# Patient Record
Sex: Female | Born: 1985
Health system: Southern US, Community
[De-identification: ages and names within clinical notes are randomized; demographics above are authoritative.]

## PROBLEM LIST (undated history)

## (undated) DIAGNOSIS — G8929 Other chronic pain: Secondary | ICD-10-CM

## (undated) DIAGNOSIS — M797 Fibromyalgia: Secondary | ICD-10-CM

## (undated) DIAGNOSIS — B999 Unspecified infectious disease: Secondary | ICD-10-CM

## (undated) DIAGNOSIS — I10 Essential (primary) hypertension: Secondary | ICD-10-CM

## (undated) DIAGNOSIS — F319 Bipolar disorder, unspecified: Secondary | ICD-10-CM

## (undated) DIAGNOSIS — F32A Depression, unspecified: Secondary | ICD-10-CM

## (undated) DIAGNOSIS — IMO0002 Reserved for concepts with insufficient information to code with codable children: Secondary | ICD-10-CM

## (undated) DIAGNOSIS — M329 Systemic lupus erythematosus, unspecified: Secondary | ICD-10-CM

## (undated) DIAGNOSIS — M1990 Unspecified osteoarthritis, unspecified site: Secondary | ICD-10-CM

## (undated) DIAGNOSIS — D709 Neutropenia, unspecified: Secondary | ICD-10-CM

## (undated) DIAGNOSIS — F419 Anxiety disorder, unspecified: Secondary | ICD-10-CM

## (undated) DIAGNOSIS — F1120 Opioid dependence, uncomplicated: Secondary | ICD-10-CM

## (undated) HISTORY — DX: Opioid dependence, uncomplicated: F11.20

## (undated) HISTORY — DX: Bipolar disorder, unspecified: F31.9

## (undated) HISTORY — PX: WISDOM TOOTH EXTRACTION: SHX21

---

## 2012-09-15 ENCOUNTER — Emergency Department (HOSPITAL_COMMUNITY): Payer: Self-pay

## 2012-09-15 ENCOUNTER — Emergency Department (HOSPITAL_COMMUNITY)
Admission: EM | Admit: 2012-09-15 | Discharge: 2012-09-15 | Disposition: A | Discharge status: Home / Self Care | Payer: Self-pay | Attending: Emergency Medicine | Admitting: Emergency Medicine

## 2012-09-15 ENCOUNTER — Encounter (HOSPITAL_COMMUNITY): Payer: Self-pay | Admitting: *Deleted

## 2012-09-15 DIAGNOSIS — Z8739 Personal history of other diseases of the musculoskeletal system and connective tissue: Secondary | ICD-10-CM | POA: Insufficient documentation

## 2012-09-15 DIAGNOSIS — R21 Rash and other nonspecific skin eruption: Secondary | ICD-10-CM | POA: Insufficient documentation

## 2012-09-15 DIAGNOSIS — Z87891 Personal history of nicotine dependence: Secondary | ICD-10-CM | POA: Insufficient documentation

## 2012-09-15 DIAGNOSIS — M329 Systemic lupus erythematosus, unspecified: Secondary | ICD-10-CM | POA: Insufficient documentation

## 2012-09-15 HISTORY — DX: Reserved for concepts with insufficient information to code with codable children: IMO0002

## 2012-09-15 HISTORY — DX: Systemic lupus erythematosus, unspecified: M32.9

## 2012-09-15 HISTORY — DX: Unspecified osteoarthritis, unspecified site: M19.90

## 2012-09-15 LAB — URINALYSIS, ROUTINE W REFLEX MICROSCOPIC
Bilirubin Urine: NEGATIVE
Glucose, UA: NEGATIVE mg/dL
Hgb urine dipstick: NEGATIVE
Ketones, ur: NEGATIVE mg/dL
Leukocytes, UA: NEGATIVE
Nitrite: NEGATIVE
Protein, ur: NEGATIVE mg/dL
Specific Gravity, Urine: >1.03 — ABNORMAL HIGH (ref 1.005–1.030)
Urobilinogen, UA: 0.2 mg/dL (ref 0.0–1.0)
pH: 7 (ref 5.0–8.0)

## 2012-09-15 LAB — BASIC METABOLIC PANEL
BUN: 13 mg/dL (ref 6–23)
CO2: 27 mEq/L (ref 19–32)
Calcium: 10.1 mg/dL (ref 8.4–10.5)
Chloride: 100 mEq/L (ref 96–112)
Creatinine, Ser: 0.7 mg/dL (ref 0.50–1.10)
GFR calc Af Amer: >90 mL/min (ref >90)
GFR calc non Af Amer: >90 mL/min (ref >90)
Glucose, Bld: 113 mg/dL — ABNORMAL HIGH (ref 70–99)
Potassium: 3.8 mEq/L (ref 3.5–5.1)
Sodium: 138 mEq/L (ref 135–145)

## 2012-09-15 LAB — LIPASE, BLOOD: Lipase: 21 U/L (ref 11–59)

## 2012-09-15 LAB — CBC WITH DIFFERENTIAL/PLATELET
Basophils Absolute: 0 10*3/uL (ref 0.0–0.1)
Basophils Relative: 0 % (ref 0–1)
Eosinophils Absolute: 0 10*3/uL (ref 0.0–0.7)
Eosinophils Relative: 0 % (ref 0–5)
HCT: 41.9 % (ref 36.0–46.0)
Hemoglobin: 14.2 g/dL (ref 12.0–15.0)
Lymphocytes Relative: 32 % (ref 12–46)
Lymphs Abs: 0.6 10*3/uL — ABNORMAL LOW (ref 0.7–4.0)
MCH: 29 pg (ref 26.0–34.0)
MCHC: 33.9 g/dL (ref 30.0–36.0)
MCV: 85.7 fL (ref 78.0–100.0)
Monocytes Absolute: 0.3 10*3/uL (ref 0.1–1.0)
Monocytes Relative: 14 % — ABNORMAL HIGH (ref 3–12)
Neutro Abs: 1 10*3/uL — ABNORMAL LOW (ref 1.7–7.7)
Neutrophils Relative %: 54 % (ref 43–77)
Platelets: 232 10*3/uL (ref 150–400)
RBC: 4.89 MIL/uL (ref 3.87–5.11)
RDW: 12.2 % (ref 11.5–15.5)
WBC: 1.9 10*3/uL — ABNORMAL LOW (ref 4.0–10.5)

## 2012-09-15 LAB — PREGNANCY, URINE: Preg Test, Ur: NEGATIVE

## 2012-09-15 MED ORDER — PANTOPRAZOLE SODIUM 40 MG PO TBEC
40.0000 mg | DELAYED_RELEASE_TABLET | Freq: Once | ORAL | Status: AC
  Administered 2012-09-15: 40 mg via ORAL
  Filled 2012-09-15: qty 1

## 2012-09-15 MED ORDER — METHYLPREDNISOLONE SODIUM SUCC 125 MG IJ SOLR
125.0000 mg | Freq: Once | INTRAMUSCULAR | Status: AC
  Administered 2012-09-15: 125 mg via INTRAVENOUS
  Filled 2012-09-15: qty 2

## 2012-09-15 MED ORDER — SODIUM CHLORIDE 0.9 % IV BOLUS (SEPSIS)
1000.0000 mL | Freq: Once | INTRAVENOUS | Status: AC
  Administered 2012-09-15: 1000 mL via INTRAVENOUS

## 2012-09-15 MED ORDER — OXYCODONE-ACETAMINOPHEN 5-325 MG PO TABS
2.0000 | ORAL_TABLET | Freq: Once | ORAL | Status: AC
  Administered 2012-09-15: 2 via ORAL
  Filled 2012-09-15: qty 2

## 2012-09-15 MED ORDER — PREDNISONE 10 MG PO TABS: 20.0000 mg | ORAL_TABLET | Freq: Every day | ORAL | Status: DC

## 2012-09-15 NOTE — ED Notes (Signed)
Pt c/o flare up of lupus. Pt states she has been out of her meds for a while and this has come on quickly. Pts face swollen with red areas to face. Pt c/o severe pain all over.

## 2012-09-15 NOTE — ED Provider Notes (Signed)
History     CSN: [id number]  Arrival date & time 09/15/12  4098   First MD Initiated Contact with Patient 09/15/12 1929      Chief Complaint  Patient presents with  . Lupus    (Consider location/radiation/quality/duration/timing/severity/associated sxs/prior treatment) HPI Patient with rash to face, hands, knuckles swollen, alopecia at baseline when not taking meds.  Patient states she has not been taking prednisone and plaquenil at full dose for a week and now out of meds for two days.  Patient from out of town and has been living in [place].  She denies fever, cough, abdominal pain, nausea vomiting or diarrhea.  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  Normal menses with last normal October.  Denies birth control or sexual activity since unknown.  Patient states she has some dyspnea, joints all swollen, and chills and facial rash and hand rash worse.  Patient diagnosed with lupus in 2006.   Past Medical History  Diagnosis Date  . Lupus   . Arthritis     Past Surgical History  Procedure Date  . Cesarean section     History reviewed. No pertinent family history.  History  Substance Use Topics  . Smoking status: Former Games developer  . Smokeless tobacco: Not on file  . Alcohol Use: No    OB History    Grav Para Term Preterm Abortions TAB SAB Ect Mult Living                  Review of Systems  Constitutional: Positive for chills. Negative for fever, activity change, appetite change and unexpected weight change.  HENT: Negative for sore throat, rhinorrhea, neck pain, neck stiffness and sinus pressure.   Eyes: Negative for visual disturbance.  Respiratory: Positive for shortness of breath. Negative for cough.   Cardiovascular: Negative for chest pain and leg swelling.  Gastrointestinal: Negative for vomiting, abdominal pain, diarrhea and blood in stool.  Genitourinary: Negative for dysuria, urgency, frequency, vaginal discharge and difficulty urinating.  Musculoskeletal:  Positive for joint swelling. Negative for myalgias, arthralgias and gait problem.  Skin: Positive for rash. Negative for color change.  Neurological: Negative for weakness, light-headedness and headaches.  Hematological: Does not bruise/bleed easily.  Psychiatric/Behavioral: Negative for dysphoric mood.    Allergies  Sulfa antibiotics  Home Medications  No current outpatient prescriptions on file.  BP 167/81  Pulse 111  Temp 98.7 F (37.1 C) (Oral)  Resp 16  Ht 5\' 3"  (1.6 m)  Wt 130 lb (58.968 kg)  BMI 23.03 kg/m2  SpO2 100%  LMP 07/30/2012  Physical Exam  Nursing note and vitals reviewed. Constitutional: She appears well-developed and well-nourished.  HENT:  Head: Normocephalic and atraumatic.  Eyes: Conjunctivae normal and EOM are normal. Pupils are equal, round, and reactive to light.  Neck: Normal range of motion. Neck supple.  Cardiovascular: Normal rate, regular rhythm, normal heart sounds and intact distal pulses.   Pulmonary/Chest: Effort normal and breath sounds normal.  Abdominal: Soft. Bowel sounds are normal.  Musculoskeletal: Normal range of motion.  Neurological: She is alert.  Skin: Skin is warm and dry.       Erythematous facial rash across nose bilateral cheeks with some central clearing on the plaque the left cheek.  Psychiatric: She has a normal mood and affect. Thought content normal.    ED Course  Procedures (including critical care time)  Labs Reviewed - No data to display No results found.   No diagnosis found.  Results for orders placed during  the hospital encounter of 09/15/12  CBC WITH DIFFERENTIAL      Component Value Range   WBC 1.9 (*) 4.0 - 10.5 K/uL   RBC 4.89  3.87 - 5.11 MIL/uL   Hemoglobin 14.2  12.0 - 15.0 g/dL   HCT 16.1  09.6 - 04.5 %   MCV 85.7  78.0 - 100.0 fL   MCH 29.0  26.0 - 34.0 pg   MCHC 33.9  30.0 - 36.0 g/dL   RDW 40.9  81.1 - 91.4 %   Platelets 232  150 - 400 K/uL   Neutrophils Relative 54  43 - 77 %    Neutro Abs 1.0 (*) 1.7 - 7.7 K/uL   Lymphocytes Relative 32  12 - 46 %   Lymphs Abs 0.6 (*) 0.7 - 4.0 K/uL   Monocytes Relative 14 (*) 3 - 12 %   Monocytes Absolute 0.3  0.1 - 1.0 K/uL   Eosinophils Relative 0  0 - 5 %   Eosinophils Absolute 0.0  0.0 - 0.7 K/uL   Basophils Relative 0  0 - 1 %   Basophils Absolute 0.0  0.0 - 0.1 K/uL  BASIC METABOLIC PANEL      Component Value Range   Sodium 138  135 - 145 mEq/L   Potassium 3.8  3.5 - 5.1 mEq/L   Chloride 100  96 - 112 mEq/L   CO2 27  19 - 32 mEq/L   Glucose, Bld 113 (*) 70 - 99 mg/dL   BUN 13  6 - 23 mg/dL   Creatinine, Ser 7.82  0.50 - 1.10 mg/dL   Calcium 95.6  8.4 - 21.3 mg/dL   GFR calc non Af Amer >90  >90 mL/min   GFR calc Af Amer >90  >90 mL/min  URINALYSIS, ROUTINE W REFLEX MICROSCOPIC      Component Value Range   Color, Urine YELLOW  YELLOW   APPearance CLEAR  CLEAR   Specific Gravity, Urine >1.030 (*) 1.005 - 1.030   pH 7.0  5.0 - 8.0   Glucose, UA NEGATIVE  NEGATIVE mg/dL   Hgb urine dipstick NEGATIVE  NEGATIVE   Bilirubin Urine NEGATIVE  NEGATIVE   Ketones, ur NEGATIVE  NEGATIVE mg/dL   Protein, ur NEGATIVE  NEGATIVE mg/dL   Urobilinogen, UA 0.2  0.0 - 1.0 mg/dL   Nitrite NEGATIVE  NEGATIVE   Leukocytes, UA NEGATIVE  NEGATIVE  PREGNANCY, URINE      Component Value Range   Preg Test, Ur NEGATIVE  NEGATIVE  LIPASE, BLOOD      Component Value Range   Lipase 21  11 - 59 U/L     MDM  Lupus with no medication for several days and partial therapy for weeks prior.  She has decreased wbc which I suspect is secondary to plaquenil ,but adequate neutrophil count.  She is restarted on prednisone but plaquenil not restarted until assured pmd will be able to monitor cbc.  Patient referred to Dr. Lodema Hong, on call for medicine.          Hilario Quarry, MD 09/15/12 2135

## 2012-11-28 ENCOUNTER — Other Ambulatory Visit (HOSPITAL_COMMUNITY): Payer: Self-pay | Admitting: Nurse Practitioner

## 2012-11-28 DIAGNOSIS — N6489 Other specified disorders of breast: Secondary | ICD-10-CM

## 2012-11-28 DIAGNOSIS — N9489 Other specified conditions associated with female genital organs and menstrual cycle: Secondary | ICD-10-CM

## 2012-11-28 DIAGNOSIS — R590 Localized enlarged lymph nodes: Secondary | ICD-10-CM

## 2012-12-05 ENCOUNTER — Ambulatory Visit (HOSPITAL_COMMUNITY)
Admission: RE | Admit: 2012-12-05 | Discharge: 2012-12-05 | Disposition: A | Discharge status: Home / Self Care | Payer: Self-pay | Source: Ambulatory Visit | Attending: Family Medicine | Admitting: Family Medicine

## 2012-12-05 ENCOUNTER — Ambulatory Visit (HOSPITAL_COMMUNITY): Payer: Self-pay

## 2012-12-05 DIAGNOSIS — N926 Irregular menstruation, unspecified: Secondary | ICD-10-CM | POA: Insufficient documentation

## 2012-12-05 DIAGNOSIS — N949 Unspecified condition associated with female genital organs and menstrual cycle: Secondary | ICD-10-CM | POA: Insufficient documentation

## 2012-12-05 DIAGNOSIS — N9489 Other specified conditions associated with female genital organs and menstrual cycle: Secondary | ICD-10-CM

## 2012-12-05 DIAGNOSIS — R590 Localized enlarged lymph nodes: Secondary | ICD-10-CM

## 2012-12-05 DIAGNOSIS — R599 Enlarged lymph nodes, unspecified: Secondary | ICD-10-CM | POA: Insufficient documentation

## 2012-12-05 DIAGNOSIS — N6489 Other specified disorders of breast: Secondary | ICD-10-CM | POA: Insufficient documentation

## 2013-04-04 ENCOUNTER — Encounter (HOSPITAL_COMMUNITY): Payer: Self-pay | Admitting: Emergency Medicine

## 2013-04-04 ENCOUNTER — Emergency Department (HOSPITAL_COMMUNITY)
Admission: EM | Admit: 2013-04-04 | Discharge: 2013-04-05 | Disposition: A | Discharge status: Home / Self Care | Payer: Managed Care, Other (non HMO) | Attending: Emergency Medicine | Admitting: Emergency Medicine

## 2013-04-04 DIAGNOSIS — D709 Neutropenia, unspecified: Secondary | ICD-10-CM | POA: Insufficient documentation

## 2013-04-04 DIAGNOSIS — R21 Rash and other nonspecific skin eruption: Secondary | ICD-10-CM | POA: Insufficient documentation

## 2013-04-04 DIAGNOSIS — M2550 Pain in unspecified joint: Secondary | ICD-10-CM | POA: Insufficient documentation

## 2013-04-04 DIAGNOSIS — M542 Cervicalgia: Secondary | ICD-10-CM | POA: Insufficient documentation

## 2013-04-04 DIAGNOSIS — G8929 Other chronic pain: Secondary | ICD-10-CM | POA: Insufficient documentation

## 2013-04-04 DIAGNOSIS — Z3202 Encounter for pregnancy test, result negative: Secondary | ICD-10-CM | POA: Insufficient documentation

## 2013-04-04 DIAGNOSIS — Z8739 Personal history of other diseases of the musculoskeletal system and connective tissue: Secondary | ICD-10-CM | POA: Insufficient documentation

## 2013-04-04 DIAGNOSIS — Z79899 Other long term (current) drug therapy: Secondary | ICD-10-CM | POA: Insufficient documentation

## 2013-04-04 DIAGNOSIS — Z87891 Personal history of nicotine dependence: Secondary | ICD-10-CM | POA: Insufficient documentation

## 2013-04-04 DIAGNOSIS — M329 Systemic lupus erythematosus, unspecified: Secondary | ICD-10-CM | POA: Insufficient documentation

## 2013-04-04 LAB — COMPREHENSIVE METABOLIC PANEL
ALT: 20 U/L (ref 0–35)
AST: 25 U/L (ref 0–37)
Albumin: 3.4 g/dL — ABNORMAL LOW (ref 3.5–5.2)
Alkaline Phosphatase: 40 U/L (ref 39–117)
BUN: 17 mg/dL (ref 6–23)
CO2: 26 mEq/L (ref 19–32)
Calcium: 8.4 mg/dL (ref 8.4–10.5)
Chloride: 109 mEq/L (ref 96–112)
Creatinine, Ser: 0.5 mg/dL (ref 0.50–1.10)
GFR calc Af Amer: >90 mL/min (ref >90)
GFR calc non Af Amer: >90 mL/min (ref >90)
Glucose, Bld: 87 mg/dL (ref 70–99)
Potassium: 3.2 mEq/L — ABNORMAL LOW (ref 3.5–5.1)
Sodium: 141 mEq/L (ref 135–145)
Total Bilirubin: 0.2 mg/dL — ABNORMAL LOW (ref 0.3–1.2)
Total Protein: 6.1 g/dL (ref 6.0–8.3)

## 2013-04-04 LAB — CBC WITH DIFFERENTIAL/PLATELET
Basophils Absolute: 0 10*3/uL (ref 0.0–0.1)
Basophils Relative: 1 % (ref 0–1)
Eosinophils Absolute: 0 10*3/uL (ref 0.0–0.7)
Eosinophils Relative: 1 % (ref 0–5)
HCT: 34.6 % — ABNORMAL LOW (ref 36.0–46.0)
Hemoglobin: 11.4 g/dL — ABNORMAL LOW (ref 12.0–15.0)
Lymphocytes Relative: 52 % — ABNORMAL HIGH (ref 12–46)
Lymphs Abs: 0.9 10*3/uL (ref 0.7–4.0)
MCH: 28.4 pg (ref 26.0–34.0)
MCHC: 32.9 g/dL (ref 30.0–36.0)
MCV: 86.3 fL (ref 78.0–100.0)
Monocytes Absolute: 0.3 10*3/uL (ref 0.1–1.0)
Monocytes Relative: 19 % — ABNORMAL HIGH (ref 3–12)
Neutro Abs: 0.5 10*3/uL — ABNORMAL LOW (ref 1.7–7.7)
Neutrophils Relative %: 27 % — ABNORMAL LOW (ref 43–77)
Platelets: 155 10*3/uL (ref 150–400)
RBC: 4.01 MIL/uL (ref 3.87–5.11)
RDW: 12.8 % (ref 11.5–15.5)
WBC: 1.7 10*3/uL — ABNORMAL LOW (ref 4.0–10.5)

## 2013-04-04 LAB — URINALYSIS, ROUTINE W REFLEX MICROSCOPIC
Glucose, UA: NEGATIVE mg/dL
Hgb urine dipstick: NEGATIVE
Leukocytes, UA: NEGATIVE
Nitrite: NEGATIVE
Protein, ur: NEGATIVE mg/dL
Specific Gravity, Urine: 1.02 (ref 1.005–1.030)
Urobilinogen, UA: 0.2 mg/dL (ref 0.0–1.0)
pH: 7 (ref 5.0–8.0)

## 2013-04-04 LAB — LACTIC ACID, PLASMA: Lactic Acid, Venous: 0.5 mmol/L (ref 0.5–2.2)

## 2013-04-04 LAB — PREGNANCY, URINE: Preg Test, Ur: NEGATIVE

## 2013-04-04 MED ORDER — PREDNISONE 10 MG PO TABS
60.0000 mg | ORAL_TABLET | Freq: Once | ORAL | Status: AC
  Administered 2013-04-04: 60 mg via ORAL
  Filled 2013-04-04 (×2): qty 1

## 2013-04-04 MED ORDER — HYDROXYCHLOROQUINE SULFATE 200 MG PO TABS: 400.0000 mg | ORAL_TABLET | Freq: Every day | ORAL | Status: DC

## 2013-04-04 MED ORDER — SODIUM CHLORIDE 0.9 % IV SOLN
INTRAVENOUS | Status: DC
  Administered 2013-04-04: 23:00:00 via INTRAVENOUS

## 2013-04-04 MED ORDER — OXYCODONE-ACETAMINOPHEN 5-325 MG PO TABS: 1.0000 | ORAL_TABLET | Freq: Four times a day (QID) | ORAL | Status: DC | PRN

## 2013-04-04 MED ORDER — OXYCODONE-ACETAMINOPHEN 5-325 MG PO TABS
2.0000 | ORAL_TABLET | Freq: Once | ORAL | Status: AC
  Administered 2013-04-05: 2 via ORAL
  Filled 2013-04-04: qty 2

## 2013-04-04 MED ORDER — PREDNISONE 20 MG PO TABS: ORAL_TABLET | ORAL | Status: DC

## 2013-04-04 MED ORDER — SODIUM CHLORIDE 0.9 % IV BOLUS (SEPSIS)
2000.0000 mL | Freq: Once | INTRAVENOUS | Status: AC
  Administered 2013-04-04: 2000 mL via INTRAVENOUS

## 2013-04-04 MED ORDER — HYDROMORPHONE HCL PF 1 MG/ML IJ SOLN
1.0000 mg | Freq: Once | INTRAMUSCULAR | Status: AC
  Administered 2013-04-04: 1 mg via INTRAVENOUS
  Filled 2013-04-04 (×2): qty 1

## 2013-04-04 MED ORDER — FENTANYL CITRATE 0.05 MG/ML IJ SOLN
INTRAMUSCULAR | Status: AC
  Administered 2013-04-04: 50 ug
  Filled 2013-04-04: qty 2

## 2013-04-04 NOTE — ED Notes (Signed)
Patient crying and reporting severe pain in triage.

## 2013-04-04 NOTE — ED Notes (Signed)
Patient reports has history of lupus. States hasn't taken plaquenil in over a week. Complaining of generalized pain all over.

## 2013-04-04 NOTE — ED Provider Notes (Signed)
History  This chart was scribed for Hurman Horn, MD by Manuela Schwartz, ED scribe. This patient was seen in room [facility] and the patient's care was started at 2021.  CSN: [id number] Arrival date & time 04/04/13  2021  None    Chief Complaint  Patient presents with  . Joint Pain  . Lupus   The history is provided by the patient. No language interpreter was used.   HPI Comments: Wendy Herman is a 27 y.o. female who presents to the Emergency Department w/hx of lupus complaining of out of her normal daily plaquenil, steroid, and pain medicine and now with severe, diffuse joint pain and back/neck pain which is worse than baseline. She states this episode of her lupus flare up is similar to previous episodes w/diffuse joint and extremity pain. She normally takes plaquenil, prednisone, and pain medicine daily for her lupus. She currenlty has no pain management doctor. She states normally takes Vicodin or percocet to control her pain. She states an associated red rash over her face which is better to previous rashes. She states associated confusion and some difficulty with short term working memory but she is oriented. She denies associated fever, abdominal pain, CP, SOB, cough, bloody stools.    Past Medical History  Diagnosis Date  . Lupus   . Arthritis    Past Surgical History  Procedure Laterality Date  . Cesarean section     History reviewed. No pertinent family history. History  Substance Use Topics  . Smoking status: Former Games developer  . Smokeless tobacco: Not on file  . Alcohol Use: No   OB History   Grav Para Term Preterm Abortions TAB SAB Ect Mult Living                 Review of Systems  Constitutional: Negative for fever and chills.  HENT: Positive for neck pain. Negative for congestion, sore throat and rhinorrhea.   Respiratory: Negative for cough and shortness of breath.   Cardiovascular: Negative for chest pain.  Gastrointestinal: Negative for nausea, vomiting,  abdominal pain and diarrhea.  Musculoskeletal: Positive for back pain and joint swelling.  Skin: Positive for rash (Diffuse red rash over her face). Negative for color change.  Neurological: Negative for weakness.  All other systems reviewed and are negative.  A complete 10 system review of systems was obtained and all systems are negative except as noted in the HPI and PMH.    Allergies  Sulfa antibiotics  Home Medications   Current Outpatient Rx  Name  Route  Sig  Dispense  Refill  . alum & mag hydroxide-simeth (MAALOX/MYLANTA) 200-200-20 MG/5ML suspension   Oral   Take 15 mLs by mouth daily.         . predniSONE (DELTASONE) 10 MG tablet   Oral   Take 10-15 mg by mouth at bedtime.         . hydroxychloroquine (PLAQUENIL) 200 MG tablet   Oral   Take 400 mg by mouth daily.         . hydroxychloroquine (PLAQUENIL) 200 MG tablet   Oral   Take 2 tablets (400 mg total) by mouth daily.   30 tablet   0   . omeprazole (PRILOSEC) 20 MG capsule   Oral   Take 20 mg by mouth daily. Acid Reflux         . oxyCODONE-acetaminophen (PERCOCET) 5-325 MG per tablet   Oral   Take 1 tablet by mouth every 6 (six) hours  as needed for pain.   30 tablet   0   . predniSONE (DELTASONE) 20 MG tablet      2 tabs po daily x 6 days, then 1.5 tabs x 3 days, then 1 tab x 3 days, then 0.5 tabs daily   30 tablet   0    Triage Vitals: BP 121/101  Pulse 78  Temp(Src) 98.5 F (36.9 C) (Oral)  Resp 20  Ht 5\' 3"  (1.6 m)  Wt 120 lb (54.432 kg)  BMI 21.26 kg/m2  SpO2 98%  LMP 11/15/2012 Physical Exam  Nursing note and vitals reviewed. Constitutional: She is oriented to person, place, and time. She appears well-developed and well-nourished. No distress.  Awake, alert, nontoxic appearance.  HENT:  Head: Normocephalic and atraumatic.  Eyes: EOM are normal. Right eye exhibits no discharge. Left eye exhibits no discharge.  Neck: Neck supple. No tracheal deviation present.  Diffuse  cervical spine tenderness  Cardiovascular: Normal rate, regular rhythm and normal heart sounds.   No murmur heard. Pulmonary/Chest: Effort normal and breath sounds normal. No respiratory distress. She has no wheezes. She has no rales. She exhibits no tenderness.  Abdominal: Soft. Bowel sounds are normal. She exhibits no distension and no mass. There is no tenderness. There is no rebound and no guarding.  Musculoskeletal: Normal range of motion. She exhibits no tenderness.  Baseline ROM, no obvious new focal weakness. Diffuse back tenderness and diffuse tenderness to all extremities and joints.   Neurological: She is alert and oriented to person, place, and time.  Mental status and motor strength appears baseline for patient and situation.  Skin: Skin is warm and dry. No rash noted.  Psychiatric: She has a normal mood and affect. Her behavior is normal.    ED Course  Procedures (including critical care time) DIAGNOSTIC STUDIES: Oxygen Saturation is 98% on room air, normal by my interpretation.    COORDINATION OF CARE: At 1051 Discussed treatment plan with patient which includes IV fluids, pain medicine, blood work. Patient agrees.   D/w [facility] Rheum recs re-start Plaquenil and start taper prednisone at 40mg  taper to 10mg  daily by 2 weeks, re-start chronic narcotics, f/u [facility] Rheum within 2 weeks. Pt feels improved after observation and/or treatment in ED.Patient / Family / Caregiver informed of clinical course, understand medical decision-making process, and agree with plan.[id number]  Labs Reviewed  CBC WITH DIFFERENTIAL - Abnormal; Notable for the following:    WBC 1.7 (*)    Hemoglobin 11.4 (*)    HCT 34.6 (*)    Neutrophils Relative % 27 (*)    Lymphocytes Relative 52 (*)    Monocytes Relative 19 (*)    Neutro Abs 0.5 (*)    All other components within normal limits  COMPREHENSIVE METABOLIC PANEL - Abnormal; Notable for the following:    Potassium 3.2 (*)    Albumin 3.4 (*)    Total  Bilirubin 0.2 (*)    All other components within normal limits  URINALYSIS, ROUTINE W REFLEX MICROSCOPIC - Abnormal; Notable for the following:    Color, Urine BROWN (*)    Bilirubin Urine SMALL (*)    Ketones, ur TRACE (*)    All other components within normal limits  PREGNANCY, URINE  LACTIC ACID, PLASMA   No results found. 1. SLE exacerbation   2. Chronic pain   3. Neutropenia     MDM  I personally performed the services described in this documentation, which was scribed in my presence. The recorded information  has been reviewed and is accurate. I doubt any other EMC precluding discharge at this time including, but not necessarily limited to the following:SBI.   Hurman Horn, MD 04/05/13 [phone]

## 2013-04-04 NOTE — ED Notes (Signed)
Patient is very tearful and stating that she is in a lot of pain at this time.

## 2014-07-27 ENCOUNTER — Emergency Department (HOSPITAL_COMMUNITY)
Admission: EM | Admit: 2014-07-27 | Discharge: 2014-07-27 | Disposition: A | Discharge status: Home / Self Care | Payer: Medicaid Other | Attending: Emergency Medicine | Admitting: Emergency Medicine

## 2014-07-27 ENCOUNTER — Encounter (HOSPITAL_COMMUNITY): Payer: Self-pay | Admitting: Emergency Medicine

## 2014-07-27 DIAGNOSIS — F42 Obsessive-compulsive disorder: Secondary | ICD-10-CM | POA: Diagnosis not present

## 2014-07-27 DIAGNOSIS — D72819 Decreased white blood cell count, unspecified: Secondary | ICD-10-CM | POA: Insufficient documentation

## 2014-07-27 DIAGNOSIS — Z3202 Encounter for pregnancy test, result negative: Secondary | ICD-10-CM | POA: Diagnosis not present

## 2014-07-27 DIAGNOSIS — G8929 Other chronic pain: Secondary | ICD-10-CM | POA: Insufficient documentation

## 2014-07-27 DIAGNOSIS — Z7952 Long term (current) use of systemic steroids: Secondary | ICD-10-CM | POA: Insufficient documentation

## 2014-07-27 DIAGNOSIS — Z79899 Other long term (current) drug therapy: Secondary | ICD-10-CM | POA: Diagnosis not present

## 2014-07-27 DIAGNOSIS — Z87891 Personal history of nicotine dependence: Secondary | ICD-10-CM | POA: Insufficient documentation

## 2014-07-27 DIAGNOSIS — M1990 Unspecified osteoarthritis, unspecified site: Secondary | ICD-10-CM | POA: Insufficient documentation

## 2014-07-27 DIAGNOSIS — M329 Systemic lupus erythematosus, unspecified: Secondary | ICD-10-CM

## 2014-07-27 DIAGNOSIS — M799 Soft tissue disorder, unspecified: Secondary | ICD-10-CM | POA: Insufficient documentation

## 2014-07-27 DIAGNOSIS — M3210 Systemic lupus erythematosus, organ or system involvement unspecified: Secondary | ICD-10-CM | POA: Insufficient documentation

## 2014-07-27 DIAGNOSIS — M791 Myalgia: Secondary | ICD-10-CM | POA: Diagnosis not present

## 2014-07-27 DIAGNOSIS — R748 Abnormal levels of other serum enzymes: Secondary | ICD-10-CM

## 2014-07-27 HISTORY — DX: Fibromyalgia: M79.7

## 2014-07-27 HISTORY — DX: Neutropenia, unspecified: D70.9

## 2014-07-27 HISTORY — DX: Other chronic pain: G89.29

## 2014-07-27 LAB — COMPREHENSIVE METABOLIC PANEL
ALT: 45 U/L — ABNORMAL HIGH (ref 0–35)
AST: 38 U/L — ABNORMAL HIGH (ref 0–37)
Albumin: 3.8 g/dL (ref 3.5–5.2)
Alkaline Phosphatase: 60 U/L (ref 39–117)
Anion gap: 9 (ref 5–15)
BUN: 16 mg/dL (ref 6–23)
CO2: 31 mEq/L (ref 19–32)
Calcium: 9.3 mg/dL (ref 8.4–10.5)
Chloride: 101 mEq/L (ref 96–112)
Creatinine, Ser: 0.57 mg/dL (ref 0.50–1.10)
GFR calc Af Amer: >90 mL/min (ref >90)
GFR calc non Af Amer: >90 mL/min (ref >90)
Glucose, Bld: 92 mg/dL (ref 70–99)
Potassium: 4.3 mEq/L (ref 3.7–5.3)
Sodium: 141 mEq/L (ref 137–147)
Total Bilirubin: 0.3 mg/dL (ref 0.3–1.2)
Total Protein: 7.5 g/dL (ref 6.0–8.3)

## 2014-07-27 LAB — CBC WITH DIFFERENTIAL/PLATELET
Basophils Absolute: 0 10*3/uL (ref 0.0–0.1)
Basophils Relative: 0 % (ref 0–1)
Eosinophils Absolute: 0.1 10*3/uL (ref 0.0–0.7)
Eosinophils Relative: 4 % (ref 0–5)
HCT: 37.7 % (ref 36.0–46.0)
Hemoglobin: 12.4 g/dL (ref 12.0–15.0)
Lymphocytes Relative: 38 % (ref 12–46)
Lymphs Abs: 0.8 10*3/uL (ref 0.7–4.0)
MCH: 29.5 pg (ref 26.0–34.0)
MCHC: 32.9 g/dL (ref 30.0–36.0)
MCV: 89.5 fL (ref 78.0–100.0)
Monocytes Absolute: 0.3 10*3/uL (ref 0.1–1.0)
Monocytes Relative: 15 % — ABNORMAL HIGH (ref 3–12)
Neutro Abs: 0.9 10*3/uL — ABNORMAL LOW (ref 1.7–7.7)
Neutrophils Relative %: 43 % (ref 43–77)
Platelets: 211 10*3/uL (ref 150–400)
RBC: 4.21 MIL/uL (ref 3.87–5.11)
RDW: 14.7 % (ref 11.5–15.5)
WBC: 2.1 10*3/uL — ABNORMAL LOW (ref 4.0–10.5)

## 2014-07-27 LAB — URINALYSIS, ROUTINE W REFLEX MICROSCOPIC
Bilirubin Urine: NEGATIVE
Glucose, UA: NEGATIVE mg/dL
Hgb urine dipstick: NEGATIVE
Ketones, ur: NEGATIVE mg/dL
Leukocytes, UA: NEGATIVE
Nitrite: NEGATIVE
Protein, ur: NEGATIVE mg/dL
Specific Gravity, Urine: 1.02 (ref 1.005–1.030)
Urobilinogen, UA: 1 mg/dL (ref 0.0–1.0)
pH: 7 (ref 5.0–8.0)

## 2014-07-27 LAB — PREGNANCY, URINE: Preg Test, Ur: NEGATIVE

## 2014-07-27 MED ORDER — HYDROXYCHLOROQUINE SULFATE 200 MG PO TABS
400.0000 mg | ORAL_TABLET | Freq: Every day | ORAL | Status: DC
  Administered 2014-07-27: 400 mg via ORAL
  Filled 2014-07-27 (×3): qty 2

## 2014-07-27 MED ORDER — PREDNISONE 20 MG PO TABS: 40.0000 mg | ORAL_TABLET | Freq: Every day | ORAL | Status: DC

## 2014-07-27 MED ORDER — ALPRAZOLAM 0.5 MG PO TABS
1.0000 mg | ORAL_TABLET | Freq: Once | ORAL | Status: AC
  Administered 2014-07-27: 1 mg via ORAL
  Filled 2014-07-27: qty 2

## 2014-07-27 MED ORDER — OXYCODONE-ACETAMINOPHEN 5-325 MG PO TABS
2.0000 | ORAL_TABLET | Freq: Once | ORAL | Status: AC
  Administered 2014-07-27: 2 via ORAL
  Filled 2014-07-27: qty 2

## 2014-07-27 MED ORDER — HYDROXYCHLOROQUINE SULFATE 200 MG PO TABS
ORAL_TABLET | ORAL | Status: AC
  Filled 2014-07-27: qty 2

## 2014-07-27 MED ORDER — PREDNISONE 50 MG PO TABS
60.0000 mg | ORAL_TABLET | Freq: Once | ORAL | Status: AC
  Administered 2014-07-27: 60 mg via ORAL
  Filled 2014-07-27 (×2): qty 1

## 2014-07-27 MED ORDER — ALPRAZOLAM 1 MG PO TABS: 1.0000 mg | ORAL_TABLET | Freq: Every evening | ORAL | Status: DC | PRN

## 2014-07-27 MED ORDER — HYDROXYCHLOROQUINE SULFATE 200 MG PO TABS: 400.0000 mg | ORAL_TABLET | Freq: Every day | ORAL | Status: DC
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
## 2014-07-27 NOTE — Discharge Instructions (Signed)
Refills for prednisone and Plaquenil for one month.   White blood cell count was low [2.1], but this is not a new finding. Mild elevation of liver functions as follows: AST 38 and ALT 45.   Must get a primary care doctor or rheumatologist in the area. Information guide given.    Emergency Department Resource Guide 1) Find a Doctor and Pay Out of Pocket Although you won't have to find out who is covered by your insurance plan, it is a good idea to ask around and get recommendations. You will then need to call the office and see if the doctor you have chosen will accept you as a new patient and what types of options they offer for patients who are self-pay. Some doctors offer discounts or will set up payment plans for their patients who do not have insurance, but you will need to ask so you aren't surprised when you get to your appointment.  2) Contact Your Local Health Department Not all health departments have doctors that can see patients for sick visits, but many do, so it is worth a call to see if yours does. If you don't know where your local health department is, you can check in your phone book. The CDC also has a tool to help you locate your state's health department, and many state websites also have listings of all of their local health departments.  3) Find a Walk-in Clinic If your illness is not likely to be very severe or complicated, you may want to try a walk in clinic. These are popping up all over the country in pharmacies, drugstores, and shopping centers. They're usually staffed by nurse practitioners or physician assistants that have been trained to treat common illnesses and complaints. They're usually fairly quick and inexpensive. However, if you have serious medical issues or chronic medical problems, these are probably not your best option.  No Primary Care Doctor: - Call Health Connect at  [phone] - they can help you locate a primary care doctor that  accepts your insurance,  provides certain services, etc. - Physician Referral Service- [phone]  Chronic Pain Problems: Organization         Address  Phone   Notes  [facility]  [phone] Patients need to be referred by their primary care doctor.   Medication Assistance: Organization         Address  Phone   Notes  [facility] Medication [facility] [address] [place], [place] [address] [phone] --Must be a resident of [facility] -- Must have NO insurance coverage whatsoever (no Medicaid/ Medicare, etc.) -- The pt. MUST have a primary care doctor that directs their care regularly and follows them in the community   MedAssist  [phone]   [employer]  [phone]    Agencies that provide inexpensive medical care: Organization         Address  Phone   Notes  [facility]  [phone]   [facility]    [phone]   [facility] [address] [place], [place] [address] [phone]   [facility] [address] [place], [place] [phone]   Planned Parenthood    [phone]   [facility]    [phone]   [facility]  [address], [place] Phone:  [phone], Fax:  [phone] Hours of  Operation:  9 am - 6 pm, M-F.  Also accepts Medicaid/Medicare and self-pay.  [facility]  [address], [place] Phone: [phone], Fax: [phone]. Hours of Operation:  8:30 am - 5:30 pm, M-F.  Also accepts Medicaid and self-pay.  [facility] [place] [address], [place] Phone: [phone]) [phone]   [facility] [address], [place], [place] [phone], Ext. [phone] Mondays & Thursdays: 7-9 AM.  First 15 patients are seen on a first come, first serve basis.    Medicaid-accepting [facility] Providers:  Organization         Address  Phone    Notes  [facility] [address], [place] [phone] Also accepts self-pay patients.  [facility] [address], [place]  [phone](336) [phone]   [facility] [address], [place] [phone]) [phone]   [facility] [address] [place] [address], [place] [phone]) [phone]   Renaye RakersVeita Bland [address], [place]   [phone]) [phone] Only accepts [place] Access [place] patients after they have their name applied to their card.   Self-Pay (no insurance) in [place]:  Organization         Address  Phone   Notes  Sickle Cell Patients, [facility] [address], [place] [phone]   [facility] [address], [place] [phone]   [facility] [place]  [address], [place] [phone]   [facility]/Dr. Osei-Bonsu  [address] [place] [address], [place] or [phone], [place] [phone] Phone number for both [place] and [place] locations is the same.  [facility] [address], [place] [phone](336) [phone]   [facility]  [address] [place] [address], [place] or [address] [phone]) [phone]   [facility] [address], [place] [phone]) [phone], phone; [phone], fax Sees patients 1st and 3rd Saturday of every month.  Must not qualify for public or private insurance (i.e. Medicaid, Medicare, [place] Health Choice, Veterans' Benefits)  Household income should be no more than 200% of the poverty level The clinic cannot treat you if you are pregnant or think you are pregnant  Sexually transmitted diseases are not treated at the clinic.    Dental Care: Organization         Address  Phone  Notes  [facility] Department of [facility] [address], [place] [phone]  [phone] Accepts children up to age 27 who are enrolled in [place] or [place] Health Choice; pregnant women with a Medicaid card; and children who have applied for Medicaid or [place] Health Choice, but were declined, whose parents can pay a reduced fee at time of service.  [facility] of [facility] [place]  [address], [place] [phone]) [phone] Accepts children up to age 721 who are enrolled in [place] or [place] Health Choice; pregnant women with a Medicaid card; and children who have applied for Medicaid or [place] Health Choice, but were declined, whose parents can pay a reduced fee at time of service.  [facility]  [address], [place] [phone] Patients are seen by appointment only. Walk-ins are not accepted. [facility] will see patients  27 years of age and older. Monday - Tuesday (8am-5pm) Most Wednesdays (8:30-5pm) $30 per visit, cash only  [facility]  [address], [facility] [phone] Patients are seen by appointment only. Walk-ins are not accepted. [facility] will see patients 27 years of age and older. One Wednesday Evening (Monthly: Volunteer Based).  $30 per visit, cash only  [employer] [facility]  [phone] for adults; Children under age 694, call Graduate Pediatric Dentistry at [phone]. Children aged 554-14, please call [phone] to request a pediatric application.  Dental services are provided in all areas of dental care including fillings, crowns and bridges, complete and partial dentures, implants, gum treatment, root canals, and extractions. Preventive care is also provided. Treatment is provided to both adults and children. Patients are selected via a lottery and there is often a waiting list.   [facility] [address], [place]  [phone] [email or web address]   [facility] [address], [place], [place] [phone], Ext.  123 Second and Fourth Thursday of each month, opens at 6:30 AM; Clinic ends at 9 AM.  Patients are seen on a first-come first-served basis, and a limited number are seen during each clinic.   [facility]  [address], [place], [place] [phone]) [phone]   Eligibility Requirements You must have lived in [place], [place], or [place] counties for at least the last three months.   You cannot be eligible for state or federal sponsored National Cityhealthcare insurance, including CIGNAVeterans Administration, [place]icaid, or [employer]Medicare.   You generally cannot be eligible for healthcare insurance through your employer.    How to apply: Eligibility screenings are held every Tuesday and Wednesday afternoon from 1:00 pm until 4:00 pm. You do not need an appointment for the interview!  [facility] [address], [place], [place] [phone]   [facility]  [phone]   [facility]  [phone]   [facility]  [phone]    Behavioral Health Resources in the Community: Intensive Outpatient Programs Organization         Address  Phone  Notes  [facility] [address], [place], [place] [phone]   [facility] [address], [place], [place] [phone]   [facility] [address], [place], [place]  [phone]   [facility] [address],  [place], [place] [phone] or [phone]   Substance Abuse Resources Organization         Address  Phone  Notes  [facility]  [phone]   [facility]  [phone]   [facility]  [phone]   [facility]  [phone]   Residential & Outpatient Substance Abuse Program  [phone]   Psychological Services Organization         Address  Phone  Notes  [facility]  [phone]   [facility]  [phone]- [phone]   [facility]  Mental Health [address], [place] [phone] or [phone]    Mobile Crisis Teams Organization         Address  Phone  Notes  [facility], Mobile Crisis Care Unit  [phone]   [facility]  [address] [place], [place] [phone]   [facility] [address] [place] [phone]    Self-Help/Support Groups Organization  Address  Phone             Notes  Mental Health Assoc. of  - variety of support groups  [phone]- I7437963 Call for more information  Narcotics Anonymous (NA), [facility] [address], [employer] [place]  2 meetings at this location   Statistician         Address  Phone  Notes  [facility] [address],    [place]  [phone]   [facility]  [address] [place] [address], [place], [place] [phone]   [facility] [address] [place], [place] [phone] Admissions: 8am-3pm M-F  [facility] [address]    [place], [place] [phone]   [facility] [address] [place], [place], [place] [phone]   [facility] [address]  [place], [place] [phone]   [facility] - Intensive Outpatient [address], [place], [place] [phone]   [facility] ([facility] [address]  [place], [place] [phone] or [phone]   [facility] (RTS) [address], [place], [place] [phone] Accepts Medicaid  [facility] [place] [address],  [place] [phone] Substance Abuse/Addiction Treatment   [facility] Organization         Address  Phone  Notes  [facility]  [phone]   Angie Fava, PhD [address] [place], [place]   [phone] or [phone]   [facility] Behavioral   [address] [place], [place] [phone]   [facility] [address] [place], [place] [phone] Insurance/Medicaid/sponsorship through [facility] and Families [address]                                    [place], [place] [phone] Therapy/tele-psych/case  [facility] [address], [place] [phone]    Dr. Lolly Mustache  [phone]   [facility]  [employer] [facility] Dept. 1) [address] [place] 2) [address] [place] 3)  [address], [place] [phone]  [phone]   [facility] Child Abuse Hotline [phone] or [phone] (After Hours)

## 2014-07-27 NOTE — ED Provider Notes (Signed)
CSN: [id number]     Arrival date & time 07/27/14  1425 History  This chart was scribed for Donnetta HutchingBrian Lular Letson, MD by Richarda Overlieichard Holland, ED Scribe. This patient was seen in room APA14/APA14 and the patient's care was started 4:28 PM.    Chief Complaint  Patient presents with  . Lupus   HPI HPI Comments: Wendy Herman is a 28 y.o. female with a history of chronic lupus and fibromyalgia who presents to the Emergency Department complaining of a lupus flare up in the past couple months. She reports associated constant, gradually worsening generalized body pain and states her skin hurts. Pt reports associated, worsening mental changes that she describes as OCD. Pt came to visit her grandparents in [place] due to death in the family. Dr. Clelia CroftShaw in [place] has been reflilling her medications here but she says she has not had any of her medications this week because she needed bloodwork done before she could get her medications refilled. She reports she has not had her medications in 5 days which has caused her symptoms to worsens. Pt states she takes 40mg  Prednisone/day, 400mg  Plaquenil/day, and takes Lyrica and Imuran daily. Pt reports she has not had menses in over a year.     Past Medical History  Diagnosis Date  . Lupus   . Arthritis   . Fibromyalgia   . Chronic pain   . Neutropenia    Past Surgical History  Procedure Laterality Date  . Cesarean section     History reviewed. No pertinent family history. History  Substance Use Topics  . Smoking status: Former Games developermoker  . Smokeless tobacco: Not on file  . Alcohol Use: No   OB History   Grav Para Term Preterm Abortions TAB SAB Ect Mult Living                 Review of Systems  Musculoskeletal: Positive for myalgias.  Skin: Positive for rash.  All other systems reviewed and are negative.   Allergies  Sulfa antibiotics  Home Medications   Prior to Admission medications   Medication Sig Start Date End Date Taking? Authorizing Provider  ALPRAZolam  Prudy Feeler(XANAX) 1 MG tablet Take 0.5-1 mg by mouth daily as needed for anxiety.   Yes Historical Provider, MD  azaTHIOprine (IMURAN) 50 MG tablet Take 50-100 mg by mouth 2 (two) times daily. Takes 100 mg in the morning and 50 mg in the evening.   Yes Historical Provider, MD  Calcium Carb-Cholecalciferol (CALCIUM 600 + D PO) Take 1 tablet by mouth daily.   Yes Historical Provider, MD  hydroxychloroquine (PLAQUENIL) 200 MG tablet Take 400 mg by mouth daily. 04/04/13  Yes Hurman HornJohn M Bednar, MD  Multiple Vitamin (MULTIVITAMIN WITH MINERALS) TABS tablet Take 1 tablet by mouth daily.   Yes Historical Provider, MD  Omega-3 Fatty Acids (FISH OIL) 1000 MG CAPS Take 1,000 mg by mouth daily.   Yes Historical Provider, MD  omeprazole (PRILOSEC) 20 MG capsule Take 40 mg by mouth daily. Acid Reflux   Yes Historical Provider, MD  predniSONE (DELTASONE) 20 MG tablet Take 40 mg by mouth daily with breakfast.   Yes Historical Provider, MD  pregabalin (LYRICA) 150 MG capsule Take 150 mg by mouth 2 (two) times daily.   Yes Historical Provider, MD  tetrahydrozoline 0.05 % ophthalmic solution Place 1 drop into both eyes daily.   Yes Historical Provider, MD  hydroxychloroquine (PLAQUENIL) 200 MG tablet Take 2 tablets (400 mg total) by mouth daily. 07/27/14  Donnetta HutchingBrian Austina Constantin, MD  predniSONE (DELTASONE) 20 MG tablet Take 2 tablets (40 mg total) by mouth daily with breakfast. 07/27/14   Donnetta HutchingBrian Khamani Fairley, MD   BP 131/102  Pulse 92  Temp(Src) 99.8 F (37.7 C) (Oral)  Resp 16  Ht 5\' 4"  (1.626 m)  Wt 111 lb 5.3 oz (50.5 kg)  BMI 19.10 kg/m2  SpO2 100% Physical Exam  Nursing note and vitals reviewed. Constitutional: She is oriented to person, place, and time. She appears well-developed and well-nourished.  HENT:  Head: Normocephalic and atraumatic.  Eyes: Conjunctivae and EOM are normal. Pupils are equal, round, and reactive to light.  Neck: Normal range of motion. Neck supple.  Cardiovascular: Normal rate, regular rhythm and normal heart  sounds.   Pulmonary/Chest: Effort normal and breath sounds normal.  Abdominal: Soft. Bowel sounds are normal.  Musculoskeletal: Normal range of motion.  Neurological: She is alert and oriented to person, place, and time.  Skin: Skin is warm and dry.  Diffuse scaling excoriations, ulcerations. Severe scalp alopecia   Psychiatric: She has a normal mood and affect. Her behavior is normal.    ED Course  Procedures  DIAGNOSTIC STUDIES: Oxygen Saturation is 100% on RA, normal by my interpretation.    COORDINATION OF CARE: 4:40 PM Discussed treatment plan with pt at bedside and pt agreed to plan.   Labs Review Labs Reviewed  CBC WITH DIFFERENTIAL - Abnormal; Notable for the following:    WBC 2.1 (*)    Neutro Abs 0.9 (*)    Monocytes Relative 15 (*)    All other components within normal limits  COMPREHENSIVE METABOLIC PANEL - Abnormal; Notable for the following:    AST 38 (*)    ALT 45 (*)    All other components within normal limits  URINALYSIS, ROUTINE W REFLEX MICROSCOPIC  PREGNANCY, URINE    Imaging Review No results found.   EKG Interpretation None      MDM   Final diagnoses:  SLE (systemic lupus erythematosus)  Leukopenia  Elevated liver enzymes   Discussed findings of tests with patient and her mother. Behavioral health consultation obtained. Prescriptions for prednisone 40 mg daily and Plaquenil 400 mg daily. No neurological deficits.   I personally performed the services described in this documentation, which was scribed in my presence. The recorded information has been reviewed and is accurate.     Donnetta HutchingBrian Corwyn Vora, MD 07/27/14 2033

## 2014-07-27 NOTE — ED Notes (Signed)
Pt with lupus flare up for months and has not had prednisone in several days, [place] had a doctor refilling her meds for months but refused since pt needed lab work done

## 2014-07-27 NOTE — BH Assessment (Signed)
[facility] Assessment Progress Note  Called [facility] and spoke with Dr. Adriana [place], took history of pt, made appt for teleassessment for 5:45 PM.

## 2018-03-28 ENCOUNTER — Emergency Department (HOSPITAL_COMMUNITY)
Admission: EM | Admit: 2018-03-28 | Discharge: 2018-03-28 | Discharge status: Absent without leave | Payer: Managed Care, Other (non HMO)

## 2018-08-26 ENCOUNTER — Emergency Department (HOSPITAL_COMMUNITY): Payer: Managed Care, Other (non HMO)

## 2018-08-26 ENCOUNTER — Encounter (HOSPITAL_COMMUNITY): Payer: Self-pay | Admitting: Emergency Medicine

## 2018-08-26 ENCOUNTER — Observation Stay (HOSPITAL_COMMUNITY)
Admission: EM | Admit: 2018-08-26 | Discharge: 2018-08-27 | Disposition: A | Discharge status: Home / Self Care | Payer: Managed Care, Other (non HMO) | Attending: Internal Medicine | Admitting: Internal Medicine

## 2018-08-26 ENCOUNTER — Other Ambulatory Visit: Payer: Self-pay

## 2018-08-26 DIAGNOSIS — IMO0002 Reserved for concepts with insufficient information to code with codable children: Secondary | ICD-10-CM

## 2018-08-26 DIAGNOSIS — D649 Anemia, unspecified: Secondary | ICD-10-CM | POA: Diagnosis present

## 2018-08-26 DIAGNOSIS — M3210 Systemic lupus erythematosus, organ or system involvement unspecified: Secondary | ICD-10-CM | POA: Insufficient documentation

## 2018-08-26 DIAGNOSIS — Z79899 Other long term (current) drug therapy: Secondary | ICD-10-CM | POA: Insufficient documentation

## 2018-08-26 DIAGNOSIS — M329 Systemic lupus erythematosus, unspecified: Secondary | ICD-10-CM | POA: Diagnosis present

## 2018-08-26 DIAGNOSIS — E860 Dehydration: Secondary | ICD-10-CM

## 2018-08-26 DIAGNOSIS — R0789 Other chest pain: Principal | ICD-10-CM | POA: Insufficient documentation

## 2018-08-26 DIAGNOSIS — D709 Neutropenia, unspecified: Secondary | ICD-10-CM | POA: Diagnosis present

## 2018-08-26 DIAGNOSIS — R079 Chest pain, unspecified: Secondary | ICD-10-CM | POA: Diagnosis present

## 2018-08-26 LAB — CBC WITH DIFFERENTIAL/PLATELET
Abs Immature Granulocytes: 0.01 10*3/uL (ref 0.00–0.07)
Basophils Absolute: 0 10*3/uL (ref 0.0–0.1)
Basophils Relative: 0 %
Eosinophils Absolute: 0 10*3/uL (ref 0.0–0.5)
Eosinophils Relative: 1 %
HCT: 36.8 % (ref 36.0–46.0)
Hemoglobin: 11.5 g/dL — ABNORMAL LOW (ref 12.0–15.0)
Immature Granulocytes: 0 %
Lymphocytes Relative: 36 %
Lymphs Abs: 1.3 10*3/uL (ref 0.7–4.0)
MCH: 28 pg (ref 26.0–34.0)
MCHC: 31.3 g/dL (ref 30.0–36.0)
MCV: 89.8 fL (ref 80.0–100.0)
Monocytes Absolute: 0.3 10*3/uL (ref 0.1–1.0)
Monocytes Relative: 8 %
Neutro Abs: 2 10*3/uL (ref 1.7–7.7)
Neutrophils Relative %: 55 %
Platelets: 263 10*3/uL (ref 150–400)
RBC: 4.1 MIL/uL (ref 3.87–5.11)
RDW: 12.5 % (ref 11.5–15.5)
WBC: 3.6 10*3/uL — ABNORMAL LOW (ref 4.0–10.5)
nRBC: 0 % (ref 0.0–0.2)

## 2018-08-26 LAB — COMPREHENSIVE METABOLIC PANEL
ALT: 21 U/L (ref 0–44)
AST: 15 U/L (ref 15–41)
Albumin: 4.6 g/dL (ref 3.5–5.0)
Alkaline Phosphatase: 55 U/L (ref 38–126)
Anion gap: 6 (ref 5–15)
BUN: 21 mg/dL — ABNORMAL HIGH (ref 6–20)
CO2: 25 mmol/L (ref 22–32)
Calcium: 9.2 mg/dL (ref 8.9–10.3)
Chloride: 105 mmol/L (ref 98–111)
Creatinine, Ser: 0.68 mg/dL (ref 0.44–1.00)
GFR calc Af Amer: >60 mL/min (ref >60)
GFR calc non Af Amer: >60 mL/min (ref >60)
Glucose, Bld: 95 mg/dL (ref 70–99)
Potassium: 3.8 mmol/L (ref 3.5–5.1)
Sodium: 136 mmol/L (ref 135–145)
Total Bilirubin: 0.5 mg/dL (ref 0.3–1.2)
Total Protein: 8 g/dL (ref 6.5–8.1)

## 2018-08-26 LAB — TROPONIN I: Troponin I: <0.03 ng/mL (ref <0.03)

## 2018-08-26 LAB — BRAIN NATRIURETIC PEPTIDE: B Natriuretic Peptide: 24 pg/mL (ref 0.0–100.0)

## 2018-08-26 MED ORDER — ONDANSETRON HCL 4 MG/2ML IJ SOLN
4.0000 mg | Freq: Once | INTRAMUSCULAR | Status: AC
  Administered 2018-08-27: 4 mg via INTRAVENOUS
  Filled 2018-08-26: qty 2

## 2018-08-26 MED ORDER — DEXAMETHASONE SODIUM PHOSPHATE 10 MG/ML IJ SOLN
10.0000 mg | Freq: Once | INTRAMUSCULAR | Status: AC
  Administered 2018-08-27: 10 mg via INTRAVENOUS
  Filled 2018-08-26: qty 1

## 2018-08-26 MED ORDER — SODIUM CHLORIDE 0.9 % IV BOLUS
1000.0000 mL | Freq: Once | INTRAVENOUS | Status: AC
  Administered 2018-08-27: 1000 mL via INTRAVENOUS

## 2018-08-26 MED ORDER — MORPHINE SULFATE (PF) 4 MG/ML IV SOLN
4.0000 mg | Freq: Once | INTRAVENOUS | Status: AC
  Administered 2018-08-27: 4 mg via INTRAVENOUS
  Filled 2018-08-26: qty 1

## 2018-08-26 MED ORDER — LIDOCAINE VISCOUS HCL 2 % MT SOLN
15.0000 mL | Freq: Once | OROMUCOSAL | Status: AC
  Administered 2018-08-27: 15 mL via OROMUCOSAL
  Filled 2018-08-26: qty 15

## 2018-08-26 NOTE — ED Provider Notes (Signed)
[facility] EMERGENCY DEPARTMENT Provider Note   CSN: [id number] Arrival date & time: 08/26/18  1937     History   Chief Complaint Chief Complaint  Patient presents with  . Chest Pain    Joint pain     HPI Wendy Herman is a 32 y.o. female with a longstanding history of lupus, fibromyalgia and chronic pain associated with her illness presenting with acute exacerbation of her lupus symptoms.  She has returned to this area from her home in [place] this past month due to family concerns and has decided she needs to stay here for at least 4 months to assist her grandparents.  In the interim, she is out of all of her lupus medicines, although she has not taken her methotrexate in over 3 months now.  She reports severe pain in her bilateral legs, especially her bilateral knees which are swollen.  She also endorses bilateral foot pain, generalized body pain and chest pain with intermittent episodes of shortness of breath.  She has been febrile the past several days, endorsing fever to 100.1.  She denies abdominal pain, vomiting or diarrhea, but has had nausea.  She reports insomnia due to pain, stating she has not slept in the past 4 days. She also reports poor PO intake.  The history is provided by the patient.  Chest Pain   Associated symptoms include a fever, headaches, nausea, palpitations and shortness of breath. Pertinent negatives include no abdominal pain, no dizziness, no numbness, no vomiting and no weakness.    Past Medical History:  Diagnosis Date  . Arthritis   . Chronic pain   . Fibromyalgia   . Lupus (HCC)   . Neutropenia (HCC)     There are no active problems to display for this patient.   Past Surgical History:  Procedure Laterality Date  . CESAREAN SECTION       OB History   None      Home Medications    Prior to Admission medications   Medication Sig Start Date End Date Taking? Authorizing Provider  Bacillus Coagulans-Inulin (PROBIOTIC FORMULA PO) Take  1-2 capsules by mouth daily.   Yes [provider]  buPROPion (WELLBUTRIN) 100 MG tablet Take 200 mg by mouth daily.   Yes [provider]  Calcium Carb-Cholecalciferol (CALCIUM 600 + D PO) Take 1 tablet by mouth daily.   Yes [provider]  cloNIDine (CATAPRES) 0.1 MG tablet Take 0.1-0.2 mg by mouth daily as needed (for multiple symptoms).   Yes [provider]  dapsone 100 MG tablet Take 100 mg by mouth daily.   Yes [provider]  diazepam (VALIUM) 10 MG tablet Take 10 mg by mouth every morning.   Yes [provider]  diphenhydrAMINE (BENADRYL) 25 mg capsule Take 200 mg by mouth at bedtime as needed for sleep.   Yes [provider]  folic acid (FOLVITE) 800 MCG tablet Take 800 mcg by mouth daily.   Yes [provider]  hydroxychloroquine (PLAQUENIL) 200 MG tablet Take 400 mg by mouth daily. 04/04/13  Yes Wayland Salinas, MD  leucovorin (WELLCOVORIN) 5 MG tablet Take 10 mg by mouth See admin instructions. To take on the same day of each week on the day after taking Methotrexate   Yes [provider]  methotrexate (RHEUMATREX) 2.5 MG tablet Take 25 mg by mouth once a week. Caution:Chemotherapy. Protect from light.   Yes [provider]  OLANZapine (ZYPREXA) 20 MG tablet Take 40 mg by  mouth at bedtime.   Yes [provider]  omeprazole (PRILOSEC) 20 MG capsule Take 40 mg by mouth 2 (two) times daily as needed (for acid reflux). Acid Reflux   Yes [provider]  oxyCODONE-acetaminophen (PERCOCET) 10-325 MG tablet Take 1 tablet by mouth 3 (three) times daily.   Yes [provider]  predniSONE (DELTASONE) 5 MG tablet Take 5-20 mg by mouth daily as needed (titration course as needed for flares).   Yes [provider]  Quinacrine HCl POWD Take 1 capsule by mouth daily.   Yes [provider]  zolpidem (AMBIEN) 5 MG tablet Take 5 mg by mouth at bedtime.   Yes [provider]    Family History No family history on file.  Social History Social History   Tobacco Use  . Smoking status: Former Games developer  . Smokeless tobacco: Never Used  Substance Use Topics  . Alcohol use: No  . Drug use: No     Allergies   Codeine; Nsaids; and Sulfa antibiotics   Review of Systems Review of Systems  Constitutional: Positive for fatigue and fever.  HENT: Negative.   Eyes: Negative.   Respiratory: Positive for shortness of breath. Negative for chest tightness.   Cardiovascular: Positive for chest pain and palpitations.  Gastrointestinal: Positive for nausea. Negative for abdominal pain, diarrhea and vomiting.  Genitourinary: Negative.   Musculoskeletal: Positive for arthralgias and joint swelling. Negative for neck pain.  Skin: Negative.  Negative for rash and wound.  Neurological: Positive for headaches. Negative for dizziness, weakness, light-headedness and numbness.  Psychiatric/Behavioral: Negative.      Physical Exam Updated Vital Signs BP (!) 135/101 (BP Location: Left Arm)   Pulse 96   Temp 98.6 F (37 C) (Axillary)   Resp (!) 22   Ht 5\' 4"  (1.626 m)   Wt 68 kg   LMP 08/13/2018   SpO2 100%   BMI 25.75 kg/m   Physical Exam  Constitutional: She appears well-developed and well-nourished.  Non-toxic appearance. She appears ill.  HENT:  Head: Atraumatic.  Mouth/Throat: Oral lesions present.  Small tongue and buccal mucosal lesions.  Eyes: Conjunctivae are normal.  Neck: Normal range of motion.  Cardiovascular: Regular rhythm, normal heart sounds and intact distal pulses. Tachycardia present.  Pulses:      Dorsalis pedis pulses are 2+ on the right side, and 2+ on the left side.  Pulmonary/Chest: Effort normal and breath sounds normal. No accessory muscle usage or stridor. No tachypnea. She has no decreased breath sounds. She has no wheezes. She has no rhonchi. She has no rales.  Abdominal: Soft. Bowel sounds are normal. There is no  tenderness.  Musculoskeletal: Normal range of motion.       Right lower leg: She exhibits tenderness.       Left lower leg: She exhibits tenderness.  ttp bilateral feet and knees.  Pulses intact.  No edema. Calves symmetric and nontender.   Neurological: She is alert.  Skin: Skin is warm and dry.  Facial and scalp scarring, hair loss present.  Psychiatric: She has a normal mood and affect.  Nursing note and vitals reviewed.    ED Treatments / Results  Labs (all labs ordered are listed, but only abnormal results are displayed) Labs Reviewed  CBC WITH DIFFERENTIAL/PLATELET - Abnormal; Notable for the following components:      Result Value   WBC 3.6 (*)    Hemoglobin 11.5 (*)    All other components within normal limits  COMPREHENSIVE METABOLIC PANEL - Abnormal; Notable for the following components:   BUN 21 (*)    All other components within normal limits  BRAIN NATRIURETIC PEPTIDE  TROPONIN I    EKG EKG Interpretation  Date/Time:  Sunday August 26 2018 19:51:00 EST Ventricular Rate:  117 PR Interval:  138 QRS Duration: 66 QT Interval:  336 QTC Calculation: 468 R Axis:   57 Text Interpretation:  Sinus tachycardia Possible Left atrial enlargement Borderline ECG Confirmed by Bethann BerkshireZammit, Joseph [phone]([id number]) on 08/26/2018 10:58:17 PM   Radiology Dg Chest 2 View  Result Date: 08/26/2018 CLINICAL DATA:  Chest pain and body aches.  Fever.  Lupus. EXAM: CHEST - 2 VIEW COMPARISON:  09/15/2012 FINDINGS: The heart size and mediastinal contours are within normal limits. Both lungs are clear. The visualized skeletal structures are unremarkable. IMPRESSION: Negative.  No active cardiopulmonary disease. Electronically Signed   By: Myles RosenthalJohn  Stahl M.D.   On: 08/26/2018 22:33    Procedures Procedures (including critical care time)  Medications Ordered in ED Medications  sodium chloride 0.9 % bolus 1,000 mL (1,000 mLs Intravenous New Bag/Given 08/27/18 0003)  dexamethasone (DECADRON)  injection 10 mg (10 mg Intravenous Given 08/27/18 0005)  morphine 4 MG/ML injection 4 mg (4 mg Intravenous Given 08/27/18 0007)  ondansetron (ZOFRAN) injection 4 mg (4 mg Intravenous Given 08/27/18 0002)  lidocaine (XYLOCAINE) 2 % viscous mouth solution 15 mL (15 mLs Mouth/Throat Given 08/27/18 0002)     Initial Impression / Assessment and Plan / ED Course  I have reviewed the triage vital signs and the nursing notes.  Pertinent labs & imaging results that were available during my care of the patient were reviewed by me and considered in my medical decision making (see chart for details).     Pt with acute flare of her lupus including severe arthralgias, low grade fever and chest pain. Clinically she is dehydrated.  No current access to [place] lupus provider [place] in [place].  Pt will benefit from rehydration and to rule out acute chest pain, medication assistance and pain control.  Final Clinical Impressions(s) / ED Diagnoses   Final diagnoses:  Lupus (HCC)  Chest pain, unspecified type  Dehydration    ED Discharge Orders    None       Victoriano Laindol, Ashlynne Shetterly, PA-C 08/27/18 1332    Bethann BerkshireZammit, Joseph, MD 08/29/18 [phone]

## 2018-08-26 NOTE — ED Triage Notes (Addendum)
Pt states she has Lupus and has not been on her meds for 3 months. Pt here for "flare-up" of symptoms. C/o generalized body aches and chest pain. Pt very sensitive to touch from her Lupus. Pt is from out of town [facility]([place]) and is not planning to go back. States she has had a low grade fever the last couple of days as well. Pt describes having stabbing pain in both feet and painful joints.

## 2018-08-26 NOTE — ED Notes (Signed)
When asked pt how she has been getting medications such as Plaquenil- PT told this nurse that she has been ordering medications, other than narcotics online.

## 2018-08-27 ENCOUNTER — Encounter (HOSPITAL_COMMUNITY): Payer: Self-pay | Admitting: Internal Medicine

## 2018-08-27 ENCOUNTER — Other Ambulatory Visit: Payer: Self-pay

## 2018-08-27 DIAGNOSIS — D649 Anemia, unspecified: Secondary | ICD-10-CM | POA: Diagnosis present

## 2018-08-27 DIAGNOSIS — M329 Systemic lupus erythematosus, unspecified: Secondary | ICD-10-CM | POA: Diagnosis present

## 2018-08-27 DIAGNOSIS — R079 Chest pain, unspecified: Secondary | ICD-10-CM | POA: Diagnosis present

## 2018-08-27 DIAGNOSIS — D709 Neutropenia, unspecified: Secondary | ICD-10-CM | POA: Diagnosis present

## 2018-08-27 LAB — URINALYSIS, ROUTINE W REFLEX MICROSCOPIC
Bilirubin Urine: NEGATIVE
Glucose, UA: NEGATIVE mg/dL
Hgb urine dipstick: NEGATIVE
Ketones, ur: NEGATIVE mg/dL
Leukocytes, UA: NEGATIVE
Nitrite: NEGATIVE
Protein, ur: NEGATIVE mg/dL
Specific Gravity, Urine: 1.026 (ref 1.005–1.030)
pH: 5 (ref 5.0–8.0)

## 2018-08-27 LAB — TROPONIN I
Troponin I: <0.03 ng/mL (ref <0.03)
Troponin I: <0.03 ng/mL (ref <0.03)

## 2018-08-27 LAB — SEDIMENTATION RATE: Sed Rate: 15 mm/hr (ref 0–22)

## 2018-08-27 MED ORDER — HEPARIN SODIUM (PORCINE) 5000 UNIT/ML IJ SOLN: 5000.0000 [IU] | Freq: Three times a day (TID) | INTRAMUSCULAR | Status: DC

## 2018-08-27 MED ORDER — DAPSONE 100 MG PO TABS: 100.0000 mg | ORAL_TABLET | Freq: Every day | ORAL | 0 refills | Status: AC

## 2018-08-27 MED ORDER — HYDROXYCHLOROQUINE SULFATE 200 MG PO TABS: 400.0000 mg | ORAL_TABLET | Freq: Every day | ORAL | 0 refills | Status: AC

## 2018-08-27 MED ORDER — ENSURE ENLIVE PO LIQD
237.0000 mL | Freq: Two times a day (BID) | ORAL | Status: DC
  Administered 2018-08-27: 237 mL via ORAL

## 2018-08-27 MED ORDER — OXYCODONE-ACETAMINOPHEN 10-325 MG PO TABS: 1.0000 | ORAL_TABLET | Freq: Three times a day (TID) | ORAL | 0 refills | Status: AC

## 2018-08-27 MED ORDER — ONDANSETRON HCL 4 MG/2ML IJ SOLN: 4.0000 mg | Freq: Four times a day (QID) | INTRAMUSCULAR | Status: DC | PRN

## 2018-08-27 MED ORDER — HEPARIN SODIUM (PORCINE) 5000 UNIT/ML IJ SOLN
5000.0000 [IU] | Freq: Three times a day (TID) | INTRAMUSCULAR | Status: DC
  Administered 2018-08-27: 5000 [IU] via SUBCUTANEOUS
  Filled 2018-08-27 (×2): qty 1

## 2018-08-27 MED ORDER — KETOROLAC TROMETHAMINE 30 MG/ML IJ SOLN
30.0000 mg | Freq: Once | INTRAMUSCULAR | Status: AC
  Administered 2018-08-27: 30 mg via INTRAVENOUS
  Filled 2018-08-27: qty 1

## 2018-08-27 MED ORDER — BUPROPION HCL 100 MG PO TABS: 200.0000 mg | ORAL_TABLET | Freq: Every day | ORAL | 0 refills | Status: DC

## 2018-08-27 MED ORDER — ACETAMINOPHEN 325 MG PO TABS: 650.0000 mg | ORAL_TABLET | ORAL | Status: DC | PRN

## 2018-08-27 MED ORDER — ONDANSETRON HCL 4 MG PO TABS: 4.0000 mg | ORAL_TABLET | Freq: Four times a day (QID) | ORAL | Status: DC | PRN

## 2018-08-27 MED ORDER — OLANZAPINE 20 MG PO TABS: 40.0000 mg | ORAL_TABLET | Freq: Every day | ORAL | 0 refills | Status: DC

## 2018-08-27 MED ORDER — METHOTREXATE 2.5 MG PO TABS: 25.0000 mg | ORAL_TABLET | ORAL | 0 refills | Status: DC

## 2018-08-27 MED ORDER — ACETAMINOPHEN 650 MG RE SUPP: 650.0000 mg | Freq: Four times a day (QID) | RECTAL | Status: DC | PRN

## 2018-08-27 MED ORDER — FOLIC ACID 800 MCG PO TABS: 800.0000 ug | ORAL_TABLET | Freq: Every day | ORAL | 0 refills | Status: AC

## 2018-08-27 MED ORDER — ACETAMINOPHEN 325 MG PO TABS: 650.0000 mg | ORAL_TABLET | Freq: Four times a day (QID) | ORAL | Status: DC | PRN

## 2018-08-27 MED ORDER — PANTOPRAZOLE SODIUM 40 MG PO TBEC
40.0000 mg | DELAYED_RELEASE_TABLET | Freq: Every day | ORAL | Status: DC
  Administered 2018-08-27: 40 mg via ORAL
  Filled 2018-08-27: qty 1

## 2018-08-27 MED ORDER — LEUCOVORIN CALCIUM 5 MG PO TABS: 10.0000 mg | ORAL_TABLET | ORAL | 0 refills | Status: DC

## 2018-08-27 NOTE — Progress Notes (Signed)
Wendy SwazilandJordan RN and I went into review discharge instructions with patient. Patient stated that she needed more methotrexate as the Dr. Only gave her 4 and she needed 10 for her correct dose. Requested that we get a prescription for 10 methotrexate instead of 4.  IV dc'd from right AC.

## 2018-08-27 NOTE — ACP (Advance Care Planning) (Signed)
Patient was sleeping. I left the Advance Directives information with contact information in her room. Will follow up tomorrow.

## 2018-08-27 NOTE — Care Management (Signed)
CM consulted for medications needs. Patient has insurance. Has been provided list of providers for new PCP. Given prescriptions and advised to follow up with rheumatologist.

## 2018-08-27 NOTE — ED Notes (Signed)
ED TO INPATIENT HANDOFF REPORT  Name/Age/Gender Wendy Herman 32 y.o. female  Code Status    Code Status Orders  (From admission, onward)         Start     Ordered   08/27/18 0038  Full code  Continuous     08/27/18 0038        Code Status History    Date Active Date Inactive Code Status Order ID Comments User Context   08/27/2018 0038 08/27/2018 0038 Full Code [id number]  Reubin Milan, MD ED      Home/SNF/Other Home  Chief Complaint sle severe chest pain   Level of Care/Admitting Diagnosis ED Disposition    ED Disposition Condition [place]: [facility] [[id number]]  Level of Care: Telemetry [5]  Diagnosis: Chest pain [016553]  Admitting Physician: Reubin Milan [[id number]]  Attending Physician: Reubin Milan [[id number]]  PT Class (Do Not Modify): Observation [104]  PT Acc Code (Do Not Modify): Observation [10022]       Medical History Past Medical History:  Diagnosis Date  . Arthritis   . Chronic pain   . Fibromyalgia   . Lupus ([place])   . Neutropenia (HCC)     Allergies Allergies  Allergen Reactions  . Codeine Swelling  . Nsaids   . Sulfa Antibiotics Swelling    IV Location/Drains/Wounds Patient Lines/Drains/Airways Status   Active Line/Drains/Airways    Name:   Placement date:   Placement time:   Site:   Days:   Peripheral IV 08/27/18 Right Antecubital   08/27/18    0001    Antecubital   less than 1          Labs/Imaging Results for orders placed or performed during the hospital encounter of 08/26/18 (from the past 48 hour(s))  CBC with Differential     Status: Abnormal   Collection Time: 08/26/18  9:51 PM  Result Value Ref Range   WBC 3.6 (L) 4.0 - 10.5 K/uL   RBC 4.10 3.87 - 5.11 MIL/uL   Hemoglobin 11.5 (L) 12.0 - 15.0 g/dL   HCT 36.8 36.0 - 46.0 %   MCV 89.8 80.0 - 100.0 fL   MCH 28.0 26.0 - 34.0 pg   MCHC 31.3 30.0 - 36.0 g/dL   RDW 12.5 11.5 - 15.5 %   Platelets 263 150 - 400 K/uL    nRBC 0.0 0.0 - 0.2 %   Neutrophils Relative % 55 %   Neutro Abs 2.0 1.7 - 7.7 K/uL   Lymphocytes Relative 36 %   Lymphs Abs 1.3 0.7 - 4.0 K/uL   Monocytes Relative 8 %   Monocytes Absolute 0.3 0.1 - 1.0 K/uL   Eosinophils Relative 1 %   Eosinophils Absolute 0.0 0.0 - 0.5 K/uL   Basophils Relative 0 %   Basophils Absolute 0.0 0.0 - 0.1 K/uL   Immature Granulocytes 0 %   Abs Immature Granulocytes 0.01 0.00 - 0.07 K/uL    Comment: Performed at [facility], [address] [place], [place] [address]  Comprehensive metabolic panel     Status: Abnormal   Collection Time: 08/26/18  9:51 PM  Result Value Ref Range   Sodium 136 135 - 145 mmol/L   Potassium 3.8 3.5 - 5.1 mmol/L   Chloride 105 98 - 111 mmol/L   CO2 25 22 - 32 mmol/L   Glucose, Bld 95 70 - 99 mg/dL   BUN 21 (H) 6 - 20 mg/dL  Creatinine, Ser 0.68 0.44 - 1.00 mg/dL   Calcium 9.2 8.9 - 10.3 mg/dL   Total Protein 8.0 6.5 - 8.1 g/dL   Albumin 4.6 3.5 - 5.0 g/dL   AST 15 15 - 41 U/L   ALT 21 0 - 44 U/L   Alkaline Phosphatase 55 38 - 126 U/L   Total Bilirubin 0.5 0.3 - 1.2 mg/dL   GFR calc non Af Amer >60 >60 mL/min   GFR calc Af Amer >60 >60 mL/min    Comment: (NOTE) The eGFR has been calculated using the CKD EPI equation. This calculation has not been validated in all clinical situations. eGFR's persistently <60 mL/min signify possible Chronic Kidney Disease.    Anion gap 6 5 - 15    Comment: Performed at [facility], [address] [place], [place] [address]  Troponin I - Once     Status: None   Collection Time: 08/26/18  9:51 PM  Result Value Ref Range   Troponin I <0.03 <0.03 ng/mL    Comment: Performed at [facility], [address] [place], [place] [address]  Brain natriuretic peptide     Status: None   Collection Time: 08/26/18 10:04 PM  Result Value Ref Range   B Natriuretic Peptide 24.0 0.0 - 100.0 pg/mL    Comment: Performed at [facility], [address] [place], [place] [address]   Dg Chest 2  View  Result Date: 08/26/2018 CLINICAL DATA:  Chest pain and body aches.  Fever.  Lupus. EXAM: CHEST - 2 VIEW COMPARISON:  09/15/2012 FINDINGS: The heart size and mediastinal contours are within normal limits. Both lungs are clear. The visualized skeletal structures are unremarkable. IMPRESSION: Negative.  No active cardiopulmonary disease. Electronically Signed   By: Earle Gell M.D.   On: 08/26/2018 22:33    Pending Labs Unresulted Labs (From admission, onward)    Start     Ordered   08/27/18 0400  Troponin I - Now Then Q6H  Now then every 6 hours,   R     08/27/18 0030   08/27/18 0400  ANA  Once,   R     08/27/18 0032   08/27/18 0400  HIV antibody (Routine Testing)  Once,   R     08/27/18 0038   08/27/18 0030  Sedimentation rate  Add-on,   R     08/27/18 0030   08/27/18 0029  Urinalysis, Routine w reflex microscopic  Once,   R     08/27/18 0028   08/27/18 0018  Urinalysis, Routine w reflex microscopic  ONCE - STAT,   R     08/27/18 0018          Vitals/Pain Today's Vitals   08/27/18 0000 08/27/18 0010 08/27/18 0054 08/27/18 0130  BP:  (!) 143/99  133/89  Pulse:  92  100  Resp:  18  16  Temp:      TempSrc:      SpO2:  100%  100%  Weight:      Height:      PainSc: 10-Worst pain ever  (S) 6      Isolation Precautions No active isolations  Medications Medications  ondansetron (ZOFRAN) injection 4 mg (has no administration in time range)  heparin injection 5,000 Units (has no administration in time range)  acetaminophen (TYLENOL) tablet 650 mg (has no administration in time range)    Or  acetaminophen (TYLENOL) suppository 650 mg (has no administration in time range)  sodium chloride 0.9 %  bolus 1,000 mL (0 mLs Intravenous Stopped 08/27/18 0104)  dexamethasone (DECADRON) injection 10 mg (10 mg Intravenous Given 08/27/18 0005)  morphine 4 MG/ML injection 4 mg (4 mg Intravenous Given 08/27/18 0007)  ondansetron (ZOFRAN) injection 4 mg (4 mg Intravenous Given 08/27/18  0002)  lidocaine (XYLOCAINE) 2 % viscous mouth solution 15 mL (15 mLs Mouth/Throat Given 08/27/18 0002)    Mobility walks

## 2018-08-27 NOTE — Discharge Summary (Signed)
Physician Discharge Summary  Wendy Herman LTJ:[id number] DOB: 10-Feb-1986 DOA: 08/26/2018  PCP: Glenda Chroman, MD  Admit date: 08/26/2018  Discharge date: 08/27/2018  Admitted From:Home  Disposition:  Home  Recommendations for Outpatient Follow-up:  1. Follow up with new PCP in 1-2 weeks; list provided on DC 2. Medication refills provided as patient has been off SLE medications for the last 2 to 3 months.  This has been given as a printout to last up to 1 month and will need follow-up by rheumatology  Home Health: None  Equipment/Devices: None  Discharge Condition: Stable  CODE STATUS: Full  Diet recommendation: Heart Healthy  Brief/Interim Summary: Per HPI from Dr. Olevia Bowens: Wendy Herman is a 32 y.o. female with medical history significant of arthritis, chronic pain, fibromyalgia, lupus, neutropenia who is coming to the emergency department with complaints of chest pain for the past 2 to 3 days associated with multiple joints with arthralgias multiple sides of myalgias.  The chest pain is described as constant and dull.  Although sometimes he can get momentarily worse.  Associated symptoms are dyspnea and palpitations, but denying dizziness, diaphoresis, PND, orthopnea or recent pitting edema of the lower extremities.  She has not taken her anti-lupus medications in several months.  She complains of a low-grade temperature, but denies chills or diaphoresis.  However, she says she feels tired.  Denies rhinorrhea, sore throat, productive cough, abdominal pain, diarrhea, constipation, melena or hematochezia.  No dysuria, frequency or hematuria.  No heat or cold intolerance.  No polyuria, polydipsia, polyphagia or blurred vision.  Patient was admitted for atypical chest pain with negative troponin levels or EKG findings.  No sign of ACS noted.  She was noted to have a mild SLE flare and received some dexamethasone as well as Toradol in the ED.  ESR and ANA levels are within normal limits.   She will require her home medications to include methotrexate, Plaquenil, and leucovorin which have been provided on discharge as per now prescriptions.  She is recently moved from [place] and will need a new primary care provider and rheumatologist to follow-up for her medical needs.  A list of doctors has been provided to help with follow-up.  No further acute events noted during this brief admission.  Discharge Diagnoses:  Principal Problem:   Chest pain Active Problems:   Neutropenia (HCC)   Anemia   SLE (systemic lupus erythematosus) (HCC)  Principal discharge diagnosis: Atypical chest pain related to SLE flare.  Discharge Instructions  Discharge Instructions    Diet - low sodium heart healthy   Complete by:  As directed    Increase activity slowly   Complete by:  As directed      Allergies as of 08/27/2018      Reactions   Codeine Swelling   Nsaids    Sulfa Antibiotics Swelling      Medication List    STOP taking these medications   buPROPion 100 MG tablet Commonly known as:  Wendy Herman   cloNIDine 0.1 MG tablet Commonly known as:  CATAPRES   diazepam 10 MG tablet Commonly known as:  VALIUM   OLANZapine 20 MG tablet Commonly known as:  ZYPREXA   predniSONE 5 MG tablet Commonly known as:  DELTASONE   Quinacrine HCl Powd     TAKE these medications   CALCIUM 600 + D PO Take 1 tablet by mouth daily.   dapsone 100 MG tablet Take 1 tablet (100 mg total) by mouth daily.  diphenhydrAMINE 25 mg capsule Commonly known as:  BENADRYL Take 200 mg by mouth at bedtime as needed for sleep.   folic acid 675 MCG tablet Commonly known as:  FOLVITE Take 1 tablet (800 mcg total) by mouth daily.   hydroxychloroquine 200 MG tablet Commonly known as:  PLAQUENIL Take 2 tablets (400 mg total) by mouth daily.   leucovorin 5 MG tablet Commonly known as:  WELLCOVORIN Take 2 tablets (10 mg total) by mouth See admin instructions. To take on the same day of each week  on the day after taking Methotrexate   methotrexate 2.5 MG tablet Commonly known as:  RHEUMATREX Take 10 tablets (25 mg total) by mouth once a week. Caution:Chemotherapy. Protect from light.   omeprazole 20 MG capsule Commonly known as:  PRILOSEC Take 40 mg by mouth 2 (two) times daily as needed (for acid reflux). Acid Reflux   oxyCODONE-acetaminophen 10-325 MG tablet Commonly known as:  PERCOCET Take 1 tablet by mouth 3 (three) times daily for 5 days.   PROBIOTIC FORMULA PO Take 1-2 capsules by mouth daily.      Follow-up Information    Vyas, Dhruv B, MD Follow up in 1 week(s).   Specialty:  Internal Medicine Contact information: [place] [address] [phone]          Allergies  Allergen Reactions  . Codeine Swelling  . Nsaids   . Sulfa Antibiotics Swelling    Consultations:  None   Procedures/Studies: Dg Chest 2 View  Result Date: 08/26/2018 CLINICAL DATA:  Chest pain and body aches.  Fever.  Lupus. EXAM: CHEST - 2 VIEW COMPARISON:  09/15/2012 FINDINGS: The heart size and mediastinal contours are within normal limits. Both lungs are clear. The visualized skeletal structures are unremarkable. IMPRESSION: Negative.  No active cardiopulmonary disease. Electronically Signed   By: Earle Gell M.D.   On: 08/26/2018 22:33     Discharge Exam: Vitals:   08/27/18 0225 08/27/18 0643  BP: (!) 161/128 (!) 145/95  Pulse: (!) 101 96  Resp:  18  Temp: 98.6 F (37 C) 98.4 F (36.9 C)  SpO2: 100% 100%   Vitals:   08/27/18 0037 08/27/18 0130 08/27/18 0225 08/27/18 0643  BP:  133/89 (!) 161/128 (!) 145/95  Pulse:  100 (!) 101 96  Resp:  16  18  Temp:   98.6 F (37 C) 98.4 F (36.9 C)  TempSrc:   Oral Oral  SpO2: 99% 100% 100% 100%  Weight:   68 kg   Height:   _0  (1.626 m)     General: Pt is alert, awake, not in acute distress Cardiovascular: RRR, S1/S2 +, no rubs, no gallops Respiratory: CTA bilaterally, no wheezing, no rhonchi Abdominal:  Soft, NT, ND, bowel sounds + Extremities: no edema, no cyanosis Mild chest pain/tenderness to palpation which is reproducible.    The results of significant diagnostics from this hospitalization (including imaging, microbiology, ancillary and laboratory) are listed below for reference.     Microbiology: No results found for this or any previous visit (from the past 240 hour(s)).   Labs: BNP (last 3 results) Recent Labs    08/26/18 2204  BNP 46.6   Basic Metabolic Panel: Recent Labs  Lab 08/26/18 2151  NA 136  K 3.8  CL 105  CO2 25  GLUCOSE 95  BUN 21*  CREATININE 0.68  CALCIUM 9.2   Liver Function Tests: Recent Labs  Lab 08/26/18 2151  AST 15  ALT 21  ALKPHOS 55  BILITOT 0.5  PROT 8.0  ALBUMIN 4.6   No results for input(s): LIPASE, AMYLASE in the last 168 hours. No results for input(s): AMMONIA in the last 168 hours. CBC: Recent Labs  Lab 08/26/18 2151  WBC 3.6*  NEUTROABS 2.0  HGB 11.5*  HCT 36.8  MCV 89.8  PLT 263   Cardiac Enzymes: Recent Labs  Lab 08/26/18 2151 08/27/18 0352  TROPONINI <0.03 <0.03   BNP: Invalid input(s): POCBNP CBG: No results for input(s): GLUCAP in the last 168 hours. D-Dimer No results for input(s): DDIMER in the last 72 hours. Hgb A1c No results for input(s): HGBA1C in the last 72 hours. Lipid Profile No results for input(s): CHOL, HDL, LDLCALC, TRIG, CHOLHDL, LDLDIRECT in the last 72 hours. Thyroid function studies No results for input(s): TSH, T4TOTAL, T3FREE, THYROIDAB in the last 72 hours.  Invalid input(s): FREET3 Anemia work up No results for input(s): VITAMINB12, FOLATE, FERRITIN, TIBC, IRON, RETICCTPCT in the last 72 hours. Urinalysis    Component Value Date/Time   COLORURINE YELLOW 08/27/2018 0018   APPEARANCEUR HAZY (A) 08/27/2018 0018   LABSPEC 1.026 08/27/2018 0018   PHURINE 5.0 08/27/2018 0018   GLUCOSEU NEGATIVE 08/27/2018 0018   HGBUR NEGATIVE 08/27/2018 0018   BILIRUBINUR NEGATIVE  08/27/2018 0018   KETONESUR NEGATIVE 08/27/2018 0018   PROTEINUR NEGATIVE 08/27/2018 0018   UROBILINOGEN 1.0 07/27/2014 1715   NITRITE NEGATIVE 08/27/2018 0018   LEUKOCYTESUR NEGATIVE 08/27/2018 0018   Sepsis Labs Invalid input(s): PROCALCITONIN,  WBC,  LACTICIDVEN Microbiology No results found for this or any previous visit (from the past 240 hour(s)).   Time coordinating discharge: 35 minutes  SIGNED:   Rodena Goldmann, DO [facility] 08/27/2018, 9:13 AM Pager [phone]  If 7PM-7AM, please contact night-coverage [email or web address] Password [email or web address]

## 2018-08-27 NOTE — H&P (Signed)
History and Physical    CEAIRA ERNSTER KAJ:[id number] DOB: 1986-03-17 DOA: 08/26/2018  PCP: Glenda Chroman, MD   Patient coming from: Home.   Chief Complaint: Chest pain/ joint pain  HPI: Wendy Herman is a 32 y.o. female with medical history significant of arthritis, chronic pain, fibromyalgia, lupus, neutropenia who is coming to the emergency department with complaints of chest pain for the past 2 to 3 days associated with multiple joints with arthralgias multiple sides of myalgias.  The chest pain is described as constant and dull.  Although sometimes he can get momentarily worse.  Associated symptoms are dyspnea and palpitations, but denying dizziness, diaphoresis, PND, orthopnea or recent pitting edema of the lower extremities.  She has not taken her anti-lupus medications in several months.  She complains of a low-grade temperature, but denies chills or diaphoresis.  However, she says she feels tired.  Denies rhinorrhea, sore throat, productive cough, abdominal pain, diarrhea, constipation, melena or hematochezia.  No dysuria, frequency or hematuria.  No heat or cold intolerance.  No polyuria, polydipsia, polyphagia or blurred vision.  ED Course: BP (!) 135/101 (BP Location: Left Arm)   Pulse 96   Temp 98.6 F (37 C) (Axillary)   Resp (!) 22   Ht 5' 4" (1.626 m)   Wt 68 kg   LMP 08/13/2018   SpO2 100%   BMI 25.75 kg/m  .  The patient was given dexamethasone 10 mg IVP in the emergency department along with some IV fluids.  Her lab work shows a CBC with a white count 3.6 with a normal differential and hemoglobin of 11.5 g/dL.  Her platelets were normal at 263.  CMP shows a BUN of 21 mg/dL, but all other values are within normal limits.  Troponin and BMP were within normal limits.  Her chest radiograph did not show any active cardiopulmonary pathology.  Review of Systems: As per HPI otherwise 10 point review of systems negative.   Past Medical History:  Diagnosis Date  .  Arthritis   . Chronic pain   . Fibromyalgia   . Lupus ([place])   . Neutropenia [facility])     Past Surgical History:  Procedure Laterality Date  . CESAREAN SECTION       reports that she has quit smoking. She has never used smokeless tobacco. She reports that she does not drink alcohol or use drugs.  Allergies  Allergen Reactions  . Codeine Swelling  . Nsaids   . Sulfa Antibiotics Swelling    Family History  Problem Relation Age of Onset  . Hypertension Mother   . Heart attack Father        In his 33s. Died from it.  . Rheum arthritis Maternal Grandmother   . Lupus Maternal Grandmother   . Diabetes Maternal Grandmother   . Heart disease Maternal Grandfather   . Diabetes Maternal Grandfather   . Breast cancer Maternal Aunt   . Pancreatic cancer Maternal Uncle   . Diabetes Maternal Aunt   . Diabetes Maternal Aunt    Prior to Admission medications   Medication Sig Start Date End Date Taking? Authorizing Provider  Bacillus Coagulans-Inulin (PROBIOTIC FORMULA PO) Take 1-2 capsules by mouth daily.   Yes [provider]  buPROPion (WELLBUTRIN) 100 MG tablet Take 200 mg by mouth daily.   Yes [provider]  Calcium Carb-Cholecalciferol (CALCIUM 600 + D PO) Take 1 tablet by mouth daily.   Yes [provider]  cloNIDine (CATAPRES) 0.1 MG tablet  Take 0.1-0.2 mg by mouth daily as needed (for multiple symptoms).   Yes [provider]  dapsone 100 MG tablet Take 100 mg by mouth daily.   Yes [provider]  diazepam (VALIUM) 10 MG tablet Take 10 mg by mouth every morning.   Yes [provider]  diphenhydrAMINE (BENADRYL) 25 mg capsule Take 200 mg by mouth at bedtime as needed for sleep.   Yes [provider]  folic acid (FOLVITE) 882 MCG tablet Take 800 mcg by mouth daily.   Yes [provider]  hydroxychloroquine (PLAQUENIL) 200 MG tablet Take 400 mg by mouth daily. 04/04/13  Yes Riki Altes, MD  leucovorin  (WELLCOVORIN) 5 MG tablet Take 10 mg by mouth See admin instructions. To take on the same day of each week on the day after taking Methotrexate   Yes [provider]  methotrexate (RHEUMATREX) 2.5 MG tablet Take 25 mg by mouth once a week. Caution:Chemotherapy. Protect from light.   Yes [provider]  OLANZapine (ZYPREXA) 20 MG tablet Take 40 mg by mouth at bedtime.   Yes [provider]  omeprazole (PRILOSEC) 20 MG capsule Take 40 mg by mouth 2 (two) times daily as needed (for acid reflux). Acid Reflux   Yes [provider]  oxyCODONE-acetaminophen (PERCOCET) 10-325 MG tablet Take 1 tablet by mouth 3 (three) times daily.   Yes [provider]  predniSONE (DELTASONE) 5 MG tablet Take 5-20 mg by mouth daily as needed (titration course as needed for flares).   Yes [provider]  Quinacrine HCl POWD Take 1 capsule by mouth daily.   Yes [provider]    Physical Exam: Vitals:   08/26/18 2342 08/27/18 0010 08/27/18 0130 08/27/18 0225  BP: (!) 135/101 (!) 143/99 133/89 (!) 161/128  Pulse: 96 92 100 (!) 101  Resp: (!) _0 Temp: 98.6 F (37 C)   98.6 F (37 C)  TempSrc: Axillary   Oral  SpO2: 100% 100% 100% 100%  Weight:    68 kg  Height:    5' 4" (1.626 m)    Constitutional: NAD, calm, comfortable Eyes: PERRL, lids and conjunctivae normal ENMT: Mucous membranes are moist.  Lips are dry. There are some small areas of ulceration on lips and oral mucosa.  posterior pharynx clear of any exudate or lesions. Neck: normal, supple, no masses, no thyromegaly Respiratory: clear to auscultation bilaterally, no wheezing, no crackles. Normal respiratory effort. No accessory muscle use.  Cardiovascular: Regular rate and rhythm, no murmurs / rubs / gallops. No extremity edema. 2+ pedal pulses. No carotid bruits.  Abdomen: Soft, no tenderness, no masses palpated. No hepatosplenomegaly. Bowel sounds positive.  Musculoskeletal: no  clubbing / cyanosis. Good ROM, no contractures. Normal muscle tone.  Skin: Irregular alopecia, mostly on her left parieto-occipital area with erythema and mild tenderness to palpation on area.  There are previous skin changes on her face, particularly in her nasal area.   Neurologic: CN 2-12 grossly intact. Sensation intact, DTR normal. Strength 5/5 in all 4.  Psychiatric: Normal judgment and insight. Alert and oriented x 3. Normal mood.   Labs on Admission: I have personally reviewed following labs and imaging studies  CBC: Recent Labs  Lab 08/26/18 2151  WBC 3.6*  NEUTROABS 2.0  HGB 11.5*  HCT 36.8  MCV 89.8  PLT 800   Basic Metabolic Panel: Recent Labs  Lab 08/26/18 2151  NA 136  K 3.8  CL 105  CO2  25  GLUCOSE 95  BUN 21*  CREATININE 0.68  CALCIUM 9.2   GFR: Estimated Creatinine Clearance: 96.5 mL/min (by C-G formula based on SCr of 0.68 mg/dL). Liver Function Tests: Recent Labs  Lab 08/26/18 2151  AST 15  ALT 21  ALKPHOS 55  BILITOT 0.5  PROT 8.0  ALBUMIN 4.6   No results for input(s): LIPASE, AMYLASE in the last 168 hours. No results for input(s): AMMONIA in the last 168 hours. Coagulation Profile: No results for input(s): INR, PROTIME in the last 168 hours. Cardiac Enzymes: Recent Labs  Lab 08/26/18 2151  TROPONINI <0.03   BNP (last 3 results) No results for input(s): PROBNP in the last 8760 hours. HbA1C: No results for input(s): HGBA1C in the last 72 hours. CBG: No results for input(s): GLUCAP in the last 168 hours. Lipid Profile: No results for input(s): CHOL, HDL, LDLCALC, TRIG, CHOLHDL, LDLDIRECT in the last 72 hours. Thyroid Function Tests: No results for input(s): TSH, T4TOTAL, FREET4, T3FREE, THYROIDAB in the last 72 hours. Anemia Panel: No results for input(s): VITAMINB12, FOLATE, FERRITIN, TIBC, IRON, RETICCTPCT in the last 72 hours. Urine analysis:    Component Value Date/Time   COLORURINE YELLOW 07/27/2014 [place] 07/27/2014 1715   LABSPEC 1.020 07/27/2014 1715   PHURINE 7.0 07/27/2014 1715   GLUCOSEU NEGATIVE 07/27/2014 1715   HGBUR NEGATIVE 07/27/2014 [place] 07/27/2014 1715   KETONESUR NEGATIVE 07/27/2014 1715   PROTEINUR NEGATIVE 07/27/2014 1715   UROBILINOGEN 1.0 07/27/2014 1715   NITRITE NEGATIVE 07/27/2014 [place] 07/27/2014 1715    Radiological Exams on Admission: Dg Chest 2 View  Result Date: 08/26/2018 CLINICAL DATA:  Chest pain and body aches.  Fever.  Lupus. EXAM: CHEST - 2 VIEW COMPARISON:  09/15/2012 FINDINGS: The heart size and mediastinal contours are within normal limits. Both lungs are clear. The visualized skeletal structures are unremarkable. IMPRESSION: Negative.  No active cardiopulmonary disease. Electronically Signed   By: Earle Gell M.D.   On: 08/26/2018 22:33    EKG: Independently reviewed.  EKG #1 Vent. rate 117 BPM PR interval 138 ms QRS duration 66 ms QT/QTc 336/468 ms P-R-T axes 62 57 53 Sinus tachycardia Possible Left atrial enlargement Borderline ECG  EKG #2 Vent. rate 98 BPM PR interval * ms QRS duration 82 ms QT/QTc 357/456 ms P-R-T axes 55 48 43 Sinus rhythm.  Assessment/Plan Principal Problem:   Chest pain  Pain seems to be atypical. Observation/telemetry. Continue supplemental oxygen. Trend troponin levels. EKG as needed for chest pain.  Active Problems:   SLE (systemic lupus erythematosus) (HCC) She received dexamethasone 10 mg IVP in the emergency department. Toradol 30 mg IVP x1 dose only after being evaluated.  Avoid oral NSAIDs. Obtain old medical records from the patient's previous rheumatologist/PCP. Check ESR and ANA levels.  Reevaluate need for IV steroids after ESR ANA are back.    Neutropenia ([place])   Anemia Likely secondary to SLE. Monitor CBC.     DVT prophylaxis: Heparin SQ. Code Status: Full code. Family Communication: Disposition Plan: Observation for chest pain and  SLE related arthralgias/myalgias treatment. Consults called: Admission status: Observation/telemetry.   Reubin Milan MD [facility] Pager [phone].  If 7PM-7AM, please contact night-coverage [email or web address] Password [facility]  08/27/2018, 4:38 AM

## 2018-08-27 NOTE — Progress Notes (Signed)
Pt's IV catheter removed and intact. Pt's IV site clean dry and intact. Discharge instructions reviewed and discussed with patient. All questions were answered and no further questions at this time. Pt in stable condition and in no acute distress at time of discharge. Pt escorted by nurse tech.

## 2018-08-27 NOTE — Progress Notes (Signed)
Katie from [facility] called and states [facility] but only wants oxycodone filled.  Pharmacy wanted to know if the patient needed other prescriptions filled. Pharmacist advised that the physician prescribed all of her medications so her condition can be treated and oxycodone alone will not treat her illness only her symptoms of pain.

## 2018-08-28 LAB — ANA: Anti Nuclear Antibody(ANA): POSITIVE — AB

## 2018-08-28 LAB — HIV ANTIBODY (ROUTINE TESTING W REFLEX): HIV Screen 4th Generation wRfx: NONREACTIVE

## 2018-10-08 ENCOUNTER — Encounter (HOSPITAL_COMMUNITY): Payer: Self-pay | Admitting: *Deleted

## 2018-10-08 ENCOUNTER — Other Ambulatory Visit: Payer: Self-pay

## 2018-10-08 ENCOUNTER — Emergency Department (HOSPITAL_COMMUNITY)
Admission: EM | Admit: 2018-10-08 | Discharge: 2018-10-09 | Disposition: A | Discharge status: Home / Self Care | Payer: Self-pay | Attending: Emergency Medicine | Admitting: Emergency Medicine

## 2018-10-08 ENCOUNTER — Emergency Department (HOSPITAL_COMMUNITY): Payer: Self-pay

## 2018-10-08 DIAGNOSIS — Z87891 Personal history of nicotine dependence: Secondary | ICD-10-CM | POA: Insufficient documentation

## 2018-10-08 DIAGNOSIS — K1379 Other lesions of oral mucosa: Secondary | ICD-10-CM | POA: Insufficient documentation

## 2018-10-08 DIAGNOSIS — Z79899 Other long term (current) drug therapy: Secondary | ICD-10-CM | POA: Insufficient documentation

## 2018-10-08 DIAGNOSIS — M329 Systemic lupus erythematosus, unspecified: Secondary | ICD-10-CM | POA: Insufficient documentation

## 2018-10-08 DIAGNOSIS — R079 Chest pain, unspecified: Secondary | ICD-10-CM | POA: Insufficient documentation

## 2018-10-08 DIAGNOSIS — M2550 Pain in unspecified joint: Secondary | ICD-10-CM | POA: Insufficient documentation

## 2018-10-08 LAB — I-STAT BETA HCG BLOOD, ED (MC, WL, AP ONLY): I-stat hCG, quantitative: <5 m[IU]/mL (ref <5)

## 2018-10-08 LAB — CBC WITH DIFFERENTIAL/PLATELET
Abs Immature Granulocytes: 0 10*3/uL (ref 0.00–0.07)
Basophils Absolute: 0 10*3/uL (ref 0.0–0.1)
Basophils Relative: 0 %
Eosinophils Absolute: 0 10*3/uL (ref 0.0–0.5)
Eosinophils Relative: 0 %
HCT: 41.3 % (ref 36.0–46.0)
Hemoglobin: 12.9 g/dL (ref 12.0–15.0)
Immature Granulocytes: 0 %
Lymphocytes Relative: 39 %
Lymphs Abs: 1 10*3/uL (ref 0.7–4.0)
MCH: 27.7 pg (ref 26.0–34.0)
MCHC: 31.2 g/dL (ref 30.0–36.0)
MCV: 88.6 fL (ref 80.0–100.0)
Monocytes Absolute: 0.3 10*3/uL (ref 0.1–1.0)
Monocytes Relative: 12 %
Neutro Abs: 1.2 10*3/uL — ABNORMAL LOW (ref 1.7–7.7)
Neutrophils Relative %: 49 %
Platelets: 260 10*3/uL (ref 150–400)
RBC: 4.66 MIL/uL (ref 3.87–5.11)
RDW: 12.4 % (ref 11.5–15.5)
WBC: 2.5 10*3/uL — ABNORMAL LOW (ref 4.0–10.5)
nRBC: 0 % (ref 0.0–0.2)

## 2018-10-08 LAB — BASIC METABOLIC PANEL
Anion gap: 11 (ref 5–15)
BUN: 16 mg/dL (ref 6–20)
CO2: 20 mmol/L — ABNORMAL LOW (ref 22–32)
Calcium: 9.5 mg/dL (ref 8.9–10.3)
Chloride: 104 mmol/L (ref 98–111)
Creatinine, Ser: 0.68 mg/dL (ref 0.44–1.00)
GFR calc Af Amer: >60 mL/min (ref >60)
GFR calc non Af Amer: >60 mL/min (ref >60)
Glucose, Bld: 114 mg/dL — ABNORMAL HIGH (ref 70–99)
Potassium: 4.3 mmol/L (ref 3.5–5.1)
Sodium: 135 mmol/L (ref 135–145)

## 2018-10-08 LAB — I-STAT TROPONIN, ED: Troponin i, poc: 0 ng/mL (ref 0.00–0.08)

## 2018-10-08 MED ORDER — SODIUM CHLORIDE 0.9 % IV BOLUS
1000.0000 mL | Freq: Once | INTRAVENOUS | Status: AC
  Administered 2018-10-08: 1000 mL via INTRAVENOUS

## 2018-10-08 NOTE — ED Notes (Signed)
Pt very upset that she hasn't been seen by EDP yet. States that her mouth is full of sores and that she needs some Orajel. Pt states she feels like she is being looked at as a "drug seeker" because of that "whole pharmacist phone call". I assured pt that she was not and that the EDPs were currently seeing pt's with a higher acuity of hers. Pt verbalized her understanding.

## 2018-10-08 NOTE — ED Triage Notes (Signed)
Pt c/o weakness, joint pain, swelling, sore in mouth, has hx of lupus and states that this is a flare up,

## 2018-10-08 NOTE — ED Notes (Addendum)
Pt's mother at desk to let this nurse know that pt tried to get in and be seen at both [facility] for further [employer] but that she cannot be seen by either until May. 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 Per pt's mother, pt and pharmacist do not get along. Pt mother stated multiple times this was the pharmacist whom pt formerly worked with at [employer] in [place]. The note states pharmacist called from [facility] but mother kept stating [facility].

## 2018-10-08 NOTE — ED Notes (Signed)
Patient transported to X-ray 

## 2018-10-08 NOTE — ED Provider Notes (Signed)
[facility] EMERGENCY DEPARTMENT Provider Note   CSN: [id number] Arrival date & time: 10/08/18  1925     History   Chief Complaint Chief Complaint  Patient presents with  . Joint Pain    HPI Wendy Herman is a 33 y.o. female.  Patient is a 33 year old female with past medical history of lupus, fibromyalgia, arthritis, and chronic pain.  She presents today for evaluation of joint pain, sores in her mouth and head, chest pain, and feeling generally unwell for the past several days.  Patient has been off of her lupus medications for several weeks.  She denies to me she is experiencing any fevers or chills.  She was admitted here in November with a similar array of symptoms.  From what I can read in the documentation, patient was given prescriptions for her lupus medications upon discharge.  When she went to the pharmacy, there was apparently some sort of incident in which she "got into it" with the pharmacist over her prescriptions.  Apparently she did not want anything filled except for the oxycodone.  The history is provided by the patient.    Past Medical History:  Diagnosis Date  . Arthritis   . Chronic pain   . Fibromyalgia   . Lupus (HCC)   . Neutropenia [facility](HCC)     Patient Active Problem List   Diagnosis Date Noted  . Chest pain 08/27/2018  . Neutropenia (HCC) 08/27/2018  . Anemia 08/27/2018  . SLE (systemic lupus erythematosus) (HCC) 08/27/2018    Past Surgical History:  Procedure Laterality Date  . CESAREAN SECTION       OB History   No obstetric history on file.      Home Medications    Prior to Admission medications   Medication Sig Start Date End Date Taking? Authorizing Provider  Bacillus Coagulans-Inulin (PROBIOTIC FORMULA PO) Take 1-2 capsules by mouth daily.    [provider]  buPROPion (WELLBUTRIN) 100 MG tablet Take 2 tablets (200 mg total) by mouth daily. 08/27/18 09/26/18  Sherryll BurgerShah, Pratik D, DO  Calcium Carb-Cholecalciferol (CALCIUM  600 + D PO) Take 1 tablet by mouth daily.    [provider]  diphenhydrAMINE (BENADRYL) 25 mg capsule Take 200 mg by mouth at bedtime as needed for sleep.    [provider]  leucovorin (WELLCOVORIN) 5 MG tablet Take 2 tablets (10 mg total) by mouth See admin instructions. To take on the same day of each week on the day after taking Methotrexate 08/27/18   Sherryll BurgerShah, Pratik D, DO  methotrexate (RHEUMATREX) 2.5 MG tablet Take 10 tablets (25 mg total) by mouth once a week. Caution:Chemotherapy. Protect from light. 08/27/18   Sherryll BurgerShah, Pratik D, DO  OLANZapine (ZYPREXA) 20 MG tablet Take 2 tablets (40 mg total) by mouth at bedtime. 08/27/18 09/26/18  Sherryll BurgerShah, Pratik D, DO  omeprazole (PRILOSEC) 20 MG capsule Take 40 mg by mouth 2 (two) times daily as needed (for acid reflux). Acid Reflux    [provider]    Family History Family History  Problem Relation Age of Onset  . Hypertension Mother   . Heart attack Father        In his 5650s. Died from it.  . Rheum arthritis Maternal Grandmother   . Lupus Maternal Grandmother   . Diabetes Maternal Grandmother   . Heart disease Maternal Grandfather   . Diabetes Maternal Grandfather   . Breast cancer Maternal Aunt   . Pancreatic cancer Maternal Uncle   . Diabetes  Maternal Aunt   . Diabetes Maternal Aunt     Social History Social History   Tobacco Use  . Smoking status: Former Games developer  . Smokeless tobacco: Never Used  Substance Use Topics  . Alcohol use: No  . Drug use: No     Allergies   Codeine; Nsaids; and Sulfa antibiotics   Review of Systems Review of Systems  All other systems reviewed and are negative.    Physical Exam Updated Vital Signs BP (!) 186/99   Pulse (!) 122   Temp 99.6 F (37.6 C) (Oral)   Resp (!) 22   Ht 5\' 3"  (1.6 m)   Wt 67.1 kg   LMP 09/12/2018   SpO2 100%   BMI 26.22 kg/m   Physical Exam Vitals signs and nursing note reviewed.  Constitutional:      General: She is not in acute  distress.    Appearance: She is well-developed. She is not diaphoretic.  HENT:     Head: Normocephalic and atraumatic.     Comments: Patient does have alopecia, however I see no sores to the scalp.    Mouth/Throat:     Mouth: Mucous membranes are moist.     Pharynx: No oropharyngeal exudate or posterior oropharyngeal erythema.     Comments: There are very small aphthuous appearing lesions under the tongue.   Neck:     Musculoskeletal: Normal range of motion and neck supple.  Cardiovascular:     Rate and Rhythm: Normal rate and regular rhythm.     Heart sounds: No murmur. No friction rub. No gallop.   Pulmonary:     Effort: Pulmonary effort is normal. No respiratory distress.     Breath sounds: Normal breath sounds. No wheezing.  Abdominal:     General: Bowel sounds are normal. There is no distension.     Palpations: Abdomen is soft.     Tenderness: There is no abdominal tenderness.  Musculoskeletal: Normal range of motion.  Skin:    General: Skin is warm and dry.  Neurological:     Mental Status: She is alert and oriented to person, place, and time.      ED Treatments / Results  Labs (all labs ordered are listed, but only abnormal results are displayed) Labs Reviewed  CBC WITH DIFFERENTIAL/PLATELET - Abnormal; Notable for the following components:      Result Value   WBC 2.5 (*)    Neutro Abs 1.2 (*)    All other components within normal limits  BASIC METABOLIC PANEL - Abnormal; Notable for the following components:   CO2 20 (*)    Glucose, Bld 114 (*)    All other components within normal limits  I-STAT TROPONIN, ED  I-STAT BETA HCG BLOOD, ED ([facility], [facility], [facility] ONLY)    EKG None  Radiology No results found.  Procedures Procedures (including critical care time)  Medications Ordered in ED Medications  sodium chloride 0.9 % bolus 1,000 mL (has no administration in time range)     Initial Impression / Assessment and Plan / ED Course  I have reviewed the triage  vital signs and the nursing notes.  Pertinent labs & imaging results that were available during my care of the patient were reviewed by me and considered in my medical decision making (see chart for details).  Patient with history of lupus presenting with multiple complaints that appear to be a flareup of her lupus.  She has been off of her medications for several weeks as  she moved here from [place] and has no rheumatologist here.  She was admitted in November with similar complaints.  Today's work-up is unremarkable including laboratory studies, EKG, chest x-ray.  She was given normal saline and observed for several hours.  She has been somewhat tachycardic, however this does appear to be her baseline.  At this point, I see no indication for admission.  I have agreed to refill her prescriptions.  Her and her mother will make arrangements to have these filled tomorrow.  Final Clinical Impressions(s) / ED Diagnoses   Final diagnoses:  None    ED Discharge Orders    None       Geoffery Lyons, MD 10/09/18 [phone]

## 2018-10-09 MED ORDER — CLONIDINE HCL 0.2 MG PO TABS: 0.2000 mg | ORAL_TABLET | Freq: Every day | ORAL | 0 refills | Status: DC

## 2018-10-09 MED ORDER — PREDNISONE 10 MG PO TABS: 20.0000 mg | ORAL_TABLET | Freq: Two times a day (BID) | ORAL | 0 refills | Status: DC

## 2018-10-09 MED ORDER — HYDROXYCHLOROQUINE SULFATE 200 MG PO TABS: 400.0000 mg | ORAL_TABLET | Freq: Every day | ORAL | 0 refills | Status: DC

## 2018-10-09 MED ORDER — LAMOTRIGINE 150 MG PO TABS: 150.0000 mg | ORAL_TABLET | Freq: Every day | ORAL | 0 refills | Status: DC

## 2018-10-09 MED ORDER — OXYCODONE-ACETAMINOPHEN 10-325 MG PO TABS: 1.0000 | ORAL_TABLET | Freq: Four times a day (QID) | ORAL | 0 refills | Status: DC | PRN

## 2018-10-09 MED ORDER — METHYLPREDNISOLONE SODIUM SUCC 125 MG IJ SOLR
125.0000 mg | Freq: Once | INTRAMUSCULAR | Status: AC
  Administered 2018-10-09: 125 mg via INTRAVENOUS
  Filled 2018-10-09: qty 2

## 2018-10-09 MED ORDER — OLANZAPINE 20 MG PO TABS: 40.0000 mg | ORAL_TABLET | Freq: Every day | ORAL | 0 refills | Status: DC

## 2018-10-09 NOTE — Discharge Instructions (Addendum)
Resume taking medications as previously prescribed.  You have been given refills of these medications, however future prescriptions should come from a primary doctor or rheumatologist.  Return to the emergency department if your symptoms significantly worsen or change.

## 2018-11-29 ENCOUNTER — Other Ambulatory Visit: Payer: Self-pay

## 2018-11-29 ENCOUNTER — Emergency Department (HOSPITAL_COMMUNITY)
Admission: EM | Admit: 2018-11-29 | Discharge: 2018-11-29 | Disposition: A | Discharge status: Home / Self Care | Payer: Self-pay | Attending: Emergency Medicine | Admitting: Emergency Medicine

## 2018-11-29 ENCOUNTER — Encounter (HOSPITAL_COMMUNITY): Payer: Self-pay | Admitting: *Deleted

## 2018-11-29 DIAGNOSIS — M329 Systemic lupus erythematosus, unspecified: Secondary | ICD-10-CM | POA: Insufficient documentation

## 2018-11-29 DIAGNOSIS — Z79899 Other long term (current) drug therapy: Secondary | ICD-10-CM | POA: Insufficient documentation

## 2018-11-29 DIAGNOSIS — Z87891 Personal history of nicotine dependence: Secondary | ICD-10-CM | POA: Insufficient documentation

## 2018-11-29 MED ORDER — MORPHINE SULFATE 30 MG PO TABS
30.0000 mg | ORAL_TABLET | ORAL | Status: DC | PRN
  Administered 2018-11-29: 30 mg via ORAL
  Filled 2018-11-29: qty 1

## 2018-11-29 MED ORDER — OXYCODONE-ACETAMINOPHEN 10-325 MG PO TABS: 1.0000 | ORAL_TABLET | Freq: Three times a day (TID) | ORAL | 0 refills | Status: AC | PRN

## 2018-11-29 MED ORDER — HYDROXYCHLOROQUINE SULFATE 200 MG PO TABS: 400.0000 mg | ORAL_TABLET | Freq: Every day | ORAL | 1 refills | Status: DC

## 2018-11-29 MED ORDER — DEXAMETHASONE SODIUM PHOSPHATE 10 MG/ML IJ SOLN
20.0000 mg | Freq: Once | INTRAMUSCULAR | Status: AC
  Administered 2018-11-29: 20 mg via INTRAMUSCULAR
  Filled 2018-11-29: qty 2

## 2018-11-29 MED ORDER — PREDNISONE 10 MG PO TABS: 20.0000 mg | ORAL_TABLET | Freq: Two times a day (BID) | ORAL | 0 refills | Status: DC

## 2018-11-29 MED ORDER — LORAZEPAM 1 MG PO TABS: 1.0000 mg | ORAL_TABLET | Freq: Every day | ORAL | 0 refills | Status: DC

## 2018-11-29 NOTE — ED Notes (Signed)
Pt concerned with not being able to find a primary care doctor to manage her lupus. Pt states she has to go around to different pharmacies to have different prescriptions filled due to cost. Pt would like assistance finding a doctor to help her manage her pain so she doesn't have to utilize ED resources.

## 2018-11-29 NOTE — Discharge Instructions (Signed)
Please follow-up with: [facility] to see if they can provide you more resources on insurance and establish primary care.

## 2018-11-29 NOTE — ED Notes (Signed)
ED Provider at bedside. 

## 2018-11-29 NOTE — ED Provider Notes (Signed)
[facility]-EMERGENCY DEPT Provider Note   CSN: [id number] Arrival date & time: 11/29/18  2006    History   Chief Complaint Chief Complaint  Patient presents with  . Generalized Body Aches  . Shortness of Breath    HPI Wendy Herman is a 33 y.o. female.     HPI 33 year old female with history of lupus comes in with chief complaint of rash, shortness of breath and body aches.  Patient states that her symptoms are typical of lupus flare.  Her rash has gotten little worse over the past few days. Unfortunately patient is new to [place].  While in [place] she was on Plaquenil, prednisone, methotrexate.  She now is certain that she will not be returning to [place].  Patient does not have insurance and does not have follow-up.  Review of system is negative for any nausea, vomiting, fevers, chills, dizziness, near fainting, chest pains.  Patient is absolutely sure she is not pregnant and is comfortable not being tested - as she was on Plaquenil and prednisone while she was pregnant anyways.  Past Medical History:  Diagnosis Date  . Arthritis   . Chronic pain   . Fibromyalgia   . Lupus (HCC)   . Neutropenia [facility])     Patient Active Problem List   Diagnosis Date Noted  . Chest pain 08/27/2018  . Neutropenia (HCC) 08/27/2018  . Anemia 08/27/2018  . SLE (systemic lupus erythematosus) (HCC) 08/27/2018    Past Surgical History:  Procedure Laterality Date  . CESAREAN SECTION       OB History   No obstetric history on file.      Home Medications    Prior to Admission medications   Medication Sig Start Date End Date Taking? Authorizing Provider  Bacillus Coagulans-Inulin (PROBIOTIC FORMULA PO) Take 1-2 capsules by mouth daily.   Yes [provider]  Calcium Carb-Cholecalciferol (CALCIUM 600 + D PO) Take 1 tablet by mouth daily.   Yes [provider]  cloNIDine (CATAPRES) 0.2 MG tablet Take 1 tablet (0.2 mg total) by  mouth daily. 10/09/18  Yes Delo, Riley Lam, MD  dapsone 100 MG tablet Take 100 mg by mouth daily.   Yes [provider]  diazepam (VALIUM) 10 MG tablet Take 10 mg by mouth daily.   Yes [provider]  diphenhydrAMINE (BENADRYL) 25 mg capsule Take 200 mg by mouth at bedtime as needed for sleep.   Yes [provider]  lamoTRIgine (LAMICTAL) 150 MG tablet Take 1 tablet (150 mg total) by mouth daily. 10/09/18  Yes Delo, Riley Lam, MD  leucovorin (WELLCOVORIN) 5 MG tablet Take 2 tablets (10 mg total) by mouth See admin instructions. To take on the same day of each week on the day after taking Methotrexate 08/27/18  Yes Sherryll Burger, Pratik D, DO  methotrexate (RHEUMATREX) 2.5 MG tablet Take 10 tablets (25 mg total) by mouth once a week. Caution:Chemotherapy. Protect from light. 08/27/18  Yes Shah, Pratik D, DO  OLANZapine (ZYPREXA) 20 MG tablet Take 2 tablets (40 mg total) by mouth at bedtime. 10/09/18  Yes Delo, Riley Lam, MD  omeprazole (PRILOSEC) 20 MG capsule Take 40 mg by mouth 2 (two) times daily as needed (for acid reflux). Acid Reflux   Yes [provider]  buPROPion (WELLBUTRIN) 100 MG tablet Take 2 tablets (200 mg total) by mouth daily. Patient not taking: Reported on 11/29/2018 08/27/18 09/26/18  Maurilio Lovely D, DO  hydroxychloroquine (PLAQUENIL) 200 MG tablet Take 2 tablets (400  mg total) by mouth daily. 11/29/18   Derwood Kaplan, MD  oxyCODONE-acetaminophen (PERCOCET) 10-325 MG tablet Take 1 tablet by mouth every 8 (eight) hours as needed for up to 5 days for pain. 11/29/18 12/04/18  Derwood Kaplan, MD  predniSONE (DELTASONE) 10 MG tablet Take 2 tablets (20 mg total) by mouth 2 (two) times daily. 11/29/18   Derwood Kaplan, MD    Family History Family History  Problem Relation Age of Onset  . Hypertension Mother   . Heart attack Father        In his 77s. Died from it.  . Rheum arthritis Maternal Grandmother   . Lupus Maternal Grandmother   . Diabetes Maternal Grandmother    . Heart disease Maternal Grandfather   . Diabetes Maternal Grandfather   . Breast cancer Maternal Aunt   . Pancreatic cancer Maternal Uncle   . Diabetes Maternal Aunt   . Diabetes Maternal Aunt     Social History Social History   Tobacco Use  . Smoking status: Former Games developer  . Smokeless tobacco: Never Used  Substance Use Topics  . Alcohol use: No  . Drug use: No     Allergies   Codeine; Nsaids; and Sulfa antibiotics   Review of Systems Review of Systems  Constitutional: Positive for activity change.  Respiratory: Positive for shortness of breath.   Musculoskeletal: Positive for myalgias.  Skin: Positive for rash.     Physical Exam Updated Vital Signs BP (!) 151/96   Pulse (!) 101   Temp 98.6 F (37 C) (Oral)   Resp 18   Ht 5\' 4"  (1.626 m)   Wt 68 kg   LMP 11/12/2018   SpO2 96%   BMI 25.75 kg/m   Physical Exam Vitals signs and nursing note reviewed.  Constitutional:      Appearance: She is well-developed.  HENT:     Head: Normocephalic and atraumatic.  Neck:     Musculoskeletal: Normal range of motion and neck supple.  Cardiovascular:     Rate and Rhythm: Normal rate.  Pulmonary:     Effort: Pulmonary effort is normal.  Abdominal:     General: Bowel sounds are normal.  Skin:    General: Skin is warm and dry.     Findings: Rash present.  Neurological:     Mental Status: She is alert and oriented to person, place, and time.      ED Treatments / Results  Labs (all labs ordered are listed, but only abnormal results are displayed) Labs Reviewed - No data to display  EKG None  Radiology No results found.  Procedures Procedures (including critical care time)  Medications Ordered in ED Medications  morphine (MSIR) tablet 30 mg (30 mg Oral Given 11/29/18 2146)  dexamethasone (DECADRON) injection 20 mg (20 mg Intramuscular Given 11/29/18 2146)     Initial Impression / Assessment and Plan / ED Course  I have reviewed the triage vital  signs and the nursing notes.  Pertinent labs & imaging results that were available during my care of the patient were reviewed by me and considered in my medical decision making (see chart for details).        33 year old female comes in a chief complaint of rash, shortness of breath and body aches.  She has history of lupus that is poorly controlled because she does not have follow-up or insurance in [place].  This is the third visit in the ER over the past 3 months for same complaint.  She states that normally when she starts having flareup she would come to the ER for prescriptions.  Until now patient was thinking that she will be moving back to [place], but now she is certain that she will not be moving to [place] and wants to know if there are any resources available for her.  We will give patient information on [facility]. I informed her that there is a possibility that her need for rheumatologic care might not be possible to deliver at [facility], and therefore to consider regional medical centers.  Patient states that she is already started researching [facility], and will be calling there lupus clinic.  Based on our evaluation there does not appear to be severe flare that needs emergent lab work-up or evaluation.  Labs from prior visit reviewed and patient had normal renal function and normal CBC.  Patient is comfortable not getting any repeat lab work-up.  Stable for discharge.  We will discharge her with pain medications, steroids and Plaquenil.  [place] controlled substance database reviewed.  Final Clinical Impressions(s) / ED Diagnoses   Final diagnoses:  SLE (systemic lupus erythematosus related syndrome) [facility])    ED Discharge Orders         Ordered    hydroxychloroquine (PLAQUENIL) 200 MG tablet  Daily     11/29/18 2314    predniSONE (DELTASONE) 10 MG tablet  2 times daily     11/29/18 2314    oxyCODONE-acetaminophen (PERCOCET) 10-325 MG tablet  Every 8  hours PRN     11/29/18 2314           Derwood Kaplan, MD 11/29/18 2321

## 2018-12-07 ENCOUNTER — Encounter: Payer: Self-pay | Admitting: Family Medicine

## 2018-12-07 ENCOUNTER — Ambulatory Visit (INDEPENDENT_AMBULATORY_CARE_PROVIDER_SITE_OTHER): Payer: Self-pay | Admitting: Family Medicine

## 2018-12-07 ENCOUNTER — Other Ambulatory Visit: Payer: Self-pay

## 2018-12-07 VITALS — BP 162/90 | HR 78 | Temp 97.8°F | Ht 63.0 in | Wt 148.0 lb

## 2018-12-07 DIAGNOSIS — F310 Bipolar disorder, current episode hypomanic: Secondary | ICD-10-CM

## 2018-12-07 DIAGNOSIS — R030 Elevated blood-pressure reading, without diagnosis of hypertension: Secondary | ICD-10-CM

## 2018-12-07 DIAGNOSIS — M329 Systemic lupus erythematosus, unspecified: Secondary | ICD-10-CM

## 2018-12-07 DIAGNOSIS — G4700 Insomnia, unspecified: Secondary | ICD-10-CM

## 2018-12-07 LAB — POCT URINALYSIS DIP (MANUAL ENTRY)
Bilirubin, UA: NEGATIVE
Blood, UA: NEGATIVE
Glucose, UA: NEGATIVE mg/dL
Ketones, POC UA: NEGATIVE mg/dL
Leukocytes, UA: NEGATIVE
Nitrite, UA: NEGATIVE
Protein Ur, POC: 30 mg/dL — AB
Spec Grav, UA: 1.025 (ref 1.010–1.025)
Urobilinogen, UA: 0.2 E.U./dL
pH, UA: 6.5 (ref 5.0–8.0)

## 2018-12-07 LAB — POCT URINE PREGNANCY: Preg Test, Ur: NEGATIVE

## 2018-12-07 MED ORDER — LEUCOVORIN CALCIUM 5 MG PO TABS: 10.0000 mg | ORAL_TABLET | ORAL | 0 refills | Status: DC

## 2018-12-07 MED ORDER — HYDROXYCHLOROQUINE SULFATE 200 MG PO TABS: 400.0000 mg | ORAL_TABLET | Freq: Every day | ORAL | 1 refills | Status: DC

## 2018-12-07 MED ORDER — LAMOTRIGINE 150 MG PO TABS: 150.0000 mg | ORAL_TABLET | Freq: Every day | ORAL | 2 refills | Status: DC

## 2018-12-07 MED ORDER — HYDROXYZINE PAMOATE 100 MG PO CAPS: 100.0000 mg | ORAL_CAPSULE | Freq: Every day | ORAL | 2 refills | Status: DC

## 2018-12-07 MED ORDER — PREDNISONE 10 MG PO TABS: 20.0000 mg | ORAL_TABLET | Freq: Two times a day (BID) | ORAL | 0 refills | Status: DC

## 2018-12-07 MED ORDER — OLANZAPINE 20 MG PO TABS: 20.0000 mg | ORAL_TABLET | Freq: Every day | ORAL | 2 refills | Status: DC

## 2018-12-07 MED ORDER — CLONIDINE HCL 0.2 MG PO TABS: 0.2000 mg | ORAL_TABLET | Freq: Every day | ORAL | 2 refills | Status: DC

## 2018-12-07 MED ORDER — METHOTREXATE 2.5 MG PO TABS: 25.0000 mg | ORAL_TABLET | ORAL | 0 refills | Status: DC

## 2018-12-07 MED FILL — lamoTRIgine 150 MG TABS: 150 | 30 days supply | Qty: 30 | Fill #0

## 2018-12-07 MED FILL — cloNIDine HCL 0.2 MG TABS: 0.2 | 30 days supply | Qty: 30 | Fill #0

## 2018-12-07 MED FILL — OLANZapine 20 MG TABS: 20 | 30 days supply | Qty: 30 | Fill #0

## 2018-12-07 MED FILL — predniSONE 10 MG TABS: 10 | 30 days supply | Qty: 120 | Fill #0

## 2018-12-07 NOTE — Patient Instructions (Signed)
[facility] Address: [address] [place], [place], [place] [address] Phone: [phone] It is walk in. Go there today.     Bipolar 1 Disorder Bipolar 1 disorder is a mental health disorder in which a person has episodes of emotional highs (mania), and may also have episodes of emotional lows (depression) in addition to highs. Bipolar 1 disorder is different from other bipolar disorders because it involves extreme manic episodes. These episodes last at least one week or involve symptoms that are so severe that hospitalization is needed to keep the person safe. What increases the risk? The cause of this condition is not known. However, certain factors make you more likely to have bipolar disorder, such as:  Having a family member with the disorder.  An imbalance of certain chemicals in the brain (neurotransmitters).  Stress, such as illness, financial problems, or a death.  Certain conditions that affect the brain or spinal cord (neurologic conditions).  Brain injury (trauma).  Having another mental health disorder, such as: ? Obsessive compulsive disorder. ? Schizophrenia. What are the signs or symptoms? Symptoms of mania include:  Very high self-esteem or self-confidence.  Decreased need for sleep.  Unusual talkativeness or feeling a need to keep talking. Speech may be very fast. It may seem like you cannot stop talking.  Racing thoughts or constant talking, with quick shifts between topics that may or may not be related (flight of ideas).  Decreased ability to focus or concentrate.  Increased purposeful activity, such as work, studies, or social activity.  Increased nonproductive activity. This could be pacing, squirming and fidgeting, or finger and toe tapping.  Impulsive behavior and poor judgment. This may result in high-risk activities, such as having unprotected sex or spending a lot of money. Symptoms of depression include:  Feeling  sad, hopeless, or helpless.  Frequent or uncontrollable crying.  Lack of feeling or caring about anything.  Sleeping too much.  Moving more slowly than usual.  Not being able to enjoy things you used to enjoy.  Wanting to be alone all the time.  Feeling guilty or worthless.  Lack of energy or motivation.  Trouble concentrating or remembering.  Trouble making decisions.  Increased appetite.  Thoughts of death, or the desire to harm yourself. Sometimes, you may have a mixed mood. This means having symptoms of depression and mania. Stress can make symptoms worse. How is this diagnosed? To diagnose bipolar disorder, your health care provider may ask about your:  Emotional episodes.  Medical history.  Alcohol and drug use. This includes prescription medicines. Certain medical conditions and substances can cause symptoms that seem like bipolar disorder (secondary bipolar disorder). How is this treated? Bipolar disorder is a long-term (chronic) illness. It is best controlled with ongoing (continuous) treatment rather than treatment only when symptoms occur. Treatment may include:  Medicine. Medicine can be prescribed by a provider who specializes in treating mental disorders (psychiatrist). ? Medicines called mood stabilizers are usually prescribed. ? If symptoms occur even while taking a mood stabilizer, other medicines may be added.  Psychotherapy. Some forms of talk therapy, such as cognitive-behavioral therapy (CBT), can provide support, education, and guidance.  Coping methods, such as journaling or relaxation exercises. These may include: ? Yoga. ? Meditation. ? Deep breathing.  Lifestyle changes, such as: ? Limiting alcohol and drug use. ? Exercising regularly. ? Getting plenty of sleep. ? Making healthy eating choices.  A combination of medicine, talk therapy, and coping methods is best.  A procedure in which electricity is applied to the brain through the scalp  (electroconvulsive therapy) may be used in cases of severe mania when medicine and psychotherapy work too slowly or do not work. Follow these instructions at home: Activity   Return to your normal activities as told by your health care provider.  Find activities that you enjoy, and make time to do them.  Exercise regularly as told by your health care provider. Lifestyle  Limit alcohol intake to no more than 1 drink a day for nonpregnant women and 2 drinks a day for men. One drink equals 12 oz of beer, 5 oz of wine, or 1 oz of hard liquor.  Follow a set schedule for eating and sleeping.  Eat a balanced diet that includes fresh fruits and vegetables, whole grains, low-fat dairy, and lean meat.  Get 7-8 hours of sleep each night. General instructions  Take over-the-counter and prescription medicines only as told by your health care provider.  Think about joining a support group. Your health care provider may be able to recommend a support group.  Talk with your family and loved ones about your treatment goals and how they can help.  Keep all follow-up visits as told by your health care provider. This is important. Where to find more information For more information about bipolar disorder, visit the following websites:  [employer] on Mental Illness: www.nami.org  [employer] of Mental Health: http://www.maynard.net/ Contact a health care provider if:  Your symptoms get worse.  You have side effects from your medicine, and they get worse.  You have trouble sleeping.  You have trouble doing daily activities.  You feel unsafe in your surroundings.  You are dealing with substance abuse. Get help right away if:  You have new symptoms.  You have thoughts about harming yourself.  You self-harm. This information is not intended to replace advice given to you by your health care provider. Make sure you discuss any questions you have with your health care  provider. Document Released: 12/26/2000 Document Revised: 05/15/2016 Document Reviewed: 05/19/2016 Elsevier Interactive Patient Education  [employer].

## 2018-12-07 NOTE — Progress Notes (Signed)
[facility]  New Patient Encounter Provider: Mike Gip, FNP    JIR:[id number]  BPZ:[id number]  DOB - 1986/01/02  SUBJECTIVE:   Wendy Herman, is a 33 y.o. female who presents to establish care with this clinic.   Current problems/concerns:   Patient with a significant psychiatric history that includes bipolar type 1 disorder, OCD, who  relocated from [place] 5 months ago. Patient states that she has not had her medications on a consistent basis since leaving [place]. Patient has been to the ED 3 times in the past 6 months for her medications due to not having a PCP.  Patient states that she has not slept in 2 days. Patient also reports a history of lupus and fibromyalgia, arthritis, htn,  Patient reports living in [facility]. She does not work and does not have transportation.   Allergies  Allergen Reactions  . Codeine Swelling  . Nsaids Other (See Comments)    Interacts with GERD  . Sulfa Antibiotics Swelling   Past Medical History:  Diagnosis Date  . Arthritis   . Chronic pain   . Fibromyalgia   . Lupus (HCC)   . Neutropenia (HCC)    Current Outpatient Medications on File Prior to Visit  Medication Sig Dispense Refill  . omeprazole (PRILOSEC) 20 MG capsule Take 40 mg by mouth 2 (two) times daily as needed (for acid reflux). Acid Reflux    . Bacillus Coagulans-Inulin (PROBIOTIC FORMULA PO) Take 1-2 capsules by mouth daily.    . Calcium Carb-Cholecalciferol (CALCIUM 600 + D PO) Take 1 tablet by mouth daily.    . dapsone 100 MG tablet Take 100 mg by mouth daily.    . diphenhydrAMINE (BENADRYL) 25 mg capsule Take 200 mg by mouth at bedtime as needed for sleep.     No current facility-administered medications on file prior to visit.    Family History  Problem Relation Age of Onset  . Hypertension Mother   . Heart attack Father        In his 33s. Died from it.  . Rheum arthritis Maternal Grandmother   .  Lupus Maternal Grandmother   . Diabetes Maternal Grandmother   . Heart disease Maternal Grandfather   . Diabetes Maternal Grandfather   . Breast cancer Maternal Aunt   . Pancreatic cancer Maternal Uncle   . Diabetes Maternal Aunt   . Diabetes Maternal Aunt    Social History   Socioeconomic History  . Marital status: Legally Separated    Spouse name: Not on file  . Number of children: Not on file  . Years of education: Not on file  . Highest education level: Not on file  Occupational History  . Not on file  Social Needs  . Financial resource strain: Not on file  . Food insecurity:    Worry: Not on file    Inability: Not on file  . Transportation needs:    Medical: Not on file    Non-medical: Not on file  Tobacco Use  . Smoking status: Former Games developer  . Smokeless tobacco: Never Used  Substance and Sexual Activity  . Alcohol use: No  . Drug use: No  . Sexual activity: Never  Lifestyle  . Physical activity:    Days per week: Not on file    Minutes per session: Not on file  . Stress: Not on file  Relationships  . Social connections:    Talks on phone: Not on file  Gets together: Not on file    Attends religious service: Not on file    Active member of club or organization: Not on file    Attends meetings of clubs or organizations: Not on file    Relationship status: Not on file  . Intimate partner violence:    Fear of current or ex partner: Not on file    Emotionally abused: Not on file    Physically abused: Not on file    Forced sexual activity: Not on file  Other Topics Concern  . Not on file  Social History Narrative  . Not on file    Review of Systems  Constitutional: Negative.   HENT: Negative.   Eyes: Negative.   Respiratory: Negative.   Cardiovascular: Negative.   Gastrointestinal: Negative.   Genitourinary: Negative.   Musculoskeletal: Positive for joint pain and myalgias.  Skin: Negative.   Neurological: Negative.   Psychiatric/Behavioral:  Negative for depression and suicidal ideas. The patient is nervous/anxious and has insomnia.      OBJECTIVE:    BP (!) 162/90 (BP Location: Left Arm, Patient Position: Sitting, Cuff Size: Normal)   Pulse 78   Temp 97.8 F (36.6 C) (Oral)   Ht 5\' 3"  (1.6 m)   Wt 148 lb (67.1 kg)   LMP 11/12/2018   SpO2 100%   BMI 26.22 kg/m   Physical Exam  Constitutional: She is oriented to person, place, and time and well-developed, well-nourished, and in no distress. No distress.  HENT:  Head: Normocephalic and atraumatic.  Eyes: Pupils are equal, round, and reactive to light. Conjunctivae and EOM are normal.  Neck: Normal range of motion. No thyromegaly present.  Cardiovascular: Normal rate and normal heart sounds.  No murmur heard. Tachycardic   Pulmonary/Chest: Effort normal and breath sounds normal.  Abdominal: Soft. Bowel sounds are normal.  Musculoskeletal: Normal range of motion.  Neurological: She is alert and oriented to person, place, and time.  Skin: Skin is warm and dry.  Psychiatric: Memory normal. Her mood appears anxious. Her affect is labile. She expresses impulsivity.  Rapid speech.      ASSESSMENT/PLAN: 1. Bipolar affective disorder, current episode hypomanic (HCC) - hydrOXYzine (VISTARIL) 100 MG capsule; Take 1 capsule (100 mg total) by mouth at bedtime.  Dispense: 30 capsule; Refill: 2 - lamoTRIgine (LAMICTAL) 150 MG tablet; Take 1 tablet (150 mg total) by mouth daily.  Dispense: 30 tablet; Refill: 2 - OLANZapine (ZYPREXA) 20 MG tablet; Take 1 tablet (20 mg total) by mouth at bedtime.  Dispense: 30 tablet; Refill: 2 - POCT urine pregnancy  2. Elevated blood pressure reading - POCT urinalysis dipstick - cloNIDine (CATAPRES) 0.2 MG tablet; Take 1 tablet (0.2 mg total) by mouth at bedtime.  Dispense: 30 tablet; Refill: 2  3. Insomnia, unspecified type - cloNIDine (CATAPRES) 0.2 MG tablet; Take 1 tablet (0.2 mg total) by mouth at bedtime.  Dispense: 30 tablet; Refill:  2  4. Systemic lupus erythematosus, unspecified SLE type, unspecified organ involvement status (HCC) - ANA,IFA RA Diag Pnl w/rflx Tit/Patn - Comprehensive metabolic panel - CBC with Differential - hydroxychloroquine (PLAQUENIL) 200 MG tablet; Take 2 tablets (400 mg total) by mouth daily.  Dispense: 30 tablet; Refill: 1 - predniSONE (DELTASONE) 10 MG tablet; Take 2 tablets (20 mg total) by mouth 2 (two) times daily with a meal for 30 days.  Dispense: 120 tablet; Refill: 0 - methotrexate (RHEUMATREX) 2.5 MG tablet; Take 10 tablets (25 mg total) by mouth once a week. Caution:Chemotherapy. Protect  from light.  Dispense: 40 tablet; Refill: 0 - leucovorin (WELLCOVORIN) 5 MG tablet; Take 2 tablets (10 mg total) by mouth See admin instructions. To take on the same day of each week on the day after taking Methotrexate  Dispense: 8 tablet; Refill: 0    Return in about 2 weeks (around 12/21/2018) for Multiple concerns.  The patient was given clear instructions to go to ER or return to medical center if symptoms don't improve, worsen or new problems develop. The patient verbalized understanding. The patient was told to call to get lab results if they haven't heard anything in the next week.     This note has been created with Education officer, environmental. Any transcriptional errors are unintentional.   Ms. Andr L. Riley Lam, FNP-BC [facility] [address] [place], [place] [address] [phone]

## 2018-12-10 ENCOUNTER — Encounter: Payer: Self-pay | Admitting: Family Medicine

## 2018-12-10 ENCOUNTER — Other Ambulatory Visit: Payer: Self-pay

## 2018-12-10 ENCOUNTER — Ambulatory Visit: Payer: Self-pay | Admitting: Family Medicine

## 2018-12-10 ENCOUNTER — Ambulatory Visit (INDEPENDENT_AMBULATORY_CARE_PROVIDER_SITE_OTHER): Payer: Self-pay | Admitting: Family Medicine

## 2018-12-10 VITALS — BP 144/97 | HR 100 | Temp 98.5°F | Wt 148.0 lb

## 2018-12-10 DIAGNOSIS — R030 Elevated blood-pressure reading, without diagnosis of hypertension: Secondary | ICD-10-CM

## 2018-12-10 DIAGNOSIS — F310 Bipolar disorder, current episode hypomanic: Secondary | ICD-10-CM

## 2018-12-10 DIAGNOSIS — L930 Discoid lupus erythematosus: Secondary | ICD-10-CM

## 2018-12-10 DIAGNOSIS — M329 Systemic lupus erythematosus, unspecified: Secondary | ICD-10-CM

## 2018-12-10 MED ORDER — FLUOCINONIDE 0.05 % EX OINT: 1.0000 "application " | TOPICAL_OINTMENT | Freq: Two times a day (BID) | CUTANEOUS | 0 refills | Status: DC

## 2018-12-10 MED ORDER — TRAMADOL HCL 50 MG PO TABS: 50.0000 mg | ORAL_TABLET | Freq: Three times a day (TID) | ORAL | 0 refills | Status: DC | PRN

## 2018-12-10 NOTE — Patient Instructions (Addendum)
[facility] Address: [address] [place], [place], [place] [address] Phone: [phone] It is walk in. Go there today.   Systemic Lupus Erythematosus, Adult Systemic lupus erythematosus (SLE) is a long-term (chronic) disease that can affect many parts of the body. SLE is an autoimmune disease. With this type of disease, the body's defense system (immune system) mistakenly attacks healthy tissues. This can cause damage to the skin, joints, blood vessels, brain, kidneys, lungs, heart, and other internal organs. It causes pain, irritation, and inflammation. What are the causes? The cause of this condition is not known. What increases the risk? The following factors may make you more likely to develop this condition:  Being female.  Being of Asian, Hispanic, or African-American descent.  Having a family history of the condition.  Being exposed to tobacco smoke or smoking cigarettes.  Having an infection with a virus, such as Epstein-Barr virus.  Having a history of exposure to silica dust, metals, chemicals, mold or mildew, or insecticides.  Using oral contraceptives or hormone replacement therapy. What are the signs or symptoms? This condition can affect almost any organ or system in the body. Symptoms of the condition depend on which organ or system is affected. The most common symptoms include:  Fever.  Fatigue.  Weight loss.  Muscle aches.  Joint pain.  Skin rashes, especially over the nose and cheeks (butterfly rash) and after sun exposure. Symptoms can come and go. A period of time when symptoms get worse or come back is called a flare. A period of time with no symptoms is called a remission. How is this diagnosed? This condition is diagnosed based on:  Your symptoms.  Your medical history.  A physical exam. You may also have tests, including:  Blood tests.  Urine tests.  A chest X-ray. You may be referred to an autoimmune  disease specialist (rheumatologist). How is this treated? There is no cure for this condition, but treatment can help to control symptoms, prevent flares (keep symptoms in remission), and prevent damage to the heart, lungs, kidneys, and other organs. Treatment will depend on what symptoms you are having and what organs or systems are affected. Treatment may involve taking a combination of medicines over time. Common medicines used to treat this condition include:  Antimalarial medicines to control symptoms, prevent flares, and protect against organ damage.  Corticosteroids and NSAIDs to reduce inflammation.  Medicines to weaken your immune system (immunosuppressants).  Biologic response modifiers to reduce inflammation and damage. Follow these instructions at home: Eating and drinking  Eat a heart-healthy diet. This may include: ? Eating high-fiber foods, such as fresh fruits and vegetables, whole grains, and beans. ? Eating heart-healthy fats (omega-3 fats), such as fish, flaxseed, and flaxseed oil. ? Limiting foods that are high in saturated fat and cholesterol, such as processed and fried foods, fatty meat, and full-fat dairy. ? Limiting how much salt (sodium) you eat.  Include calcium and vitamin D in your diet. Good sources of calcium and vitamin D include: ? Low-fat dairy products such as milk, yogurt, and cheese. ? Certain fish, such as fresh or canned salmon, tuna, and sardines. ? Products that have calcium and vitamin D added to them (fortified products), such as fortified cereals or juice. Medicines  Take over-the-counter and prescription medicines only as told by your health care provider.  Do not take any medicines that contain estrogen without first checking with your health care provider. Estrogen can trigger flares and may  increase your risk for blood clots. Lifestyle      Stay active, as directed by your health care provider.  Do not use any products that contain  nicotine or tobacco, such as cigarettes and e-cigarettes. If you need help quitting, ask your health care provider.  Protect your skin from the sun by applying sunblock and wearing protective hats and clothing.  Learn as much as you can about your condition and have a good support system in place. Support may come from family, friends, or a lupus support group. General instructions  Work closely with all of your health care providers to manage your condition.  Stay up to date on all vaccines as directed by your health care provider.  Keep all follow-up visits as told by your health care provider. This is important. Contact a health care provider if:  You have a fever.  Your symptoms flare.  You develop new symptoms.  You have bloody, foamy, or coffee-colored urine.  There are changes in your urination. For example, you urinate more often at night.  You think that you may be depressed or have anxiety.  You become pregnant or plan to become pregnant. Pregnancy in women with this condition is considered high risk. Get help right away if:  You have chest pain.  You have trouble breathing.  You have a seizure.  You suddenly get a very bad headache.  You suddenly develop facial or body weakness.  You cannot speak.  You cannot understand speech. These symptoms may represent a serious problem that is an emergency. Do not wait to see if the symptoms will go away. Get medical help right away. Call your local emergency services (911 in the [place] Do not drive yourself to the hospital. Summary  Systemic lupus erythematosus (SLE) is a long-term disease that can affect many parts of the body.  SLE is an autoimmune disease. That means your body's defense system (immune system) mistakenly attacks healthy tissues.  There is no cure for this condition, but treatment can help to control symptoms, prevent flares, and prevent damage to your organs. Treatment may involve taking a  combination of medicines over time. This information is not intended to replace advice given to you by your health care provider. Make sure you discuss any questions you have with your health care provider. Document Released: 09/09/2002 Document Revised: 10/27/2017 Document Reviewed: 10/27/2017 Elsevier Interactive Patient Education  2019 [employer].

## 2018-12-10 NOTE — Progress Notes (Signed)
[facility]   Progress Note: General Provider: Mike Gip, FNP  SUBJECTIVE:   Wendy Herman is a 33 y.o. female who  has a past medical history of Arthritis, Chronic pain, Fibromyalgia, Lupus (HCC), and Neutropenia (HCC).. Patient presents today for Lupus (wants a rx for lupus not methotrexate, prednisone and would like pain med) Patient states that she would like to have her physical and mental health medications adjusted and does not want to re-start methotrexate. Patient is requesting pain medication for her lupus and would like to be seen by rheumatology. Currently is uninsured and states that she has a Personal assistant pending. Patient did not go to [facility] as previously instructed. She states that she feels better now that she slept.   Review of Systems  Constitutional: Negative.   HENT: Negative.   Eyes: Negative.   Respiratory: Negative.   Cardiovascular: Negative.   Gastrointestinal: Negative.   Genitourinary: Negative.   Musculoskeletal: Positive for joint pain and myalgias.  Skin: Positive for rash.  Neurological: Negative.   Psychiatric/Behavioral: Negative.      OBJECTIVE: BP (!) 144/97 (BP Location: Right Arm, Patient Position: Sitting, Cuff Size: Normal)   Pulse 100   Temp 98.5 F (36.9 C) (Oral)   Wt 148 lb (67.1 kg)   LMP 11/12/2018   SpO2 100%   BMI 26.22 kg/m   Wt Readings from Last 3 Encounters:  12/10/18 148 lb (67.1 kg)  12/07/18 148 lb (67.1 kg)  11/29/18 150 lb (68 kg)     Physical Exam Vitals signs and nursing note reviewed.  Constitutional:      General: She is not in acute distress.    Appearance: She is well-developed.  HENT:     Head: Normocephalic and atraumatic.  Eyes:     Conjunctiva/sclera: Conjunctivae normal.     Pupils: Pupils are equal, round, and reactive to light.  Neck:     Musculoskeletal: Normal range of motion.  Cardiovascular:     Rate and Rhythm: Normal rate  and regular rhythm.     Heart sounds: Normal heart sounds.  Pulmonary:     Effort: Pulmonary effort is normal. No respiratory distress.     Breath sounds: Normal breath sounds.  Abdominal:     General: Bowel sounds are normal. There is no distension.     Palpations: Abdomen is soft.  Musculoskeletal: Normal range of motion.  Skin:    General: Skin is warm and dry.     Comments: Patient with significant alopecia and excoriation noted to her scalp. There is mild crusting and peeling of the skin. TTP.   Neurological:     Mental Status: She is alert and oriented to person, place, and time.  Psychiatric:        Behavior: Behavior normal.        Thought Content: Thought content normal.         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  Dispense: 30 g; Refill: 0 - traMADol (ULTRAM) 50 MG tablet; Take 1 tablet (50 mg total) by mouth every 8 (eight) hours as needed for up to 10 days for moderate pain.  Dispense: 30 tablet; Refill: 0  2. Systemic lupus erythematosus, unspecified SLE type, unspecified organ involvement status (HCC) Patient is currently uninsured and is requesting referral to rheumatology. Explained to patient that most specialists do not accept patients without insurance or  [facility] discount. She has not applied for the discount. Has been encouraged to apply. Once she has insurance, will refer her to specialist. Records request pending.   3. Bipolar affective disorder, current episode hypomanic (HCC) Referred to [facility] for further evaluation of medications. Explained that they have walk-in appointments and take the uninsured.   4. Elevated blood pressure reading Patient's bp remains elevated during the visit. She was advised to keep track of BP readings by going to pharmacy to check. If they continue to be elevated, will start on anti-hypertensive medication.   Time Spent: 25  minutes face-to-face with this patient discussing problems, treatments, and answering patient's questions.    Return in about 1 week (around 12/17/2018) for multiple reasons- birth control .    The patient was given clear instructions to go to ER or return to medical center if symptoms do not improve, worsen or new problems develop. The patient verbalized understanding and agreed with plan of care.   Ms. Wendy Herman. Riley Lam, FNP-BC [facility] [address] [place], [place] [address] [phone]

## 2018-12-11 LAB — COMPREHENSIVE METABOLIC PANEL
ALT: 17 IU/L (ref 0–32)
AST: 16 IU/L (ref 0–40)
Albumin/Globulin Ratio: 1.9 (ref 1.2–2.2)
Albumin: 5 g/dL — ABNORMAL HIGH (ref 3.8–4.8)
Alkaline Phosphatase: 65 IU/L (ref 39–117)
BUN/Creatinine Ratio: 23 (ref 9–23)
BUN: 15 mg/dL (ref 6–20)
Bilirubin Total: <0.2 mg/dL (ref 0.0–1.2)
CO2: 23 mmol/L (ref 20–29)
Calcium: 10.1 mg/dL (ref 8.7–10.2)
Chloride: 99 mmol/L (ref 96–106)
Creatinine, Ser: 0.64 mg/dL (ref 0.57–1.00)
GFR calc Af Amer: 137 mL/min/{1.73_m2} (ref >59)
GFR calc non Af Amer: 118 mL/min/{1.73_m2} (ref >59)
Globulin, Total: 2.6 g/dL (ref 1.5–4.5)
Glucose: 91 mg/dL (ref 65–99)
Potassium: 4.7 mmol/L (ref 3.5–5.2)
Sodium: 139 mmol/L (ref 134–144)
Total Protein: 7.6 g/dL (ref 6.0–8.5)

## 2018-12-11 LAB — CBC WITH DIFFERENTIAL/PLATELET
Basophils Absolute: 0 10*3/uL (ref 0.0–0.2)
Basos: 0 %
EOS (ABSOLUTE): 0 10*3/uL (ref 0.0–0.4)
Eos: 0 %
Hematocrit: 40.7 % (ref 34.0–46.6)
Hemoglobin: 13.4 g/dL (ref 11.1–15.9)
Immature Grans (Abs): 0 10*3/uL (ref 0.0–0.1)
Immature Granulocytes: 0 %
Lymphocytes Absolute: 1.3 10*3/uL (ref 0.7–3.1)
Lymphs: 40 %
MCH: 29.6 pg (ref 26.6–33.0)
MCHC: 32.9 g/dL (ref 31.5–35.7)
MCV: 90 fL (ref 79–97)
Monocytes Absolute: 0.6 10*3/uL (ref 0.1–0.9)
Monocytes: 17 %
Neutrophils Absolute: 1.4 10*3/uL (ref 1.4–7.0)
Neutrophils: 43 %
Platelets: 323 10*3/uL (ref 150–450)
RBC: 4.53 x10E6/uL (ref 3.77–5.28)
RDW: 14.5 % (ref 11.7–15.4)
WBC: 3.3 10*3/uL — ABNORMAL LOW (ref 3.4–10.8)

## 2018-12-11 LAB — ANA,IFA RA DIAG PNL W/RFLX TIT/PATN
ANA Titer 1: NEGATIVE
Cyclic Citrullin Peptide Ab: 8 units (ref 0–19)
Rhuematoid fact SerPl-aCnc: <10 IU/mL (ref 0.0–13.9)

## 2018-12-11 MED FILL — FLUOCINONIDE 0.05% OINTMENT: 0.05 | 15 days supply | Qty: 30 | Fill #0

## 2018-12-11 MED FILL — traMADol HCL 50 MG TABS: 50 | 10 days supply | Qty: 30 | Fill #0

## 2018-12-14 ENCOUNTER — Ambulatory Visit: Payer: Self-pay | Admitting: Family Medicine

## 2018-12-14 NOTE — Progress Notes (Signed)
Patient called.  Results reviewed.  Patient asked if they needed to be reviewed by rheumatologist and I explained that no further action necessary at this time.

## 2018-12-14 NOTE — Progress Notes (Signed)
Patient called.  Given results message.  Asked if they needed to be sent to rheumatologist for review and I explained they were all normal so no further actions were necessary.

## 2018-12-21 ENCOUNTER — Ambulatory Visit: Payer: Self-pay | Admitting: Family Medicine

## 2019-01-08 ENCOUNTER — Ambulatory Visit: Payer: Self-pay | Admitting: Nurse Practitioner

## 2019-01-08 MED FILL — OLANZapine 20 MG TABS: 20 | 30 days supply | Qty: 30 | Fill #1

## 2019-01-16 MED FILL — HYDROXYCHLOROQUINE SULFATE: 200 | 30 days supply | Qty: 60 | Fill #0

## 2019-02-27 ENCOUNTER — Other Ambulatory Visit: Payer: Self-pay | Admitting: Family Medicine

## 2019-02-27 DIAGNOSIS — M329 Systemic lupus erythematosus, unspecified: Secondary | ICD-10-CM

## 2019-02-27 MED FILL — OLANZapine 20 MG TABS: 20 | 30 days supply | Qty: 30 | Fill #2

## 2019-02-28 MED FILL — ?PREDNISONE 10 MG TABLET: 10 | 30 days supply | Qty: 120 | Fill #0

## 2019-03-05 MED FILL — ?HYDROXYCHLOROQUINE 200 MG: 200 | 15 days supply | Qty: 30 | Fill #0

## 2019-03-05 MED FILL — cloNIDine HCL 0.2 MG TABS: 0.2 | 30 days supply | Qty: 30 | Fill #1

## 2019-03-06 ENCOUNTER — Encounter: Payer: Self-pay | Admitting: Family Medicine

## 2019-03-06 ENCOUNTER — Other Ambulatory Visit: Payer: Self-pay

## 2019-03-06 ENCOUNTER — Ambulatory Visit (INDEPENDENT_AMBULATORY_CARE_PROVIDER_SITE_OTHER): Payer: Self-pay | Admitting: Family Medicine

## 2019-03-06 VITALS — BP 156/102 | HR 96 | Temp 98.7°F | Resp 14 | Ht 64.0 in | Wt 162.0 lb

## 2019-03-06 DIAGNOSIS — R251 Tremor, unspecified: Secondary | ICD-10-CM

## 2019-03-06 DIAGNOSIS — Z30011 Encounter for initial prescription of contraceptive pills: Secondary | ICD-10-CM

## 2019-03-06 DIAGNOSIS — M329 Systemic lupus erythematosus, unspecified: Secondary | ICD-10-CM

## 2019-03-06 DIAGNOSIS — F310 Bipolar disorder, current episode hypomanic: Secondary | ICD-10-CM

## 2019-03-06 DIAGNOSIS — L930 Discoid lupus erythematosus: Secondary | ICD-10-CM

## 2019-03-06 LAB — POCT URINE PREGNANCY: Preg Test, Ur: NEGATIVE

## 2019-03-06 MED ORDER — NORGESTIMATE-ETH ESTRADIOL 0.25-35 MG-MCG PO TABS: 1.0000 | ORAL_TABLET | Freq: Every day | ORAL | 11 refills | Status: DC

## 2019-03-06 MED ORDER — OLANZAPINE 20 MG PO TABS: 20.0000 mg | ORAL_TABLET | Freq: Every day | ORAL | 1 refills | Status: DC

## 2019-03-06 MED ORDER — HYDROXYCHLOROQUINE SULFATE 200 MG PO TABS: 400.0000 mg | ORAL_TABLET | Freq: Every day | ORAL | 1 refills | Status: DC

## 2019-03-06 MED ORDER — TRAMADOL HCL 50 MG PO TABS: 50.0000 mg | ORAL_TABLET | Freq: Three times a day (TID) | ORAL | 0 refills | Status: AC | PRN

## 2019-03-06 MED ORDER — POLYETHYLENE GLYCOL 3350 17 GM/SCOOP PO POWD: 17.0000 g | Freq: Two times a day (BID) | ORAL | 1 refills | Status: DC | PRN

## 2019-03-06 MED ORDER — LAMOTRIGINE 200 MG PO TABS: 200.0000 mg | ORAL_TABLET | Freq: Every day | ORAL | 2 refills | Status: DC

## 2019-03-06 MED ORDER — OMEPRAZOLE 20 MG PO CPDR: 40.0000 mg | DELAYED_RELEASE_CAPSULE | Freq: Two times a day (BID) | ORAL | 0 refills | Status: DC | PRN

## 2019-03-06 MED ORDER — GUANFACINE HCL 1 MG PO TABS: 1.0000 mg | ORAL_TABLET | Freq: Every day | ORAL | 2 refills | Status: DC

## 2019-03-06 MED ORDER — PREDNISONE 10 MG PO TABS: 20.0000 mg | ORAL_TABLET | Freq: Two times a day (BID) | ORAL | 0 refills | Status: AC

## 2019-03-06 MED ORDER — CLOBETASOL PROPIONATE 0.05 % EX CREA: 1.0000 "application " | TOPICAL_CREAM | Freq: Two times a day (BID) | CUTANEOUS | 2 refills | Status: DC

## 2019-03-06 MED ORDER — LISINOPRIL 10 MG PO TABS: 10.0000 mg | ORAL_TABLET | Freq: Every day | ORAL | 3 refills | Status: DC

## 2019-03-06 MED ORDER — PREDNISONE 10 MG PO TABS: 20.0000 mg | ORAL_TABLET | Freq: Two times a day (BID) | ORAL | 0 refills | Status: DC

## 2019-03-06 MED FILL — MONO-LINYAH 28 TABLET: 0.25-35 | 28 days supply | Qty: 28 | Fill #0

## 2019-03-06 MED FILL — CLOBETASOL PROPIONATE 0.05: 0.05 | 30 days supply | Qty: 60 | Fill #0

## 2019-03-06 MED FILL — POLYETHYLENE GLYCOL 3350 PO: 17 | 15 days supply | Qty: 510 | Fill #0

## 2019-03-06 MED FILL — traMADol HCL 50 MG TABS: 50 | 10 days supply | Qty: 30 | Fill #0

## 2019-03-06 MED FILL — ?OMEPRAZOLE 20 MG CAPSULE D: 20 | 7 days supply | Qty: 30 | Fill #0

## 2019-03-06 MED FILL — guanFACINE HCL 1 MG TABS: 1 | 30 days supply | Qty: 30 | Fill #0

## 2019-03-06 MED FILL — lamoTRIgine 200 MG TABS: 200 | 30 days supply | Qty: 30 | Fill #0

## 2019-03-06 MED FILL — LISINOPRIL 10 MG TABS: 10 | 30 days supply | Qty: 30 | Fill #0

## 2019-03-06 NOTE — Progress Notes (Signed)
[facility]   Progress Note: General Provider: Mike GipAndre Luba Matzen, FNP  SUBJECTIVE:   Wendy HiltsRobin C Milhouse is a 33 y.o. female who  has a past medical history of Arthritis, Chronic pain, Fibromyalgia, Lupus (HCC), and Neutropenia (HCC).. Patient presents today for Medication Refill (wants clobetasol cream); Follow-up (lupus/bipolar. more rashes ); and Insomnia  Patient states that she has not been able to get an appointment with [facility] and brought in her past prescription bottles from Dr. Sherryll BurgerShah. She states that she has been taking 40mg  of Zyprexa to help with sleep although she has been prescribed 20 mg of Zyprexa. She states that she is having more periods of depression and mania. She denies psychosis, delusional thinking, suicidal/homicidal ideations, intent or plan. She would like to have her medications adjusted.   She also states that she has stopped the dapsone, methotrexate, and wellcovorin. She states that she would like to taper off of the prednisone. Would like a referral to pain management for chronic pain due to fibromyalgia.    Review of Systems  Constitutional: Negative.   HENT: Negative.   Eyes: Negative.   Respiratory: Negative.   Cardiovascular: Negative.   Gastrointestinal: Negative.   Genitourinary: Negative.   Musculoskeletal: Positive for myalgias (chronic).  Skin: Negative.   Neurological: Negative.   Psychiatric/Behavioral: Positive for depression. Negative for suicidal ideas. The patient has insomnia. The patient is not nervous/anxious.      OBJECTIVE: BP (!) 156/102 (BP Location: Right Arm, Patient Position: Sitting, Cuff Size: Large)   Pulse 96   Temp 98.7 F (37.1 C) (Oral)   Resp 14   Ht 5\' 4"  (1.626 m)   Wt 162 lb (73.5 kg)   LMP 02/17/2019   SpO2 100%   BMI 27.81 kg/m   Wt Readings from Last 3 Encounters:  03/06/19 162 lb (73.5 kg)  12/10/18 148 lb (67.1 kg)  12/07/18 148 lb (67.1 kg)     Physical Exam  Constitutional:      General: She is not in acute distress.    Appearance: Normal appearance.  HENT:     Head: Normocephalic and atraumatic.  Neurological:     Mental Status: She is alert.     Motor: Tremor (bilateral upper extremities) present.  Psychiatric:        Attention and Perception: Attention and perception normal.        Mood and Affect: Mood and affect normal.        Speech: Speech normal.        Behavior: Behavior normal. Behavior is cooperative.        Thought Content: Thought content normal.        Cognition and Memory: Cognition normal.        Judgment: Judgment normal.     Comments: Patient is calmer this visit.      ASSESSMENT/PLAN:   1. Systemic lupus erythematosus, unspecified SLE type, unspecified organ involvement status (HCC) Referred to rheumatology. Refilled medications.  - clobetasol cream (TEMOVATE) 0.05 %; Apply 1 application topically 2 (two) times daily.  Dispense: 60 g; Refill: 2 - Ambulatory referral to Rheumatology - Comprehensive metabolic panel - Ambulatory referral to Pain Clinic - hydroxychloroquine (PLAQUENIL) 200 MG tablet; Take 2 tablets (400 mg total) by mouth daily.  Dispense: 30 tablet; Refill: 1 - predniSONE (DELTASONE) 10 MG tablet; Take 2 tablets (20 mg total) by mouth 2 (two) times daily with a meal for 30 days.  Dispense: 120 tablet; Refill: 0  2. Bipolar affective disorder, current episode hypomanic (HCC) D/C clonidine. Start tenex per patient's request. Instructed patient to only take 20mg  Zyprexa as that is the max dose.  - guanFACINE (TENEX) 1 MG tablet; Take 1 tablet (1 mg total) by mouth at bedtime.  Dispense: 30 tablet; Refill: 2 - OLANZapine (ZYPREXA) 20 MG tablet; Take 1 tablet (20 mg total) by mouth at bedtime.  Dispense: 90 tablet; Refill: 1 - lamoTRIgine (LAMICTAL) 200 MG tablet; Take 1 tablet (200 mg total) by mouth daily.  Dispense: 30 tablet; Refill: 2 - Ambulatory referral to Psychiatry  3. Discoid lupus  erythematosus - traMADol (ULTRAM) 50 MG tablet; Take 1 tablet (50 mg total) by mouth every 8 (eight) hours as needed for up to 10 days for moderate pain.  Dispense: 30 tablet; Refill: 0  4. Encounter for initial prescription of contraceptive pills - POCT urine pregnancy  5. Tremor of both hands Possible tardive dyskinesia due increased dose of zyprexa.   Return in about 3 months (around 06/06/2019) for HTN.    The patient was given clear instructions to go to ER or return to medical center if symptoms do not improve, worsen or new problems develop. The patient verbalized understanding and agreed with plan of care.   [name]. Riley Lam, FNP-BC [facility] [address] [place], [place] [address] [phone]

## 2019-03-07 LAB — COMPREHENSIVE METABOLIC PANEL
ALT: 25 IU/L (ref 0–32)
AST: 21 IU/L (ref 0–40)
Albumin/Globulin Ratio: 1.9 (ref 1.2–2.2)
Albumin: 4.8 g/dL (ref 3.8–4.8)
Alkaline Phosphatase: 65 IU/L (ref 39–117)
BUN/Creatinine Ratio: 13 (ref 9–23)
BUN: 11 mg/dL (ref 6–20)
Bilirubin Total: 0.3 mg/dL (ref 0.0–1.2)
CO2: 26 mmol/L (ref 20–29)
Calcium: 9.5 mg/dL (ref 8.7–10.2)
Chloride: 100 mmol/L (ref 96–106)
Creatinine, Ser: 0.82 mg/dL (ref 0.57–1.00)
GFR calc Af Amer: 109 mL/min/{1.73_m2} (ref >59)
GFR calc non Af Amer: 95 mL/min/{1.73_m2} (ref >59)
Globulin, Total: 2.5 g/dL (ref 1.5–4.5)
Glucose: 62 mg/dL — ABNORMAL LOW (ref 65–99)
Potassium: 4.1 mmol/L (ref 3.5–5.2)
Sodium: 140 mmol/L (ref 134–144)
Total Protein: 7.3 g/dL (ref 6.0–8.5)

## 2019-03-07 NOTE — Progress Notes (Signed)
The  labs are stable without significant clinical change.  All other results are normal or within acceptable limits. No Medication changes  Please send lab letter to patient.  

## 2019-03-11 ENCOUNTER — Other Ambulatory Visit: Payer: Self-pay

## 2019-03-11 DIAGNOSIS — M329 Systemic lupus erythematosus, unspecified: Secondary | ICD-10-CM

## 2019-03-11 MED ORDER — HYDROXYCHLOROQUINE SULFATE 200 MG PO TABS: 400.0000 mg | ORAL_TABLET | Freq: Every day | ORAL | 1 refills | Status: DC

## 2019-03-11 MED FILL — ?HYDROXYCHLOROQUINE 200 MG: 200 | 30 days supply | Qty: 60 | Fill #0

## 2019-04-04 ENCOUNTER — Encounter (HOSPITAL_COMMUNITY): Payer: Self-pay

## 2019-04-04 ENCOUNTER — Emergency Department (HOSPITAL_COMMUNITY)
Admission: EM | Admit: 2019-04-04 | Discharge: 2019-04-04 | Disposition: A | Discharge status: Home / Self Care | Payer: Self-pay | Attending: Emergency Medicine | Admitting: Emergency Medicine

## 2019-04-04 ENCOUNTER — Other Ambulatory Visit: Payer: Self-pay

## 2019-04-04 DIAGNOSIS — B029 Zoster without complications: Secondary | ICD-10-CM | POA: Insufficient documentation

## 2019-04-04 DIAGNOSIS — Z87891 Personal history of nicotine dependence: Secondary | ICD-10-CM | POA: Insufficient documentation

## 2019-04-04 DIAGNOSIS — F310 Bipolar disorder, current episode hypomanic: Secondary | ICD-10-CM

## 2019-04-04 DIAGNOSIS — M3210 Systemic lupus erythematosus, organ or system involvement unspecified: Secondary | ICD-10-CM | POA: Insufficient documentation

## 2019-04-04 DIAGNOSIS — Z79899 Other long term (current) drug therapy: Secondary | ICD-10-CM | POA: Insufficient documentation

## 2019-04-04 MED ORDER — OLANZAPINE 20 MG PO TABS: 20.0000 mg | ORAL_TABLET | Freq: Every day | ORAL | 0 refills | Status: DC

## 2019-04-04 MED ORDER — OXYCODONE-ACETAMINOPHEN 5-325 MG PO TABS
1.0000 | ORAL_TABLET | Freq: Once | ORAL | Status: AC
  Administered 2019-04-04: 1 via ORAL
  Filled 2019-04-04: qty 1

## 2019-04-04 MED ORDER — OXYCODONE-ACETAMINOPHEN 5-325 MG PO TABS: 1.0000 | ORAL_TABLET | ORAL | 0 refills | Status: DC | PRN

## 2019-04-04 MED ORDER — VALACYCLOVIR HCL 500 MG PO TABS
1000.0000 mg | ORAL_TABLET | Freq: Once | ORAL | Status: AC
  Administered 2019-04-04: 1000 mg via ORAL
  Filled 2019-04-04: qty 2

## 2019-04-04 MED ORDER — VALACYCLOVIR HCL 1 G PO TABS: 1000.0000 mg | ORAL_TABLET | Freq: Three times a day (TID) | ORAL | 0 refills | Status: AC

## 2019-04-04 NOTE — Discharge Instructions (Addendum)
Follow-up with your primary care doctor for ongoing care and treatment.

## 2019-04-04 NOTE — ED Provider Notes (Signed)
[facility] EMERGENCY DEPARTMENT Provider Note   CSN: [id number] Arrival date & time: 04/04/19  2019    History   Chief Complaint Chief Complaint  Patient presents with  . Herpes Zoster    HPI Wendy Herman is a 33 y.o. female.     HPI  She complains of a rash on her right side, for 1 day, preceded by pain in the same area for about a week.  She feels like she has another episode of shingles.  She complains of pain at the site of the rash.  She denies fever, chills, cough, weakness or dizziness.  There are no other known modifying factors.  She requests a refill on Zyprexa because she does not have a doctor to prescribe it.  Past Medical History:  Diagnosis Date  . Arthritis   . Chronic pain   . Fibromyalgia   . Lupus ([place])   . Neutropenia [facility])     Patient Active Problem List   Diagnosis Date Noted  . 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 Bipolar affective disorder, current episode hypomanic ([place]) 12/10/2018  . Chest pain 08/27/2018  . Neutropenia ([place]) 08/27/2018  . Anemia 08/27/2018  . SLE (systemic lupus erythematosus) ([place]) 08/27/2018    Past Surgical History:  Procedure Laterality Date  . CESAREAN SECTION       OB History   No obstetric history on file.      Home Medications    Prior to Admission medications   Medication Sig Start Date End Date Taking? Authorizing Provider  Bacillus Coagulans-Inulin (PROBIOTIC FORMULA PO) Take 1-2 capsules by mouth daily.   Yes [provider]  Calcium Carb-Cholecalciferol (CALCIUM 600 + D PO) Take 1 tablet by mouth daily.   Yes [provider]  clobetasol cream (TEMOVATE) 9.37 % Apply 1 application topically 2 (two) times daily. 03/06/19  Yes Lanae Boast, FNP  diphenhydrAMINE (BENADRYL) 25 mg capsule Take 200 mg by mouth at bedtime as needed for sleep.   Yes [provider]  guanFACINE (TENEX) 1 MG tablet Take 1 tablet (1 mg total) by mouth at bedtime. 03/06/19  Yes Lanae Boast,  FNP  hydroxychloroquine (PLAQUENIL) 200 MG tablet Take 2 tablets (400 mg total) by mouth daily. 03/11/19  Yes Lanae Boast, FNP  lamoTRIgine (LAMICTAL) 200 MG tablet Take 1 tablet (200 mg total) by mouth daily. 03/06/19  Yes Lanae Boast, FNP  omeprazole (PRILOSEC) 20 MG capsule Take 2 capsules (40 mg total) by mouth 2 (two) times daily as needed (for acid reflux). Acid Reflux 03/06/19  Yes Lanae Boast, FNP  polyethylene glycol powder (GLYCOLAX/MIRALAX) 17 GM/SCOOP powder Take 17 g by mouth 2 (two) times daily as needed. 03/06/19  Yes Lanae Boast, FNP  predniSONE (DELTASONE) 10 MG tablet Take 2 tablets (20 mg total) by mouth 2 (two) times daily with a meal for 30 days. 03/06/19 04/05/19 Yes Lanae Boast, FNP  norgestimate-ethinyl estradiol ([place] 28) 0.25-35 MG-MCG tablet Take 1 tablet by mouth daily. Patient not taking: Reported on 04/04/2019 03/06/19   Lanae Boast, FNP  OLANZapine (ZYPREXA) 20 MG tablet Take 1 tablet (20 mg total) by mouth at bedtime. 04/04/19   Daleen Bo, MD  oxyCODONE-acetaminophen (PERCOCET) 5-325 MG tablet Take 1 tablet by mouth every 4 (four) hours as needed for moderate pain or severe pain. 04/04/19   Daleen Bo, MD  valACYclovir (VALTREX) 1000 MG tablet Take 1 tablet (1,000 mg total) by mouth 3 (three) times daily for 14 days. 04/04/19 04/18/19  Daleen Bo,  MD    Family History Family History  Problem Relation Age of Onset  . Hypertension Mother   . Heart attack Father        In his 2950s. Died from it.  . Rheum arthritis Maternal Grandmother   . Lupus Maternal Grandmother   . Diabetes Maternal Grandmother   . Heart disease Maternal Grandfather   . Diabetes Maternal Grandfather   . Breast cancer Maternal Aunt   . Pancreatic cancer Maternal Uncle   . Diabetes Maternal Aunt   . Diabetes Maternal Aunt     Social History Social History   Tobacco Use  . Smoking status: Former Games developermoker  . Smokeless tobacco: Never Used  Substance Use Topics  . Alcohol use:  No  . Drug use: No     Allergies   Codeine, Nsaids, and Sulfa antibiotics   Review of Systems Review of Systems  All other systems reviewed and are negative.    Physical Exam Updated Vital Signs BP (!) 171/125 (BP Location: Right Arm)   Pulse (!) 106   Temp 98.3 F (36.8 C) (Oral)   Ht 5\' 4"  (1.626 m)   Wt 73.9 kg   SpO2 98%   BMI 27.98 kg/m   Physical Exam Vitals signs and nursing note reviewed.  Constitutional:      General: She is in acute distress (Uncomfortable).     Appearance: She is well-developed. She is not ill-appearing, toxic-appearing or diaphoretic.  HENT:     Head: Normocephalic and atraumatic.  Eyes:     Conjunctiva/sclera: Conjunctivae normal.     Pupils: Pupils are equal, round, and reactive to light.  Neck:     Musculoskeletal: Normal range of motion and neck supple.     Trachea: Phonation normal.  Cardiovascular:     Rate and Rhythm: Normal rate.  Pulmonary:     Effort: Pulmonary effort is normal.  Musculoskeletal: Normal range of motion.  Skin:    General: Skin is warm and dry.     Comments: Vesicular rash, right T5 dermatome, few scattered areas of the lateral thorax region.  No associated bleeding, fluctuance or drainage.  Neurological:     Mental Status: She is alert and oriented to person, place, and time.     Motor: No abnormal muscle tone.  Psychiatric:        Mood and Affect: Mood normal.        Behavior: Behavior normal.        Thought Content: Thought content normal.        Judgment: Judgment normal.      ED Treatments / Results  Labs (all labs ordered are listed, but only abnormal results are displayed) Labs Reviewed - No data to display  EKG None  Radiology No results found.  Procedures Procedures (including critical care time)  Medications Ordered in ED Medications  valACYclovir (VALTREX) tablet 1,000 mg (1,000 mg Oral Given 04/04/19 2123)  oxyCODONE-acetaminophen (PERCOCET/ROXICET) 5-325 MG per tablet 1  tablet (1 tablet Oral Given 04/04/19 2123)     Initial Impression / Assessment and Plan / ED Course  I have reviewed the triage vital signs and the nursing notes.  Pertinent labs & imaging results that were available during my care of the patient were reviewed by me and considered in my medical decision making (see chart for details).         Patient Vitals for the past 24 hrs:  BP Temp Temp src Pulse SpO2 Height Weight  04/04/19 2029 - - - - -  5\' 4"  (1.626 m) 73.9 kg  04/04/19 2028 (!) 171/125 98.3 F (36.8 C) Oral (!) 106 98 % - -    9:26 PM Reevaluation with update and discussion. After initial assessment and treatment, an updated evaluation reveals she is comfortable without further complaints. Mancel BaleElliott Thimothy Barretta   Medical Decision Making: Herpes zoster right thorax, apparently recurrent.  No overt complications.  Patient also states she is out of her Zyprexa nighttime dose.  Doubt severe immunocompromise. doubt serious bacterial infection, metabolic instability or impending vascular collapse.  No overt psychiatric disorder at this time.  CRITICAL CARE-no Performed by: Mancel BaleElliott Purvis Sidle  Nursing Notes Reviewed/ Care Coordinated Applicable Imaging Reviewed Interpretation of Laboratory Data incorporated into ED treatment  The patient appears reasonably screened and/or stabilized for discharge and I doubt any other medical condition or other [facility] requiring further screening, evaluation, or treatment in the ED at this time prior to discharge.  Plan: Home Medications-continue usual; Home Treatments-wound care; return here if the recommended treatment, does not improve the symptoms; Recommended follow up-PCP checkup 1 to 2 weeks and as needed   Final Clinical Impressions(s) / ED Diagnoses   Final diagnoses:  Herpes zoster without complication    ED Discharge Orders         Ordered    OLANZapine (ZYPREXA) 20 MG tablet  Daily at bedtime     04/04/19 2125    valACYclovir (VALTREX) 1000  MG tablet  3 times daily     04/04/19 2125    oxyCODONE-acetaminophen (PERCOCET) 5-325 MG tablet  Every 4 hours PRN     04/04/19 2125           Mancel BaleWentz, Elisabet Gutzmer, MD 04/04/19 2128

## 2019-04-04 NOTE — ED Triage Notes (Signed)
Pt has had shingles once already before. Noticed the rash yesterday. Rash goes along right breast to theback. Has been in pain for a few weeks now

## 2019-04-10 ENCOUNTER — Emergency Department (HOSPITAL_COMMUNITY)
Admission: EM | Admit: 2019-04-10 | Discharge: 2019-04-10 | Disposition: A | Discharge status: Home / Self Care | Payer: Medicaid Other | Attending: Emergency Medicine | Admitting: Emergency Medicine

## 2019-04-10 ENCOUNTER — Other Ambulatory Visit: Payer: Self-pay

## 2019-04-10 ENCOUNTER — Encounter (HOSPITAL_COMMUNITY): Payer: Self-pay | Admitting: Emergency Medicine

## 2019-04-10 DIAGNOSIS — Z87891 Personal history of nicotine dependence: Secondary | ICD-10-CM | POA: Insufficient documentation

## 2019-04-10 DIAGNOSIS — B0229 Other postherpetic nervous system involvement: Secondary | ICD-10-CM | POA: Insufficient documentation

## 2019-04-10 DIAGNOSIS — Z79899 Other long term (current) drug therapy: Secondary | ICD-10-CM | POA: Insufficient documentation

## 2019-04-10 MED ORDER — CAPSAICIN 0.075 % EX CREA: 1.0000 "application " | TOPICAL_CREAM | Freq: Two times a day (BID) | CUTANEOUS | 0 refills | Status: DC

## 2019-04-10 MED ORDER — GABAPENTIN 100 MG PO CAPS: 100.0000 mg | ORAL_CAPSULE | Freq: Three times a day (TID) | ORAL | 0 refills | Status: DC

## 2019-04-10 MED ORDER — OXYCODONE-ACETAMINOPHEN 5-325 MG PO TABS
1.0000 | ORAL_TABLET | Freq: Once | ORAL | Status: AC
  Administered 2019-04-10: 1 via ORAL
  Filled 2019-04-10: qty 1

## 2019-04-10 NOTE — ED Triage Notes (Signed)
Pt reports rash to right axilla/back area that about 2 weeks ago, was seen here on 04/04/19 and prescribed percocet and acyclovir-pt has been taken twice daily and out of percocet. Pt continues to have pain. 2 small red bumps visible in triage with no drainage or break in skin integrity. Pt says there were blisters but they are gone now.

## 2019-04-10 NOTE — ED Provider Notes (Signed)
[facility] EMERGENCY DEPARTMENT Provider Note   CSN: [id number] Arrival date & time: 04/10/19  2225    History   Chief Complaint Chief Complaint  Patient presents with  . Rash    recent dx of shingles    HPI Wendy Herman is a 33 y.o. female.     Patient presents to the emergency department for evaluation of painful rash.  Patient was seen previously in the ER with a rash on her right back and chest wall, treated for shingles.  She reports taking acyclovir and Percocet, I rash has improved.  She is, however, still experiencing a burning pain across that area.     Past Medical History:  Diagnosis Date  . Arthritis   . Chronic pain   . Fibromyalgia   . Lupus ([place])   . Neutropenia [facility])     Patient Active Problem List   Diagnosis Date Noted  . 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 Bipolar affective disorder, current episode hypomanic ([place]) 12/10/2018  . Chest pain 08/27/2018  . Neutropenia ([place]) 08/27/2018  . Anemia 08/27/2018  . SLE (systemic lupus erythematosus) ([place]) 08/27/2018    Past Surgical History:  Procedure Laterality Date  . CESAREAN SECTION       OB History   No obstetric history on file.      Home Medications    Prior to Admission medications   Medication Sig Start Date End Date Taking? Authorizing Provider  Bacillus Coagulans-Inulin (PROBIOTIC FORMULA PO) Take 1-2 capsules by mouth daily.    [provider]  Calcium Carb-Cholecalciferol (CALCIUM 600 + D PO) Take 1 tablet by mouth daily.    [provider]  capsicum (ZOSTRIX) 0.075 % topical cream Apply 1 application topically 2 (two) times daily. 04/10/19   Orpah Greek, MD  clobetasol cream (TEMOVATE) 5.36 % Apply 1 application topically 2 (two) times daily. 03/06/19   Lanae Boast, FNP  diphenhydrAMINE (BENADRYL) 25 mg capsule Take 200 mg by mouth at bedtime as needed for sleep.    [provider]  gabapentin (NEURONTIN) 100 MG capsule Take 1  capsule (100 mg total) by mouth 3 (three) times daily. 04/10/19   Orpah Greek, MD  guanFACINE (TENEX) 1 MG tablet Take 1 tablet (1 mg total) by mouth at bedtime. 03/06/19   Lanae Boast, FNP  hydroxychloroquine (PLAQUENIL) 200 MG tablet Take 2 tablets (400 mg total) by mouth daily. 03/11/19   Lanae Boast, FNP  lamoTRIgine (LAMICTAL) 200 MG tablet Take 1 tablet (200 mg total) by mouth daily. 03/06/19   Lanae Boast, FNP  norgestimate-ethinyl estradiol ([place] 28) 0.25-35 MG-MCG tablet Take 1 tablet by mouth daily. Patient not taking: Reported on 04/04/2019 03/06/19   Lanae Boast, FNP  OLANZapine (ZYPREXA) 20 MG tablet Take 1 tablet (20 mg total) by mouth at bedtime. 04/04/19   Daleen Bo, MD  omeprazole (PRILOSEC) 20 MG capsule Take 2 capsules (40 mg total) by mouth 2 (two) times daily as needed (for acid reflux). Acid Reflux 03/06/19   Lanae Boast, FNP  oxyCODONE-acetaminophen (PERCOCET) 5-325 MG tablet Take 1 tablet by mouth every 4 (four) hours as needed for moderate pain or severe pain. 04/04/19   Daleen Bo, MD  polyethylene glycol powder (GLYCOLAX/MIRALAX) 17 GM/SCOOP powder Take 17 g by mouth 2 (two) times daily as needed. 03/06/19   Lanae Boast, FNP  valACYclovir (VALTREX) 1000 MG tablet Take 1 tablet (1,000 mg total) by mouth 3 (three) times daily for 14 days. 04/04/19 04/18/19  Eulis Foster,  Mechele CollinElliott, MD    Family History Family History  Problem Relation Age of Onset  . Hypertension Mother   . Heart attack Father        In his 5250s. Died from it.  . Rheum arthritis Maternal Grandmother   . Lupus Maternal Grandmother   . Diabetes Maternal Grandmother   . Heart disease Maternal Grandfather   . Diabetes Maternal Grandfather   . Breast cancer Maternal Aunt   . Pancreatic cancer Maternal Uncle   . Diabetes Maternal Aunt   . Diabetes Maternal Aunt     Social History Social History   Tobacco Use  . Smoking status: Former Games developermoker  . Smokeless tobacco: Never Used  Substance  Use Topics  . Alcohol use: No  . Drug use: No     Allergies   Codeine, Nsaids, and Sulfa antibiotics   Review of Systems Review of Systems  Skin: Positive for rash.  All other systems reviewed and are negative.    Physical Exam Updated Vital Signs BP 137/86 (BP Location: Left Arm)   Pulse (!) 110   Temp 98.5 F (36.9 C) (Oral)   Resp 17   Ht 5\' 4"  (1.626 m)   Wt 73.9 kg   LMP 03/15/2019   SpO2 100%   BMI 27.98 kg/m   Physical Exam Vitals signs and nursing note reviewed.  Constitutional:      General: She is not in acute distress.    Appearance: Normal appearance. She is well-developed.  HENT:     Head: Normocephalic and atraumatic.     Right Ear: Hearing normal.     Left Ear: Hearing normal.     Nose: Nose normal.  Eyes:     Conjunctiva/sclera: Conjunctivae normal.     Pupils: Pupils are equal, round, and reactive to light.  Neck:     Musculoskeletal: Normal range of motion and neck supple.  Cardiovascular:     Rate and Rhythm: Regular rhythm.     Heart sounds: S1 normal and S2 normal. No murmur. No friction rub. No gallop.   Pulmonary:     Effort: Pulmonary effort is normal. No respiratory distress.     Breath sounds: Normal breath sounds.  Chest:     Chest wall: No tenderness.  Abdominal:     General: Bowel sounds are normal.     Palpations: Abdomen is soft.     Tenderness: There is no abdominal tenderness. There is no guarding or rebound. Negative signs include Murphy's sign and McBurney's sign.     Hernia: No hernia is present.  Musculoskeletal: Normal range of motion.  Skin:    General: Skin is warm and dry.     Findings: No rash.     Comments: Faint erythematous, healing rash right lateral chest wall, no vesicles, no induration or fluctuance  Neurological:     Mental Status: She is alert and oriented to person, place, and time.     GCS: GCS eye subscore is 4. GCS verbal subscore is 5. GCS motor subscore is 6.     Cranial Nerves: No cranial  nerve deficit.     Sensory: No sensory deficit.     Coordination: Coordination normal.  Psychiatric:        Speech: Speech normal.        Behavior: Behavior normal.        Thought Content: Thought content normal.      ED Treatments / Results  Labs (all labs ordered are listed, but only abnormal  results are displayed) Labs Reviewed - No data to display  EKG None  Radiology No results found.  Procedures Procedures (including critical care time)  Medications Ordered in ED Medications  oxyCODONE-acetaminophen (PERCOCET/ROXICET) 5-325 MG per tablet 1 tablet (has no administration in time range)     Initial Impression / Assessment and Plan / ED Course  I have reviewed the triage vital signs and the nursing notes.  Pertinent labs & imaging results that were available during my care of the patient were reviewed by me and considered in my medical decision making (see chart for details).        Presents with persistent pain after previous diagnosis of shingles.  She does have a very faint healing rash along the lateral chest wall consistent with previously described rash.  No vesicles or active drainage.  She admits that she did not take all of the acyclovir because her mother, who is a Engineer, civil (consulting)nurse, told her that it "seemed like a lot".  No active outbreak at this time.  Do not feel that the patient should still be requiring narcotic analgesia, will switch to Neurontin and capsaicin, follow-up with neurology.  Final Clinical Impressions(s) / ED Diagnoses   Final diagnoses:  Post herpetic neuralgia    ED Discharge Orders         Ordered    capsicum (ZOSTRIX) 0.075 % topical cream  2 times daily     04/10/19 2328    gabapentin (NEURONTIN) 100 MG capsule  3 times daily     04/10/19 2328           Gilda CreasePollina, Christopher J, MD 04/10/19 2329

## 2019-05-20 ENCOUNTER — Other Ambulatory Visit: Payer: Self-pay | Admitting: Family Medicine

## 2019-05-20 MED FILL — lamoTRIgine 200 MG TABS: 200 | 30 days supply | Qty: 30 | Fill #1

## 2019-05-20 MED FILL — ?PREDNISONE 10 MG TABLET: 10 | 30 days supply | Qty: 120 | Fill #0

## 2019-05-20 MED FILL — cloNIDine HCL 0.2 MG TABS: 0.2 | 30 days supply | Qty: 30 | Fill #2

## 2019-05-20 MED FILL — guanFACINE HCL 1 MG TABS: 1 | 30 days supply | Qty: 30 | Fill #1

## 2019-05-20 MED FILL — OLANZapine 20 MG TABS: 20 | 30 days supply | Qty: 30 | Fill #0

## 2019-05-20 MED FILL — MONO-LINYAH 28 TABLET: 0.25-35 | 28 days supply | Qty: 28 | Fill #1

## 2019-05-20 MED FILL — ?HYDROXYCHLOROQUINE 200 MG: 200 | 30 days supply | Qty: 60 | Fill #1

## 2019-05-21 MED FILL — ?OMEPRAZOLE 20MG CAP DR: 20 | 7 days supply | Qty: 30 | Fill #0

## 2019-06-06 ENCOUNTER — Ambulatory Visit: Payer: Self-pay | Admitting: Family Medicine

## 2019-06-12 ENCOUNTER — Encounter (HOSPITAL_COMMUNITY): Payer: Self-pay

## 2019-06-12 ENCOUNTER — Encounter (HOSPITAL_COMMUNITY): Payer: Self-pay | Admitting: *Deleted

## 2019-07-11 ENCOUNTER — Other Ambulatory Visit: Payer: Self-pay | Admitting: Family Medicine

## 2019-07-11 DIAGNOSIS — R030 Elevated blood-pressure reading, without diagnosis of hypertension: Secondary | ICD-10-CM

## 2019-07-11 DIAGNOSIS — G4700 Insomnia, unspecified: Secondary | ICD-10-CM

## 2019-07-11 DIAGNOSIS — M329 Systemic lupus erythematosus, unspecified: Secondary | ICD-10-CM

## 2019-07-11 MED FILL — ?HYDROXYCHLOROQUINE 200 MG: 200 | 15 days supply | Qty: 30 | Fill #0

## 2019-07-11 MED FILL — CLOBETASOL PROPIONATE 0.05: 0.05 | 30 days supply | Qty: 60 | Fill #1

## 2019-07-11 MED FILL — guanFACINE HCL 1 MG TABS: 1 | 30 days supply | Qty: 30 | Fill #2

## 2019-07-12 MED FILL — cloNIDine HCL 0.2 MG TABS: 0.2 | 30 days supply | Qty: 30 | Fill #0

## 2019-07-12 MED FILL — ?OMEPRAZOLE 20MG CAP DR: 20 | 7 days supply | Qty: 30 | Fill #0

## 2019-07-26 MED FILL — OLANZapine 20 MG TABS: 20 | 30 days supply | Qty: 30 | Fill #1

## 2019-07-29 ENCOUNTER — Other Ambulatory Visit: Payer: Self-pay

## 2019-07-29 ENCOUNTER — Telehealth: Payer: Self-pay | Admitting: Internal Medicine

## 2019-07-29 DIAGNOSIS — F310 Bipolar disorder, current episode hypomanic: Secondary | ICD-10-CM

## 2019-07-29 MED ORDER — OLANZAPINE 20 MG PO TABS: 20.0000 mg | ORAL_TABLET | Freq: Every day | ORAL | 0 refills | Status: DC

## 2019-07-30 ENCOUNTER — Other Ambulatory Visit: Payer: Self-pay | Admitting: Internal Medicine

## 2019-07-30 NOTE — Telephone Encounter (Signed)
Refilled

## 2019-08-06 ENCOUNTER — Other Ambulatory Visit: Payer: Self-pay | Admitting: Family Medicine

## 2019-08-06 DIAGNOSIS — F310 Bipolar disorder, current episode hypomanic: Secondary | ICD-10-CM

## 2019-08-08 ENCOUNTER — Other Ambulatory Visit: Payer: Self-pay

## 2019-08-08 DIAGNOSIS — F310 Bipolar disorder, current episode hypomanic: Secondary | ICD-10-CM

## 2019-08-08 MED ORDER — LAMOTRIGINE 200 MG PO TABS: 200.0000 mg | ORAL_TABLET | Freq: Every day | ORAL | 2 refills | Status: DC

## 2019-08-08 MED ORDER — OLANZAPINE 20 MG PO TABS: 20.0000 mg | ORAL_TABLET | Freq: Every day | ORAL | 2 refills | Status: DC

## 2019-08-08 MED FILL — lamoTRIgine 200 MG TABS: 200 | 30 days supply | Qty: 30 | Fill #0

## 2019-08-08 MED FILL — OLANZapine 20 MG TABS: 20 | 30 days supply | Qty: 30 | Fill #0

## 2019-08-09 ENCOUNTER — Other Ambulatory Visit: Payer: Self-pay | Admitting: Family Medicine

## 2019-08-09 DIAGNOSIS — M329 Systemic lupus erythematosus, unspecified: Secondary | ICD-10-CM

## 2019-08-09 MED FILL — CLOBETASOL PROPIONATE 0.05: 0.05 | 30 days supply | Qty: 60 | Fill #1

## 2019-08-09 MED FILL — MONO-LINYAH 28 TABLET: 0.25-35 | 28 days supply | Qty: 28 | Fill #1

## 2019-08-09 MED FILL — ?HYDROXYCHLOROQUINE 200 MG: 200 | 30 days supply | Qty: 60 | Fill #0

## 2019-08-09 MED FILL — cloNIDine HCL 0.2 MG TABS: 0.2 | 30 days supply | Qty: 30 | Fill #0

## 2019-08-09 MED FILL — ?OMEPRAZOLE 20MG CAP DR: 20 | 7 days supply | Qty: 30 | Fill #0

## 2019-08-13 ENCOUNTER — Ambulatory Visit: Payer: Medicaid Other | Admitting: Family Medicine

## 2019-09-10 ENCOUNTER — Ambulatory Visit: Payer: Medicaid Other | Admitting: Family Medicine

## 2019-09-16 ENCOUNTER — Other Ambulatory Visit: Payer: Self-pay | Admitting: Internal Medicine

## 2019-09-16 ENCOUNTER — Other Ambulatory Visit: Payer: Self-pay | Admitting: Family Medicine

## 2019-09-16 DIAGNOSIS — M329 Systemic lupus erythematosus, unspecified: Secondary | ICD-10-CM

## 2019-09-16 MED FILL — MONO-LINYAH 28 TABLET: 0.25-35 | 28 days supply | Qty: 28 | Fill #2

## 2019-09-16 MED FILL — ?HYDROXYCHLOROQUINE 200 MG: 200 | 15 days supply | Qty: 30 | Fill #1

## 2019-09-16 MED FILL — OLANZapine 20 MG TABS: 20 | 30 days supply | Qty: 30 | Fill #1

## 2019-09-16 MED FILL — lamoTRIgine 200 MG TABS: 200 | 30 days supply | Qty: 30 | Fill #1

## 2019-09-17 ENCOUNTER — Other Ambulatory Visit: Payer: Self-pay | Admitting: Family Medicine

## 2019-09-17 DIAGNOSIS — M329 Systemic lupus erythematosus, unspecified: Secondary | ICD-10-CM

## 2019-09-17 MED FILL — ?CLONIDINE HCL 0.2 MG TABS: 0.2 | 30 days supply | Qty: 30 | Fill #0

## 2019-10-03 MED FILL — ?OMEPRAZOLE 20MG CAP DR: 20 | 7 days supply | Qty: 30 | Fill #0

## 2019-10-14 ENCOUNTER — Ambulatory Visit (INDEPENDENT_AMBULATORY_CARE_PROVIDER_SITE_OTHER): Payer: Self-pay | Admitting: Nurse Practitioner

## 2019-10-14 ENCOUNTER — Other Ambulatory Visit: Payer: Self-pay

## 2019-10-14 ENCOUNTER — Encounter: Payer: Self-pay | Admitting: Nurse Practitioner

## 2019-10-14 ENCOUNTER — Ambulatory Visit: Payer: Medicaid Other | Admitting: Nurse Practitioner

## 2019-10-14 VITALS — BP 136/92 | HR 78 | Temp 98.1°F | Resp 18 | Ht 64.0 in | Wt 176.0 lb

## 2019-10-14 DIAGNOSIS — G4700 Insomnia, unspecified: Secondary | ICD-10-CM

## 2019-10-14 DIAGNOSIS — R030 Elevated blood-pressure reading, without diagnosis of hypertension: Secondary | ICD-10-CM

## 2019-10-14 DIAGNOSIS — F310 Bipolar disorder, current episode hypomanic: Secondary | ICD-10-CM

## 2019-10-14 DIAGNOSIS — M329 Systemic lupus erythematosus, unspecified: Secondary | ICD-10-CM

## 2019-10-14 DIAGNOSIS — M2550 Pain in unspecified joint: Secondary | ICD-10-CM

## 2019-10-14 MED ORDER — GABAPENTIN 100 MG PO CAPS: 100.0000 mg | ORAL_CAPSULE | Freq: Three times a day (TID) | ORAL | 0 refills | Status: DC

## 2019-10-14 MED ORDER — LAMOTRIGINE 200 MG PO TABS: 200.0000 mg | ORAL_TABLET | Freq: Every day | ORAL | 2 refills | Status: DC

## 2019-10-14 MED ORDER — HYDROXYCHLOROQUINE SULFATE 200 MG PO TABS: 400.0000 mg | ORAL_TABLET | Freq: Every day | ORAL | 1 refills | Status: DC

## 2019-10-14 MED ORDER — KETOROLAC TROMETHAMINE 60 MG/2ML IM SOLN
60.0000 mg | Freq: Once | INTRAMUSCULAR | Status: AC
  Administered 2019-10-14: 12:00:00 60 mg via INTRAMUSCULAR

## 2019-10-14 MED ORDER — CLOBETASOL PROPIONATE 0.05 % EX CREA: 1.0000 "application " | TOPICAL_CREAM | Freq: Two times a day (BID) | CUTANEOUS | 2 refills | Status: DC

## 2019-10-14 MED ORDER — OLANZAPINE 20 MG PO TABS: 20.0000 mg | ORAL_TABLET | Freq: Every day | ORAL | 2 refills | Status: DC

## 2019-10-14 MED ORDER — MAGIC MOUTHWASH W/LIDOCAINE: 5.0000 mL | Freq: Three times a day (TID) | ORAL | 0 refills | Status: AC | PRN

## 2019-10-14 MED ORDER — CLONIDINE HCL 0.2 MG PO TABS: 0.2000 mg | ORAL_TABLET | Freq: Every day | ORAL | 2 refills | Status: DC

## 2019-10-14 MED ORDER — OMEPRAZOLE 20 MG PO CPDR: DELAYED_RELEASE_CAPSULE | ORAL | 0 refills | Status: DC

## 2019-10-14 MED ORDER — PREDNISONE 20 MG PO TABS: 20.0000 mg | ORAL_TABLET | Freq: Two times a day (BID) | ORAL | 1 refills | Status: AC

## 2019-10-14 MED FILL — ?CLONIDINE HCL 0.2 MG TABS: 0.2 | 30 days supply | Qty: 30 | Fill #1

## 2019-10-14 MED FILL — OLANZapine 20 MG TABS: 20 | 30 days supply | Qty: 30 | Fill #2

## 2019-10-14 MED FILL — MONO-LINYAH 28 TABLET: 0.25-35 | 28 days supply | Qty: 28 | Fill #3

## 2019-10-14 MED FILL — lamoTRIgine 200 MG TABS: 200 | 30 days supply | Qty: 30 | Fill #2

## 2019-10-14 NOTE — Progress Notes (Signed)
Established Patient Office Visit  Subjective:  Patient ID: Wendy Herman, female    DOB: 1986/07/24  Age: 34 y.o. MRN: [id number]  CC:  Chief Complaint  Patient presents with  . Follow-up  . Lupus    requesting an rx for pain   . Medication Refill    clonidine, prednison, plaquinal (SP), lamictal, xyprexa, omeprazole  . Joint Pain    states "knees are swollen"   . Menstrual Problem    no period since october 2020     HPI Wendy Herman presents for follow up with her Lupus.  She is with her mom.  She has been diagnosed with lupus since the age of 66.  She has not seen a rheumatologist since she moved here from [place], and she does not currently live in [place].  They admits that her medications have just been refilled everyone has just been "following suit" without any changes.  She admits that she has has missed several appointments and she admits that she need pain management.. She would like to have some "blood work".She is having insurance problems She has been denied medicaid. She was seeing a specialist. She has a sore in her mouth . She admits that this comes with flare ups.  She has been on methotrexate in the past but did discontinue this secondary to increased hair loss. She has "rash on her face that is not typical ".  She is having joint pain in her hands, shoulders and knees.  She has an allergy to nonsteroidals.  She has taken tramadol in the past however the Tramadol was not effective. She feels like it never made any difference.  She never noticed any relief at all with taking the medication.  She has tried muscle relaxers in the past do not feel that they are effective.  Her mother admits that she uses Percocet for chronic back pain and that she has shared her medication with [name]. She also has a history of bipolar disorder.  Her mom states that she was told that this was a typical bipolar.  Her mom does feel that she has been having some episodes of mania.   She does not have any current psychiatrist again secondary to insurance problems.    Past Medical History:  Diagnosis Date  . Arthritis   . Chronic pain   . Fibromyalgia   . Lupus (HCC)   . Neutropenia [facility])     Past Surgical History:  Procedure Laterality Date  . CESAREAN SECTION      Family History  Problem Relation Age of Onset  . Hypertension Mother   . Heart attack Father        In his 88s. Died from it.  . Rheum arthritis Maternal Grandmother   . Lupus Maternal Grandmother   . Diabetes Maternal Grandmother   . Heart disease Maternal Grandfather   . Diabetes Maternal Grandfather   . Breast cancer Maternal Aunt   . Pancreatic cancer Maternal Uncle   . Diabetes Maternal Aunt   . Diabetes Maternal Aunt     Social History   Socioeconomic History  . Marital status: Divorced    Spouse name: Not on file  . Number of children: Not on file  . Years of education: Not on file  . Highest education level: Not on file  Occupational History  . Not on file  Tobacco Use  . Smoking status: Former Games developer  . Smokeless tobacco: Never Used  Substance and Sexual Activity  .  Alcohol use: No  . Drug use: No  . Sexual activity: Never  Other Topics Concern  . Not on file  Social History Narrative  . Not on file   Social Determinants of Health   Financial Resource Strain:   . Difficulty of Paying Living Expenses: Not on file  Food Insecurity:   . Worried About Charity fundraiser in the Last Year: Not on file  . Ran Out of Food in the Last Year: Not on file  Transportation Needs:   . Lack of Transportation (Medical): Not on file  . Lack of Transportation (Non-Medical): Not on file  Physical Activity:   . Days of Exercise per Week: Not on file  . Minutes of Exercise per Session: Not on file  Stress:   . Feeling of Stress : Not on file  Social Connections:   . Frequency of Communication with Friends and Family: Not on file  . Frequency of Social Gatherings with Friends  and Family: Not on file  . Attends Religious Services: Not on file  . Active Member of Clubs or Organizations: Not on file  . Attends Archivist Meetings: Not on file  . Marital Status: Not on file  Intimate Partner Violence:   . Fear of Current or Ex-Partner: Not on file  . Emotionally Abused: Not on file  . Physically Abused: Not on file  . Sexually Abused: Not on file    Outpatient Medications Prior to Visit  Medication Sig Dispense Refill  . Calcium Carb-Cholecalciferol (CALCIUM 600 + D PO) Take 1 tablet by mouth daily.    . diphenhydrAMINE (BENADRYL) 25 mg capsule Take 200 mg by mouth at bedtime as needed for sleep.    . polyethylene glycol powder (GLYCOLAX/MIRALAX) 17 GM/SCOOP powder Take 17 g by mouth 2 (two) times daily as needed. 3350 g 1  . clobetasol cream (TEMOVATE) 2.35 % Apply 1 application topically 2 (two) times daily. 60 g 2  . cloNIDine (CATAPRES) 0.2 MG tablet TAKE 1 TABLET (0.2 MG TOTAL) BY MOUTH AT BEDTIME. 30 tablet 2  . hydroxychloroquine (PLAQUENIL) 200 MG tablet Take 2 tablets (400 mg total) by mouth daily. 60 tablet 1  . lamoTRIgine (LAMICTAL) 200 MG tablet Take 1 tablet (200 mg total) by mouth daily. 30 tablet 2  . OLANZapine (ZYPREXA) 20 MG tablet Take 1 tablet (20 mg total) by mouth at bedtime. Must have appt for more refills. 30 tablet 2  . omeprazole (PRILOSEC) 20 MG capsule TAKE 2 CAPSULES (40 MG TOTAL) BY MOUTH 2 (TWO) TIMES DAILY AS NEEDED (FOR ACID REFLUX). 30 capsule 0  . predniSONE (DELTASONE) 20 MG tablet Take 20 mg by mouth 2 (two) times daily with a meal.    . guanFACINE (TENEX) 1 MG tablet Take 1 tablet (1 mg total) by mouth at bedtime. (Patient not taking: Reported on 10/14/2019) 30 tablet 2  . traMADol (ULTRAM) 50 MG tablet TAKE 1 TABLET BY MOUTH EVERY 8 HOURS AS NEEDED FOR UP TO 10 DAYS FOR MODERATE PAIN. (Patient not taking: Reported on 10/14/2019) 30 tablet 0  . Bacillus Coagulans-Inulin (PROBIOTIC FORMULA PO) Take 1-2 capsules by mouth  daily.    . capsicum (ZOSTRIX) 0.075 % topical cream Apply 1 application topically 2 (two) times daily. 28.3 g 0  . gabapentin (NEURONTIN) 100 MG capsule Take 1 capsule (100 mg total) by mouth 3 (three) times daily. (Patient not taking: Reported on 10/14/2019) 90 capsule 0  . norgestimate-ethinyl estradiol (SPRINTEC 28) 0.25-35 MG-MCG tablet  Take 1 tablet by mouth daily. (Patient not taking: Reported on 04/04/2019) 1 Package 11  . oxyCODONE-acetaminophen (PERCOCET) 5-325 MG tablet Take 1 tablet by mouth every 4 (four) hours as needed for moderate pain or severe pain. 20 tablet 0   No facility-administered medications prior to visit.    Allergies  Allergen Reactions  . Codeine Swelling  . Nsaids Other (See Comments)    Interacts with GERD  . Sulfa Antibiotics Swelling    ROS Review of Systems  Constitutional: Positive for fatigue.  HENT: Positive for dental problem and mouth sores.   Eyes: Negative.   Respiratory: Negative.   Cardiovascular: Negative.   Gastrointestinal: Negative.   Endocrine: Negative.   Genitourinary: Negative.   Musculoskeletal: Positive for arthralgias.  Skin: Positive for rash.  Neurological: Negative.   Hematological: Negative.   Psychiatric/Behavioral: Negative.       Objective:    Physical Exam  Constitutional: She is oriented to person, place, and time. She appears well-developed and well-nourished.  HENT:  Head: Normocephalic.  Eyes: Pupils are equal, round, and reactive to light.  Cardiovascular: Normal rate, regular rhythm, normal heart sounds and intact distal pulses.  Pulmonary/Chest: Effort normal and breath sounds normal.  Abdominal: Soft. Bowel sounds are normal.  Musculoskeletal:        General: Tenderness present.     Cervical back: Normal range of motion and neck supple.     Comments: Decreased range of motion bilateral shoulders secondary to pain tenderness with palpation. Weakness in bilateral handgrips, weakness with passive  resistance  Neurological: She is alert and oriented to person, place, and time.  Skin: Skin is warm and dry. Rash noted. There is erythema.  Lower portion of face  Psychiatric: She has a normal mood and affect. Her behavior is normal. Judgment and thought content normal.    BP (!) 136/92 (BP Location: Left Arm, Patient Position: Sitting, Cuff Size: Large) Comment: manually  Pulse 78   Temp 98.1 F (36.7 C) (Oral)   Resp 18   Ht 5\' 4"  (1.626 m)   Wt 176 lb (79.8 kg)   SpO2 100%   BMI 30.21 kg/m  Wt Readings from Last 3 Encounters:  10/14/19 176 lb (79.8 kg)  04/10/19 162 lb 15.8 oz (73.9 kg)  04/04/19 163 lb (73.9 kg)     Health Maintenance Due  Topic Date Due  . TETANUS/TDAP  10/26/2005  . PAP SMEAR-Modifier  10/27/2007  . INFLUENZA VACCINE  05/04/2019    There are no preventive care reminders to display for this patient.  No results found for: TSH Lab Results  Component Value Date   WBC 3.3 (L) 12/07/2018   HGB 13.4 12/07/2018   HCT 40.7 12/07/2018   MCV 90 12/07/2018   PLT 323 12/07/2018   Lab Results  Component Value Date   NA 140 10/14/2019   K 4.3 10/14/2019   CO2 26 03/06/2019   GLUCOSE 94 10/14/2019   BUN 13 10/14/2019   CREATININE 0.82 10/14/2019   BILITOT 0.3 10/14/2019   ALKPHOS 76 10/14/2019   AST 21 10/14/2019   ALT 25 03/06/2019   PROT 7.0 10/14/2019   ALBUMIN 4.7 10/14/2019   CALCIUM 9.8 10/14/2019   ANIONGAP 11 10/08/2018   No results found for: CHOL No results found for: HDL No results found for: LDLCALC No results found for: TRIG No results found for: CHOLHDL No results found for: 12/07/2018    Assessment & Plan:   Problem List Items Addressed This Visit  Unprioritized   Bipolar affective disorder, current episode hypomanic (HCC)   Relevant Medications   lamoTRIgine (LAMICTAL) 200 MG tablet   OLANZapine (ZYPREXA) 20 MG tablet   Other Relevant Orders   Lamotrigine level (Completed)   SLE (systemic lupus erythematosus) (HCC)  - Primary   Relevant Medications   clobetasol cream (TEMOVATE) 0.05 %   hydroxychloroquine (PLAQUENIL) 200 MG tablet   Other Relevant Orders   Comprehensive Metabolic Panel w/GFR (Completed)   Magnesium (Completed)   Vitamin B12 (Completed)   Ambulatory referral to Pain Clinic   Ambulatory referral to Rheumatology    Other Visit Diagnoses    Elevated blood pressure reading       Relevant Medications   cloNIDine (CATAPRES) 0.2 MG tablet   Insomnia, unspecified type       Relevant Medications   cloNIDine (CATAPRES) 0.2 MG tablet   Generalized joint pain       Relevant Medications   ketorolac (TORADOL) injection 60 mg (Completed)   Other Relevant Orders   Sedimentation rate (Completed)   Vitamin D (D2+D3) (Completed)   CRP (Completed)   Ambulatory referral to Pain Clinic      Meds ordered this encounter  Medications  . cloNIDine (CATAPRES) 0.2 MG tablet    Sig: Take 1 tablet (0.2 mg total) by mouth at bedtime.    Dispense:  30 tablet    Refill:  2    Order Specific Question:   Supervising Provider    Answer:   Quentin AngstJEGEDE, OLUGBEMIGA E [id number]]  . clobetasol cream (TEMOVATE) 0.05 %    Sig: Apply 1 application topically 2 (two) times daily.    Dispense:  60 g    Refill:  2    Order Specific Question:   Supervising Provider    Answer:   Quentin AngstJEGEDE, OLUGBEMIGA E [id number]]  . hydroxychloroquine (PLAQUENIL) 200 MG tablet    Sig: Take 2 tablets (400 mg total) by mouth daily.    Dispense:  60 tablet    Refill:  1    Order Specific Question:   Supervising Provider    Answer:   Quentin AngstJEGEDE, OLUGBEMIGA E [id number]]  . lamoTRIgine (LAMICTAL) 200 MG tablet    Sig: Take 1 tablet (200 mg total) by mouth daily.    Dispense:  30 tablet    Refill:  2    Order Specific Question:   Supervising Provider    Answer:   Quentin AngstJEGEDE, OLUGBEMIGA E [id number]]  . OLANZapine (ZYPREXA) 20 MG tablet    Sig: Take 1 tablet (20 mg total) by mouth at bedtime. Must have appt for more refills.    Dispense:  30 tablet     Refill:  2    Order Specific Question:   Supervising Provider    Answer:   Quentin AngstJEGEDE, OLUGBEMIGA E [id number]]  . omeprazole (PRILOSEC) 20 MG capsule    Sig: TAKE 2 CAPSULES (40 MG TOTAL) BY MOUTH 2 (TWO) TIMES DAILY AS NEEDED (FOR ACID REFLUX).    Dispense:  30 capsule    Refill:  0    Order Specific Question:   Supervising Provider    Answer:   Quentin AngstJEGEDE, OLUGBEMIGA E [id number]]  . predniSONE (DELTASONE) 20 MG tablet    Sig: Take 1 tablet (20 mg total) by mouth 2 (two) times daily with a meal.    Dispense:  60 tablet    Refill:  1    Order Specific Question:   Supervising Provider    Answer:   Quentin AngstJEGEDE, OLUGBEMIGA E [id number]]  .  gabapentin (NEURONTIN) 100 MG capsule    Sig: Take 1 capsule (100 mg total) by mouth 3 (three) times daily.    Dispense:  90 capsule    Refill:  0    Order Specific Question:   Supervising Provider    Answer:   Quentin Angst [id number]  . ketorolac (TORADOL) injection 60 mg  . magic mouthwash w/lidocaine SOLN    Sig: Take 5 mLs by mouth 3 (three) times daily as needed for up to 15 days for mouth pain.    Dispense:  120 mL    Refill:  0    Order Specific Question:   Supervising Provider    Answer:   Quentin Angst [[id number]]    Follow-up: Return in about 2 months (around 12/12/2019).    Barbette Merino, NP

## 2019-10-14 NOTE — Patient Instructions (Signed)
Systemic Lupus Erythematosus, Adult  Systemic lupus erythematosus (SLE) is a long-term (chronic) disease that can affect many parts of the body. SLE is an autoimmune disease. With this type of disease, the body's defense system (immune system) mistakenly attacks healthy tissues. This can cause damage to the skin, joints, blood vessels, brain, kidneys, lungs, heart, and other internal organs. It causes pain, irritation, and inflammation.  What are the causes?  The cause of this condition is not known.  What increases the risk?  The following factors may make you more likely to develop this condition:  · Being female.  · Being of Asian, Hispanic, or African-American descent.  · Having a family history of the condition.  · Being exposed to tobacco smoke or smoking cigarettes.  · Having an infection with a virus, such as Epstein-Barr virus.  · Having a history of exposure to silica dust, metals, chemicals, mold or mildew, or insecticides.  · Using oral contraceptives or hormone replacement therapy.  What are the signs or symptoms?  This condition can affect almost any organ or system in the body. Symptoms of the condition depend on which organ or system is affected.  The most common symptoms include:  · Fever.  · Fatigue.  · Weight loss.  · Muscle aches.  · Joint pain.  · Skin rashes, especially over the nose and cheeks (butterfly rash) and after sun exposure.  Symptoms can come and go. A period of time when symptoms get worse or come back is called a flare. A period of time with no symptoms is called a remission.  How is this diagnosed?  This condition is diagnosed based on:  · Your symptoms.  · Your medical history.  · A physical exam.  You may also have tests, including:  · Blood tests.  · Urine tests.  · A chest X-ray.  You may be referred to an autoimmune disease specialist (rheumatologist).  How is this treated?  There is no cure for this condition, but treatment can help to control symptoms, prevent flares (keep  symptoms in remission), and prevent damage to the heart, lungs, kidneys, and other organs. Treatment will depend on what symptoms you are having and what organs or systems are affected. Treatment may involve taking a combination of medicines over time.  Common medicines used to treat this condition include:  · Antimalarial medicines to control symptoms, prevent flares, and protect against organ damage.  · Corticosteroids and NSAIDs to reduce inflammation.  · Medicines to weaken your immune system (immunosuppressants).  · Biologic response modifiers to reduce inflammation and damage.  Follow these instructions at home:  Eating and drinking  · Eat a heart-healthy diet. This may include:  ? Eating high-fiber foods, such as fresh fruits and vegetables, whole grains, and beans.  ? Eating heart-healthy fats (omega-3 fats), such as fish, flaxseed, and flaxseed oil.  ? Limiting foods that are high in saturated fat and cholesterol, such as processed and fried foods, fatty meat, and full-fat dairy.  ? Limiting how much salt (sodium) you eat.  · Include calcium and vitamin D in your diet. Good sources of calcium and vitamin D include:  ? Low-fat dairy products such as milk, yogurt, and cheese.  ? Certain fish, such as fresh or canned salmon, tuna, and sardines.  ? Products that have calcium and vitamin D added to them (fortified products), such as fortified cereals or juice.  Medicines  · Take over-the-counter and prescription medicines only as told by your health   that contain nicotine or tobacco, such as cigarettes and e-cigarettes. If you need help quitting, ask your health care provider.  Protect your skin from the sun by applying  sunblock and wearing protective hats and clothing.  Learn as much as you can about your condition and have a good support system in place. Support may come from family, friends, or a lupus support group. General instructions  Work closely with all of your health care providers to manage your condition.  Stay up to date on all vaccines as directed by your health care provider.  Keep all follow-up visits as told by your health care provider. This is important. Contact a health care provider if:  You have a fever.  Your symptoms flare.  You develop new symptoms.  You have bloody, foamy, or coffee-colored urine.  There are changes in your urination. For example, you urinate more often at night.  You think that you may be depressed or have anxiety.  You become pregnant or plan to become pregnant. Pregnancy in women with this condition is considered high risk. Get help right away if:  You have chest pain.  You have trouble breathing.  You have a seizure.  You suddenly get a very bad headache.  You suddenly develop facial or body weakness.  You cannot speak.  You cannot understand speech. These symptoms may represent a serious problem that is an emergency. Do not wait to see if the symptoms will go away. Get medical help right away. Call your local emergency services (911 in the [place] Do not drive yourself to the hospital. Summary  Systemic lupus erythematosus (SLE) is a long-term disease that can affect many parts of the body.  SLE is an autoimmune disease. That means your body's defense system (immune system) mistakenly attacks healthy tissues.  There is no cure for this condition, but treatment can help to control symptoms, prevent flares, and prevent damage to your organs. Treatment may involve taking a combination of medicines over time. This information is not intended to replace advice given to you by your health care provider. Make sure you discuss any questions you  have with your health care provider. Document Revised: 11/02/2017 Document Reviewed: 10/27/2017 Elsevier Patient Education  2020 [employer] Joint Pain  Joint pain can be caused by many things. It is likely to go away if you follow instructions from your doctor for taking care of yourself at home. Sometimes, you may need more treatment. Follow these instructions at home: Managing pain, stiffness, and swelling   If told, put ice on the painful area. ? Put ice in a plastic bag. ? Place a towel between your skin and the bag. ? Leave the ice on for 20 minutes, 2-3 times a day.  If told, put heat on the painful area. Do this as often as told by your doctor. Use the heat source that your doctor recommends, such as a moist heat pack or a heating pad. ? Place a towel between your skin and the heat source. ? Leave the heat on for 20-30 minutes. ? Take off the heat if your skin gets bright red. This is especially important if you are unable to feel pain, heat, or cold. You may have a greater risk of getting burned.  Move your fingers or toes below the painful joint often. This helps with stiffness and swelling.  If possible, raise (elevate) the painful joint above the level of your heart while you are sitting or lying down. To do  this, try putting a few pillows under the painful joint. Activity  Rest the painful joint for as long as told. Do not do things that cause pain or make your pain worse.  Begin exercising or stretching the affected area, as told by your doctor. Ask your doctor what types of exercise are safe for you. If you have an elastic bandage, sling, or splint:  Wear the device as told by your doctor. Take it off only as told by your doctor.  Loosen the device if your fingers or toes below the joint: ? Tingle. ? Lose feeling (get numb). ? Get cold and blue.  Keep the device clean.  Ask your doctor if you should take off the device before bathing. You may need to cover it  with a watertight covering when you take a bath or a shower. General instructions  Take over-the-counter and prescription medicines only as told by your doctor.  Do not use any products that contain nicotine or tobacco. These include cigarettes and e-cigarettes. If you need help quitting, ask your doctor.  Keep all follow-up visits as told by your doctor. This is important. Contact a doctor if:  You have pain that gets worse and does not get better with medicine.  Your joint pain does not get better in 3 days.  You have more bruising or swelling.  You have a fever.  You lose 10 lb (4.5 kg) or more without trying. Get help right away if:  You cannot move the joint.  Your fingers or toes tingle, lose feeling, or get cold and blue.  You have a fever along with a joint that is red, warm, and swollen. Summary  Joint pain can be caused by many things. It often goes away if you follow instructions from your doctor for taking care of yourself at home.  Rest the painful joint for as long as told. Do not do things that cause pain or make your pain worse.  Take over-the-counter and prescription medicines only as told by your doctor. This information is not intended to replace advice given to you by your health care provider. Make sure you discuss any questions you have with your health care provider. Document Revised: 09/01/2017 Document Reviewed: 07/05/2017 Elsevier Patient Education  [place].

## 2019-10-19 LAB — COMP. METABOLIC PANEL (12)
AST: 21 IU/L (ref 0–40)
Albumin/Globulin Ratio: 2 (ref 1.2–2.2)
Albumin: 4.7 g/dL (ref 3.8–4.8)
Alkaline Phosphatase: 76 IU/L (ref 39–117)
BUN/Creatinine Ratio: 16 (ref 9–23)
BUN: 13 mg/dL (ref 6–20)
Bilirubin Total: 0.3 mg/dL (ref 0.0–1.2)
Calcium: 9.8 mg/dL (ref 8.7–10.2)
Chloride: 105 mmol/L (ref 96–106)
Creatinine, Ser: 0.82 mg/dL (ref 0.57–1.00)
GFR calc Af Amer: 109 mL/min/{1.73_m2} (ref >59)
GFR calc non Af Amer: 95 mL/min/{1.73_m2} (ref >59)
Globulin, Total: 2.3 g/dL (ref 1.5–4.5)
Glucose: 94 mg/dL (ref 65–99)
Potassium: 4.3 mmol/L (ref 3.5–5.2)
Sodium: 140 mmol/L (ref 134–144)
Total Protein: 7 g/dL (ref 6.0–8.5)

## 2019-10-19 LAB — 25-HYDROXY VITAMIN D LCMS D2+D3
25-Hydroxy, Vitamin D-2: <1 ng/mL
25-Hydroxy, Vitamin D-3: 20 ng/mL
25-Hydroxy, Vitamin D: 21 ng/mL — ABNORMAL LOW

## 2019-10-19 LAB — VITAMIN B12: Vitamin B-12: 752 pg/mL (ref 232–1245)

## 2019-10-19 LAB — C-REACTIVE PROTEIN: CRP: <1 mg/L (ref 0–10)

## 2019-10-19 LAB — SEDIMENTATION RATE: Sed Rate: 18 mm/hr (ref 0–32)

## 2019-10-19 LAB — LAMOTRIGINE LEVEL: Lamotrigine Lvl: 6.6 ug/mL (ref 2.0–20.0)

## 2019-10-19 LAB — MAGNESIUM: Magnesium: 2.1 mg/dL (ref 1.6–2.3)

## 2019-10-21 ENCOUNTER — Encounter: Payer: Self-pay | Admitting: Nurse Practitioner

## 2019-10-21 ENCOUNTER — Other Ambulatory Visit: Payer: Self-pay | Admitting: Nurse Practitioner

## 2019-10-21 DIAGNOSIS — E559 Vitamin D deficiency, unspecified: Secondary | ICD-10-CM | POA: Insufficient documentation

## 2019-10-28 MED ORDER — ERGOCALCIFEROL 1.25 MG (50000 UT) PO CAPS: 50000.0000 [IU] | ORAL_CAPSULE | ORAL | 0 refills | Status: AC

## 2019-10-28 MED FILL — ?ERGOCALCIFEROL 50000 UNITC: 1.25 MG | 40 days supply | Qty: 12 | Fill #0

## 2019-10-29 ENCOUNTER — Ambulatory Visit: Payer: Medicaid Other | Admitting: Family Medicine

## 2019-11-15 ENCOUNTER — Ambulatory Visit (INDEPENDENT_AMBULATORY_CARE_PROVIDER_SITE_OTHER): Payer: Self-pay | Admitting: Nurse Practitioner

## 2019-11-15 ENCOUNTER — Other Ambulatory Visit: Payer: Self-pay

## 2019-11-15 ENCOUNTER — Encounter: Payer: Self-pay | Admitting: Nurse Practitioner

## 2019-11-15 VITALS — BP 142/95 | HR 78 | Temp 98.7°F | Resp 16 | Ht 64.0 in | Wt 180.0 lb

## 2019-11-15 DIAGNOSIS — M2550 Pain in unspecified joint: Secondary | ICD-10-CM

## 2019-11-15 DIAGNOSIS — F310 Bipolar disorder, current episode hypomanic: Secondary | ICD-10-CM

## 2019-11-15 DIAGNOSIS — M329 Systemic lupus erythematosus, unspecified: Secondary | ICD-10-CM

## 2019-11-15 DIAGNOSIS — Z87891 Personal history of nicotine dependence: Secondary | ICD-10-CM

## 2019-11-15 MED ORDER — CHANTIX STARTING MONTH PAK 0.5 MG X 11 & 1 MG X 42 PO TABS: ORAL_TABLET | ORAL | 0 refills | Status: DC

## 2019-11-15 MED ORDER — ACETAMINOPHEN-CODEINE #3 300-30 MG PO TABS: 1.0000 | ORAL_TABLET | ORAL | 0 refills | Status: AC | PRN

## 2019-11-15 MED FILL — ?HYDROXYCHLOROQUINE 200 MG: 200 | 30 days supply | Qty: 60 | Fill #0

## 2019-11-15 MED FILL — ?CLONIDINE HCL 0.2 MG TABS: 0.2 | 30 days supply | Qty: 30 | Fill #2

## 2019-11-15 MED FILL — predniSONE 20 MG TABS: 20 | 30 days supply | Qty: 60 | Fill #0

## 2019-11-15 MED FILL — lamoTRIgine 200 MG TABS: 200 | 30 days supply | Qty: 30 | Fill #0

## 2019-11-15 MED FILL — ACETAMINOPHEN/COD #3 TABLET: 300-30 | 7 days supply | Qty: 30 | Fill #0

## 2019-11-15 MED FILL — OLANZapine 20 MG TABS: 20 | 30 days supply | Qty: 30 | Fill #0

## 2019-11-15 MED FILL — MONO-LINYAH 28 TABLET: 0.25-35 | 28 days supply | Qty: 28 | Fill #4

## 2019-11-15 NOTE — Patient Instructions (Signed)

## 2019-11-15 NOTE — Progress Notes (Signed)
Established Patient Office Visit  Subjective:  Patient ID: Wendy Herman, female    DOB: 1986-08-13  Age: 34 y.o. MRN: [id number]  CC:  Chief Complaint  Patient presents with  . Follow-up    Lupus   . Nicotine Dependence    wants to discuss starting chantix     HPI Wendy Herman presents for follow up.  She has a history of lupus, fibromyalgia, generalized arthritis and chronic pain.  At her last visit she was referred to rheumatology however because of her lack of insurance she was not able to be seen.  She has not heard anything from pain management.  She admits that that she is having pain in her shoulders hands and knees.  She feels like she has been doing a little better with her shoulder pain.  The knee pain has been worse.  She does feel like this is also related to her current weather.  She is having swelling and stiffness in her knees.  She denies any numbness tingling going down her leg.  She does continue to take the Plaquenil and prednisone.  She denies any side effects of her current medication.  She admits that she has not started the vitamin D as recommended with her previous lab results.  She admits that she does not like taking calcium because the pills are to big.   She is ready to quit smoking again.  She admits that she is doing this because she wants to spend more time with her 76-year-old daughter.  She has been on Chantix before.  She admits that she did well.  The only side effect she can remembered was nausea  Past Medical History:  Diagnosis Date  . Arthritis   . Chronic pain   . Fibromyalgia   . Lupus (HCC)   . Neutropenia [facility])     Past Surgical History:  Procedure Laterality Date  . CESAREAN SECTION      Family History  Problem Relation Age of Onset  . Hypertension Mother   . Heart attack Father        In his 67s. Died from it.  . Rheum arthritis Maternal Grandmother   . Lupus Maternal Grandmother   . Diabetes Maternal Grandmother   .  Heart disease Maternal Grandfather   . Diabetes Maternal Grandfather   . Breast cancer Maternal Aunt   . Pancreatic cancer Maternal Uncle   . Diabetes Maternal Aunt   . Diabetes Maternal Aunt     Social History   Socioeconomic History  . Marital status: Divorced    Spouse name: Not on file  . Number of children: Not on file  . Years of education: Not on file  . Highest education level: Not on file  Occupational History  . Not on file  Tobacco Use  . Smoking status: Former Games developer  . Smokeless tobacco: Never Used  Substance and Sexual Activity  . Alcohol use: No  . Drug use: No  . Sexual activity: Never  Other Topics Concern  . Not on file  Social History Narrative  . Not on file   Social Determinants of Health   Financial Resource Strain:   . Difficulty of Paying Living Expenses: Not on file  Food Insecurity:   . Worried About Programme researcher, broadcasting/film/video in the Last Year: Not on file  . Ran Out of Food in the Last Year: Not on file  Transportation Needs:   . Lack of Transportation (Medical): Not on  file  . Lack of Transportation (Non-Medical): Not on file  Physical Activity:   . Days of Exercise per Week: Not on file  . Minutes of Exercise per Session: Not on file  Stress:   . Feeling of Stress : Not on file  Social Connections:   . Frequency of Communication with Friends and Family: Not on file  . Frequency of Social Gatherings with Friends and Family: Not on file  . Attends Religious Services: Not on file  . Active Member of Clubs or Organizations: Not on file  . Attends Archivist Meetings: Not on file  . Marital Status: Not on file  Intimate Partner Violence:   . Fear of Current or Ex-Partner: Not on file  . Emotionally Abused: Not on file  . Physically Abused: Not on file  . Sexually Abused: Not on file    Outpatient Medications Prior to Visit  Medication Sig Dispense Refill  . clobetasol cream (TEMOVATE) 8.18 % Apply 1 application topically 2 (two)  times daily. 60 g 2  . cloNIDine (CATAPRES) 0.2 MG tablet Take 1 tablet (0.2 mg total) by mouth at bedtime. 30 tablet 2  . diphenhydrAMINE (BENADRYL) 25 mg capsule Take 200 mg by mouth at bedtime as needed for sleep.    . hydroxychloroquine (PLAQUENIL) 200 MG tablet Take 2 tablets (400 mg total) by mouth daily. 60 tablet 1  . lamoTRIgine (LAMICTAL) 200 MG tablet Take 1 tablet (200 mg total) by mouth daily. 30 tablet 2  . OLANZapine (ZYPREXA) 20 MG tablet Take 1 tablet (20 mg total) by mouth at bedtime. Must have appt for more refills. 30 tablet 2  . omeprazole (PRILOSEC) 20 MG capsule TAKE 2 CAPSULES (40 MG TOTAL) BY MOUTH 2 (TWO) TIMES DAILY AS NEEDED (FOR ACID REFLUX). 30 capsule 0  . polyethylene glycol powder (GLYCOLAX/MIRALAX) 17 GM/SCOOP powder Take 17 g by mouth 2 (two) times daily as needed. 3350 g 1  . predniSONE (DELTASONE) 20 MG tablet Take 40 mg by mouth daily with breakfast.    . traMADol (ULTRAM) 50 MG tablet TAKE 1 TABLET BY MOUTH EVERY 8 HOURS AS NEEDED FOR UP TO 10 DAYS FOR MODERATE PAIN. 30 tablet 0  . Calcium Carb-Cholecalciferol (CALCIUM 600 + D PO) Take 1 tablet by mouth daily.    . ergocalciferol (VITAMIN D2) 1.25 MG (50000 UT) capsule Take 1 capsule (50,000 Units total) by mouth once a week. X 6 weeks. (Patient not taking: Reported on 11/15/2019) 12 capsule 0  . gabapentin (NEURONTIN) 100 MG capsule Take 1 capsule (100 mg total) by mouth 3 (three) times daily. (Patient not taking: Reported on 11/15/2019) 90 capsule 0  . guanFACINE (TENEX) 1 MG tablet Take 1 tablet (1 mg total) by mouth at bedtime. (Patient not taking: Reported on 10/14/2019) 30 tablet 2   No facility-administered medications prior to visit.    Allergies  Allergen Reactions  . Codeine Swelling  . Nsaids Other (See Comments)    Interacts with GERD  . Sulfa Antibiotics Swelling    ROS Review of Systems  Constitutional: Negative.   HENT: Negative.   Eyes: Negative.   Respiratory: Negative.    Cardiovascular: Negative.   Gastrointestinal: Negative.   Endocrine: Negative.   Genitourinary: Negative.   Musculoskeletal: Positive for joint swelling.  Skin: Positive for rash.       Facial rash  Allergic/Immunologic: Negative.   Neurological: Negative.   Hematological: Negative.   Psychiatric/Behavioral:       Currently on treatment  for bipolar disorder but would like to see specialist      Objective:    Physical Exam  Constitutional: She is oriented to person, place, and time. She appears well-developed and well-nourished.  She looks happy today  HENT:  Head: Normocephalic.  Pulmonary/Chest: Effort normal.  Musculoskeletal:     Comments: Joint stiffness adequate range of motion  Neurological: She is alert and oriented to person, place, and time.  Skin: Skin is warm and dry.  Psychiatric: She has a normal mood and affect. Her behavior is normal. Judgment and thought content normal.    BP (!) 142/95 (BP Location: Left Arm, Patient Position: Sitting, Cuff Size: Normal)   Pulse 78   Temp 98.7 F (37.1 C) (Oral)   Resp 16   Ht 5\' 4"  (1.626 m)   Wt 180 lb (81.6 kg)   LMP 11/07/2019   SpO2 100%   BMI 30.90 kg/m  Wt Readings from Last 3 Encounters:  11/15/19 180 lb (81.6 kg)  10/14/19 176 lb (79.8 kg)  04/10/19 162 lb 15.8 oz (73.9 kg)     Health Maintenance Due  Topic Date Due  . TETANUS/TDAP  10/26/2005  . PAP SMEAR-Modifier  10/27/2007  . INFLUENZA VACCINE  05/04/2019    There are no preventive care reminders to display for this patient.  No results found for: TSH Lab Results  Component Value Date   WBC 3.3 (L) 12/07/2018   HGB 13.4 12/07/2018   HCT 40.7 12/07/2018   MCV 90 12/07/2018   PLT 323 12/07/2018   Lab Results  Component Value Date   NA 140 10/14/2019   K 4.3 10/14/2019   CO2 26 03/06/2019   GLUCOSE 94 10/14/2019   BUN 13 10/14/2019   CREATININE 0.82 10/14/2019   BILITOT 0.3 10/14/2019   ALKPHOS 76 10/14/2019   AST 21 10/14/2019    ALT 25 03/06/2019   PROT 7.0 10/14/2019   ALBUMIN 4.7 10/14/2019   CALCIUM 9.8 10/14/2019   ANIONGAP 11 10/08/2018   No results found for: CHOL No results found for: HDL No results found for: LDLCALC No results found for: TRIG No results found for: CHOLHDL No results found for: 12/07/2018    Assessment & Plan:   Problem List Items Addressed This Visit      High   Bipolar affective disorder, current episode hypomanic (HCC)   Relevant Orders   Ambulatory referral to Psychiatry   SLE (systemic lupus erythematosus) (HCC)    Other Visit Diagnoses    Personal history of tobacco use, presenting hazards to health    -  Primary   Start Chantix as directed   Generalized joint pain       Pain management referral resent      Meds ordered this encounter  Medications  . varenicline (CHANTIX STARTING MONTH PAK) 0.5 MG X 11 & 1 MG X 42 tablet    Sig: Take one 0.5 mg tablet by mouth once daily for 3 days, then increase to one 0.5 mg tablet twice daily for 4 days, then increase to one 1 mg tablet twice daily.    Dispense:  53 tablet    Refill:  0    Order Specific Question:   Supervising Provider    Answer:   [id number] Quentin Angst  . acetaminophen-codeine (TYLENOL #3) 300-30 MG tablet    Sig: Take 1 tablet by mouth every 4 (four) hours as needed for up to 7 days for moderate pain.    Dispense:  30 tablet    Refill:  0    Order Specific Question:   Supervising Provider    Answer:   Quentin Angst [[id number]]    Follow-up: Return in about 3 months (around 02/12/2020).

## 2019-12-09 ENCOUNTER — Other Ambulatory Visit: Payer: Self-pay | Admitting: Nurse Practitioner

## 2019-12-09 NOTE — Telephone Encounter (Signed)
Refill request for Tylenol #3. Please advise.  

## 2019-12-09 NOTE — Telephone Encounter (Signed)
She was to get in with pain management The referral has been placed for the second time No additional refills Thank you

## 2019-12-12 MED FILL — ?OMEPRAZOLE 20MG CAP DR: 20 | 7 days supply | Qty: 30 | Fill #0

## 2019-12-12 MED FILL — lamoTRIgine 200 MG TABS: 200 | 30 days supply | Qty: 30 | Fill #1

## 2019-12-12 MED FILL — OLANZapine 20 MG TABS: 20 | 30 days supply | Qty: 30 | Fill #1

## 2019-12-13 ENCOUNTER — Telehealth: Payer: Self-pay | Admitting: Family Medicine

## 2019-12-13 ENCOUNTER — Telehealth: Payer: Self-pay | Admitting: Nurse Practitioner

## 2019-12-13 ENCOUNTER — Other Ambulatory Visit: Payer: Self-pay | Admitting: Nurse Practitioner

## 2019-12-13 DIAGNOSIS — F310 Bipolar disorder, current episode hypomanic: Secondary | ICD-10-CM

## 2019-12-13 MED ORDER — LAMOTRIGINE 200 MG PO TABS: 200.0000 mg | ORAL_TABLET | Freq: Every day | ORAL | 2 refills | Status: DC

## 2019-12-13 NOTE — Telephone Encounter (Signed)
Please make me aware which pharmacy will use that I need to send it.  Thanks

## 2019-12-13 NOTE — Telephone Encounter (Signed)
Called and spoke with patient, she says she needs to research and see which pharmacy will be cheaper with [facility] and will call us back. Thanks!

## 2019-12-13 NOTE — Telephone Encounter (Signed)
Error- sent to wrong provider

## 2019-12-16 ENCOUNTER — Telehealth: Payer: Self-pay | Admitting: Nurse Practitioner

## 2019-12-16 NOTE — Telephone Encounter (Signed)
This was sent in on 12/13/2019. Thanks!

## 2019-12-20 MED FILL — predniSONE 20 MG TABS: 20 | 30 days supply | Qty: 60 | Fill #1

## 2019-12-20 MED FILL — ?CLONIDINE HCL 0.2 MG TABS: 0.2 | 30 days supply | Qty: 30 | Fill #0

## 2019-12-20 MED FILL — ?HYDROXYCHLOROQUINE 200 MG: 200 | 30 days supply | Qty: 60 | Fill #1

## 2019-12-20 MED FILL — MONO-LINYAH 28 TABLET: 0.25-35 | 28 days supply | Qty: 28 | Fill #5

## 2020-01-24 ENCOUNTER — Other Ambulatory Visit: Payer: Self-pay | Admitting: Internal Medicine

## 2020-01-24 DIAGNOSIS — M329 Systemic lupus erythematosus, unspecified: Secondary | ICD-10-CM

## 2020-01-24 MED FILL — OLANZapine 20 MG TABS: 20 | 30 days supply | Qty: 30 | Fill #2

## 2020-01-24 MED FILL — ?CLONIDINE HCL 0.2 MG TABS: 0.2 | 30 days supply | Qty: 30 | Fill #1

## 2020-01-24 MED FILL — lamoTRIgine 200 MG TABS: 200 | 30 days supply | Qty: 30 | Fill #2

## 2020-01-24 MED FILL — MONO-LINYAH 28 TABLET: 0.25-35 | 28 days supply | Qty: 28 | Fill #6

## 2020-01-24 MED FILL — HYDROXYCHLOROQUINE SULFATE: 200 | 30 days supply | Qty: 60 | Fill #0

## 2020-02-12 ENCOUNTER — Ambulatory Visit: Payer: Medicaid Other | Admitting: Nurse Practitioner

## 2020-02-21 ENCOUNTER — Other Ambulatory Visit: Payer: Self-pay | Admitting: Internal Medicine

## 2020-02-21 ENCOUNTER — Telehealth: Payer: Self-pay | Admitting: Family Medicine

## 2020-02-21 DIAGNOSIS — F310 Bipolar disorder, current episode hypomanic: Secondary | ICD-10-CM

## 2020-02-21 MED FILL — MONO-LINYAH 28 TABLET: 0.25-35 | 28 days supply | Qty: 28 | Fill #7

## 2020-02-21 MED FILL — lamoTRIgine 200 MG TABS: 200 | 30 days supply | Qty: 30 | Fill #0

## 2020-02-21 NOTE — Telephone Encounter (Signed)
error 

## 2020-03-16 ENCOUNTER — Other Ambulatory Visit: Payer: Self-pay | Admitting: Family Medicine

## 2020-03-16 ENCOUNTER — Other Ambulatory Visit: Payer: Self-pay | Admitting: Internal Medicine

## 2020-03-16 MED FILL — ?HYDROXYCHLOROQUINE 200 MG: 200 | 30 days supply | Qty: 60 | Fill #1

## 2020-03-16 MED FILL — lamoTRIgine 200 MG TABS: 200 | 30 days supply | Qty: 30 | Fill #0

## 2020-03-16 NOTE — Telephone Encounter (Signed)
The original prescription was discontinued on 10/14/2019 by Loney Hering, LPN for the following reason: Completed Course. Renewing this prescription may not be appropriate.

## 2020-03-16 NOTE — Telephone Encounter (Signed)
Called pt and she agreed to an office visit. She will be scheduling an appt with scheduler

## 2020-03-16 NOTE — Telephone Encounter (Signed)
Please advise if this rx you would like to refill. According to records this was taken off her medication list in Jan. Did you want her to schedule an OV, she doesn't have any pending ovs

## 2020-03-16 NOTE — Telephone Encounter (Signed)
Yes she will need an apt  Thanks

## 2020-04-14 ENCOUNTER — Telehealth: Payer: Self-pay | Admitting: Nurse Practitioner

## 2020-04-14 ENCOUNTER — Other Ambulatory Visit: Payer: Self-pay | Admitting: Nurse Practitioner

## 2020-04-14 DIAGNOSIS — M329 Systemic lupus erythematosus, unspecified: Secondary | ICD-10-CM

## 2020-04-14 MED FILL — predniSONE 20 MG TABS: 20 | 30 days supply | Qty: 60 | Fill #0

## 2020-04-14 MED FILL — lamoTRIgine 200 MG TABS: 200 | 30 days supply | Qty: 30 | Fill #1

## 2020-04-14 MED FILL — OLANZapine 20 MG TABS: 20 | 30 days supply | Qty: 30 | Fill #0

## 2020-04-14 NOTE — Telephone Encounter (Signed)
Patient called and requested for a call from the financial advisor. Please follow up at your earliest conveneince.

## 2020-04-15 NOTE — Telephone Encounter (Signed)
I call the Pt since she ask me to, I LVM to call me back to see how I can help her

## 2020-04-20 MED FILL — ?HYDROXYCHLOROQUINE 200 MG: 200 | 30 days supply | Qty: 60 | Fill #0

## 2020-04-27 ENCOUNTER — Encounter: Payer: Self-pay | Admitting: Nurse Practitioner

## 2020-04-27 ENCOUNTER — Other Ambulatory Visit: Payer: Self-pay

## 2020-04-27 ENCOUNTER — Ambulatory Visit (INDEPENDENT_AMBULATORY_CARE_PROVIDER_SITE_OTHER): Payer: Self-pay | Admitting: Nurse Practitioner

## 2020-04-27 VITALS — BP 142/90 | HR 84 | Temp 98.6°F | Resp 16 | Ht 64.0 in | Wt 176.0 lb

## 2020-04-27 DIAGNOSIS — G4700 Insomnia, unspecified: Secondary | ICD-10-CM

## 2020-04-27 DIAGNOSIS — R030 Elevated blood-pressure reading, without diagnosis of hypertension: Secondary | ICD-10-CM

## 2020-04-27 DIAGNOSIS — M329 Systemic lupus erythematosus, unspecified: Secondary | ICD-10-CM

## 2020-04-27 DIAGNOSIS — Z0000 Encounter for general adult medical examination without abnormal findings: Secondary | ICD-10-CM

## 2020-04-27 DIAGNOSIS — F3112 Bipolar disorder, current episode manic without psychotic features, moderate: Secondary | ICD-10-CM

## 2020-04-27 MED ORDER — PREDNISONE 20 MG PO TABS: ORAL_TABLET | ORAL | 5 refills | Status: DC

## 2020-04-27 MED ORDER — FLUCONAZOLE 150 MG PO TABS: 150.0000 mg | ORAL_TABLET | Freq: Once | ORAL | 1 refills | Status: AC

## 2020-04-27 MED ORDER — CLOBETASOL PROPIONATE 0.05 % EX CREA: 1.0000 "application " | TOPICAL_CREAM | Freq: Two times a day (BID) | CUTANEOUS | 2 refills | Status: DC

## 2020-04-27 MED ORDER — ACETAMINOPHEN-CODEINE #3 300-30 MG PO TABS: 1.0000 | ORAL_TABLET | Freq: Three times a day (TID) | ORAL | 0 refills | Status: AC | PRN

## 2020-04-27 MED ORDER — POLYETHYLENE GLYCOL 3350 17 GM/SCOOP PO POWD: 17.0000 g | Freq: Two times a day (BID) | ORAL | 1 refills | Status: DC | PRN

## 2020-04-27 MED ORDER — LAMOTRIGINE 200 MG PO TABS: 200.0000 mg | ORAL_TABLET | Freq: Every day | ORAL | 5 refills | Status: DC

## 2020-04-27 MED ORDER — OLANZAPINE 20 MG PO TABS: 20.0000 mg | ORAL_TABLET | Freq: Every day | ORAL | 5 refills | Status: DC

## 2020-04-27 MED ORDER — HYDROXYCHLOROQUINE SULFATE 200 MG PO TABS: 400.0000 mg | ORAL_TABLET | Freq: Every day | ORAL | 5 refills | Status: DC

## 2020-04-27 MED ORDER — CLONIDINE HCL 0.2 MG PO TABS: 0.2000 mg | ORAL_TABLET | Freq: Every day | ORAL | 5 refills | Status: DC

## 2020-04-27 MED ORDER — OMEPRAZOLE 20 MG PO CPDR: DELAYED_RELEASE_CAPSULE | ORAL | 11 refills | Status: DC

## 2020-04-27 MED FILL — POLYETHYLENE GLYCOL 3350 PO: 17 | 7 days supply | Qty: 238 | Fill #0

## 2020-04-27 MED FILL — ?OMEPRAZOLE 20 MG CPDR: 20 | 30 days supply | Qty: 30 | Fill #0

## 2020-04-27 MED FILL — OLANZapine 20 MG TABS: 20 | 30 days supply | Qty: 30 | Fill #0

## 2020-04-27 MED FILL — FLUCONAZOLE 150 MG TABLET: 150 | 1 days supply | Qty: 1 | Fill #0

## 2020-04-27 MED FILL — CLOBETASOL PROPIONATE 0.05: 0.05 | 30 days supply | Qty: 60 | Fill #0

## 2020-04-27 NOTE — Progress Notes (Signed)
Integrated Behavioral Health Case Management Referral Note  04/27/2020 Name: Wendy Herman MRN: [id number] DOB: 1986-09-16 Wendy Herman is a 34 y.o. year old female who sees Barbette Merino, NP for primary care. LCSW was consulted to assess patient's needs and assist the patient with Financial Difficulties related to low income and lack of adequate health coverage.  Interpreter: No.   Interpreter Name & Language: none  Assessment: Patient experiencing Financial constraints related to low income and lack of adequate health coverage. Patient only has Medicaid family planning. She has applied for financial assistance at [facility] pharmacy. She is also in need of general financial assistance for medical care. Provided patient with [facility] financial assistance application and reviewed application and instructions. Patient to follow up once she has required supporting documents and CSW will assist in scheduling financial assistance appointment at [facility].  Review of patient status, including review of consultants reports, relevant laboratory and other test results, and collaboration with appropriate care team members and the patient's provider was performed as part of comprehensive patient evaluation and provision of services.    SDOH (Social Determinants of Health) assessments performed: No    Outpatient Encounter Medications as of 04/27/2020  Medication Sig  . Calcium Carb-Cholecalciferol (CALCIUM 600 + D PO) Take 1 tablet by mouth daily.  . clobetasol cream (TEMOVATE) 0.05 % Apply 1 application topically 2 (two) times daily.  . cloNIDine (CATAPRES) 0.2 MG tablet Take 1 tablet (0.2 mg total) by mouth at bedtime.  . hydroxychloroquine (PLAQUENIL) 200 MG tablet Take 2 tablets (400 mg total) by mouth daily.  Marland Kitchen lamoTRIgine (LAMICTAL) 200 MG tablet Take 1 tablet (200 mg total) by mouth daily.  Marland Kitchen OLANZapine (ZYPREXA) 20 MG tablet Take 1 tablet (20 mg total) by mouth at bedtime. Must have appt for more refills.   Marland Kitchen omeprazole (PRILOSEC) 20 MG capsule TAKE 2 CAPSULES BY MOUTH TWICE DAILY AS NEEDED FOR ACID REFLUX.  Marland Kitchen polyethylene glycol powder (GLYCOLAX/MIRALAX) 17 GM/SCOOP powder Take 17 g by mouth 2 (two) times daily as needed.  . predniSONE (DELTASONE) 20 MG tablet TAKE 1 TABLET BY MOUTH TWICE DAILY WITH A MEAL.  . [DISCONTINUED] clobetasol cream (TEMOVATE) 0.05 % Apply 1 application topically 2 (two) times daily.  . [DISCONTINUED] cloNIDine (CATAPRES) 0.2 MG tablet Take 1 tablet (0.2 mg total) by mouth at bedtime.  . [DISCONTINUED] hydroxychloroquine (PLAQUENIL) 200 MG tablet TAKE 2 TABLETS BY MOUTH DAILY.  . [DISCONTINUED] lamoTRIgine (LAMICTAL) 200 MG tablet TAKE 1 TABLET BY MOUTH DAILY.  . [DISCONTINUED] OLANZapine (ZYPREXA) 20 MG tablet Take 1 tablet (20 mg total) by mouth at bedtime. Must have appt for more refills.  . [DISCONTINUED] omeprazole (PRILOSEC) 20 MG capsule TAKE 2 CAPSULES BY MOUTH TWICE DAILY AS NEEDED FOR ACID REFLUX.  . [DISCONTINUED] polyethylene glycol powder (GLYCOLAX/MIRALAX) 17 GM/SCOOP powder Take 17 g by mouth 2 (two) times daily as needed.  . [DISCONTINUED] predniSONE (DELTASONE) 20 MG tablet TAKE 1 TABLET BY MOUTH TWICE DAILY WITH A MEAL.  Marland Kitchen acetaminophen-codeine (TYLENOL #3) 300-30 MG tablet Take 1 tablet by mouth every 8 (eight) hours as needed for up to 7 days for moderate pain.  . fluconazole (DIFLUCAN) 150 MG tablet Take 1 tablet (150 mg total) by mouth once for 1 dose.  . [DISCONTINUED] diphenhydrAMINE (BENADRYL) 25 mg capsule Take 200 mg by mouth at bedtime as needed for sleep. (Patient not taking: Reported on 04/27/2020)  . [DISCONTINUED] gabapentin (NEURONTIN) 100 MG capsule Take 1 capsule (100 mg total) by mouth 3 (  three) times daily. (Patient not taking: Reported on 11/15/2019)  . [DISCONTINUED] guanFACINE (TENEX) 1 MG tablet Take 1 tablet (1 mg total) by mouth at bedtime. (Patient not taking: Reported on 10/14/2019)  . [DISCONTINUED] traMADol (ULTRAM) 50 MG tablet  TAKE 1 TABLET BY MOUTH EVERY 8 HOURS AS NEEDED FOR UP TO 10 DAYS FOR MODERATE PAIN. (Patient not taking: Reported on 04/27/2020)  . [DISCONTINUED] varenicline (CHANTIX STARTING MONTH PAK) 0.5 MG X 11 & 1 MG X 42 tablet Take one 0.5 mg tablet by mouth once daily for 3 days, then increase to one 0.5 mg tablet twice daily for 4 days, then increase to one 1 mg tablet twice daily. (Patient not taking: Reported on 04/27/2020)   No facility-administered encounter medications on file as of 04/27/2020.    Goals Addressed   None      Follow up Plan: 1. Patient to gather required supporting documents for application 2. CSW to assist with financial assistance appointment at [facility]  Abigail Butts, LCSW [facility] [phone]

## 2020-04-27 NOTE — Progress Notes (Signed)
[facility] Patient [facility] [address] [place], [place]  [address] Phone:  [phone]   Fax:  [phone]     Established Patient Office Visit  Subjective:  Patient ID: Wendy Herman, female    DOB: 05-13-1986  Age: 34 y.o. MRN: [id number]  CC:  Chief Complaint  Patient presents with  . Follow-up    med refills     HPI Wendy Herman presents for follow up. She  has a past medical history of Arthritis, Chronic pain, Fibromyalgia, Lupus (HCC), and Neutropenia (HCC).    Lupus Patient here for follow up of  SLE. Symptoms have been present for several years. Onset was at the age of 73.  Symptoms include malar rash, arthritis and malaise and are of moderate and severe severity.  Symptoms are made worse by: movement.  Symptoms are helped by arthritis medications, rest and APAP#3.  Associated symptoms include arthralgia, insomnia, joint pain, morning stiffness and rashes/photosensitive. Patient denies associated bleeding/clotting problems, fevers, nodules, oral ulcers, palpitations, pleurisy, polydypsia and polyuria. Patient is currently taking the following medications associate with SLE type syndrome: hydroxychloroquine and prednisone. She want to get off the prednisone . She feels like it is causing her to have round face and hump on her back. She is currently taking Prednisone 20mg  qd vs prescribed 20mg  BID.   She is currently being treated for Bipolar. She is on Zyprexa and Lamictal. She admits that she has been in a period of mania. She has not been following up with any specialist due to her lack of insurance. She lives in [facility] and this causes her another problem with access to care. She is currently working at and she likes to work. This is when she noticed that she was unable to calm herself down while working and at night.  She wonders in the mania is wondering if the Lamictal needs to be adjusted.     Past Medical History:  Diagnosis Date  .  Arthritis   . Chronic pain   . Fibromyalgia   . Lupus (HCC)   . Neutropenia [facility])     Past Surgical History:  Procedure Laterality Date  . CESAREAN SECTION      Family History  Problem Relation Age of Onset  . Hypertension Mother   . Heart attack Father        In his 43s. Died from it.  . Rheum arthritis Maternal Grandmother   . Lupus Maternal Grandmother   . Diabetes Maternal Grandmother   . Heart disease Maternal Grandfather   . Diabetes Maternal Grandfather   . Breast cancer Maternal Aunt   . Pancreatic cancer Maternal Uncle   . Diabetes Maternal Aunt   . Diabetes Maternal Aunt     Social History   Socioeconomic History  . Marital status: Divorced    Spouse name: Not on file  . Number of children: Not on file  . Years of education: Not on file  . Highest education level: Not on file  Occupational History  . Not on file  Tobacco Use  . Smoking status: Former [facility]  . Smokeless tobacco: Never Used  Vaping Use  . Vaping Use: Never used  Substance and Sexual Activity  . Alcohol use: No  . Drug use: No  . Sexual activity: Never  Other Topics Concern  . Not on file  Social History Narrative  . Not on file   Social Determinants of Health   Financial Resource  Strain:   . Difficulty of Paying Living Expenses:   Food Insecurity:   . Worried About Programme researcher, broadcasting/film/video in the Last Year:   . Barista in the Last Year:   Transportation Needs:   . Freight forwarder (Medical):   Marland Kitchen Lack of Transportation (Non-Medical):   Physical Activity:   . Days of Exercise per Week:   . Minutes of Exercise per Session:   Stress:   . Feeling of Stress :   Social Connections:   . Frequency of Communication with Friends and Family:   . Frequency of Social Gatherings with Friends and Family:   . Attends Religious Services:   . Active Member of Clubs or Organizations:   . Attends Banker Meetings:   Marland Kitchen Marital Status:   Intimate Partner Violence:   .  Fear of Current or Ex-Partner:   . Emotionally Abused:   Marland Kitchen Physically Abused:   . Sexually Abused:     Outpatient Medications Prior to Visit  Medication Sig Dispense Refill  . Calcium Carb-Cholecalciferol (CALCIUM 600 + D PO) Take 1 tablet by mouth daily.    . clobetasol cream (TEMOVATE) 0.05 % Apply 1 application topically 2 (two) times daily. 60 g 2  . cloNIDine (CATAPRES) 0.2 MG tablet Take 1 tablet (0.2 mg total) by mouth at bedtime. 30 tablet 2  . hydroxychloroquine (PLAQUENIL) 200 MG tablet TAKE 2 TABLETS BY MOUTH DAILY. 60 tablet 1  . lamoTRIgine (LAMICTAL) 200 MG tablet TAKE 1 TABLET BY MOUTH DAILY. 30 tablet 2  . OLANZapine (ZYPREXA) 20 MG tablet Take 1 tablet (20 mg total) by mouth at bedtime. Must have appt for more refills. 30 tablet 2  . omeprazole (PRILOSEC) 20 MG capsule TAKE 2 CAPSULES BY MOUTH TWICE DAILY AS NEEDED FOR ACID REFLUX. 30 capsule 0  . polyethylene glycol powder (GLYCOLAX/MIRALAX) 17 GM/SCOOP powder Take 17 g by mouth 2 (two) times daily as needed. 3350 g 1  . predniSONE (DELTASONE) 20 MG tablet TAKE 1 TABLET BY MOUTH TWICE DAILY WITH A MEAL. 60 tablet 1  . diphenhydrAMINE (BENADRYL) 25 mg capsule Take 200 mg by mouth at bedtime as needed for sleep. (Patient not taking: Reported on 04/27/2020)    . gabapentin (NEURONTIN) 100 MG capsule Take 1 capsule (100 mg total) by mouth 3 (three) times daily. (Patient not taking: Reported on 11/15/2019) 90 capsule 0  . guanFACINE (TENEX) 1 MG tablet Take 1 tablet (1 mg total) by mouth at bedtime. (Patient not taking: Reported on 10/14/2019) 30 tablet 2  . traMADol (ULTRAM) 50 MG tablet TAKE 1 TABLET BY MOUTH EVERY 8 HOURS AS NEEDED FOR UP TO 10 DAYS FOR MODERATE PAIN. (Patient not taking: Reported on 04/27/2020) 30 tablet 0  . varenicline (CHANTIX STARTING MONTH PAK) 0.5 MG X 11 & 1 MG X 42 tablet Take one 0.5 mg tablet by mouth once daily for 3 days, then increase to one 0.5 mg tablet twice daily for 4 days, then increase to one 1  mg tablet twice daily. (Patient not taking: Reported on 04/27/2020) 53 tablet 0   No facility-administered medications prior to visit.    Allergies  Allergen Reactions  . Codeine Swelling  . Nsaids Other (See Comments)    Interacts with GERD  . Sulfa Antibiotics Swelling    ROS Review of Systems    Objective:    Physical Exam HENT:     Head: Normocephalic and atraumatic.     Nose: Nose  normal.     Mouth/Throat:     Mouth: Mucous membranes are moist.  Cardiovascular:     Rate and Rhythm: Normal rate and regular rhythm.     Pulses: Normal pulses.     Heart sounds: Normal heart sounds.  Pulmonary:     Effort: Pulmonary effort is normal.  Abdominal:     Palpations: Abdomen is soft.  Musculoskeletal:        General: Tenderness present.     Cervical back: Normal range of motion.  Skin:    General: Skin is warm and dry.     Capillary Refill: Capillary refill takes less than 2 seconds.  Neurological:     General: No focal deficit present.     Mental Status: She is alert and oriented to person, place, and time.  Psychiatric:        Thought Content: Thought content normal.     Comments: Talkative Ringing her hands due to pain     BP (!) 142/90 (BP Location: Right Arm, Patient Position: Sitting, Cuff Size: Normal)   Pulse 84   Temp 98.6 F (37 C) (Oral)   Resp 16   Ht 5\' 4"  (1.626 m)   Wt 176 lb (79.8 kg)   LMP 04/24/2020   SpO2 99%   BMI 30.21 kg/m  Wt Readings from Last 3 Encounters:  04/27/20 176 lb (79.8 kg)  11/15/19 180 lb (81.6 kg)  10/14/19 176 lb (79.8 kg)     There are no preventive care reminders to display for this patient.  There are no preventive care reminders to display for this patient.  No results found for: TSH Lab Results  Component Value Date   WBC 3.3 (L) 12/07/2018   HGB 13.4 12/07/2018   HCT 40.7 12/07/2018   MCV 90 12/07/2018   PLT 323 12/07/2018   Lab Results  Component Value Date   NA 140 10/14/2019   K 4.3 10/14/2019    CO2 26 03/06/2019   GLUCOSE 94 10/14/2019   BUN 13 10/14/2019   CREATININE 0.82 10/14/2019   BILITOT 0.3 10/14/2019   ALKPHOS 76 10/14/2019   AST 21 10/14/2019   ALT 25 03/06/2019   PROT 7.0 10/14/2019   ALBUMIN 4.7 10/14/2019   CALCIUM 9.8 10/14/2019   ANIONGAP 11 10/08/2018   No results found for: CHOL No results found for: HDL No results found for: LDLCALC No results found for: TRIG No results found for: CHOLHDL No results found for: [id number]    Assessment & Plan:   Problem List Items Addressed This Visit      Other   Bipolar 1 disorder with moderate mania (HCC) No current changes in regimen. Will have social worker to see if she can help with assistance so she can have more access to care.    Relevant Medications   lamoTRIgine (LAMICTAL) 200 MG tablet   OLANZapine (ZYPREXA) 20 MG tablet   Other Relevant Orders   Ambulatory referral to Psychiatry   SLE (systemic lupus erythematosus) (HCC) - Primary Will continue with current regimen.  She is unsure if trying Prednisone 15mg  daily vs 20mg  daily may not be effective .  If patient qualifies for the orange card additional referral to external rheumatology will allow more access Refill on APAP #3 to helps with underlying pain. Failed Tramadol in the past   Relevant Medications   clobetasol cream (TEMOVATE) 0.05 %   hydroxychloroquine (PLAQUENIL) 200 MG tablet   Other Relevant Orders   Comp. Metabolic Panel (  12)    Other Visit Diagnoses    Elevated blood pressure reading       Relevant Medications   cloNIDine (CATAPRES) 0.2 MG tablet Encourage home monitoring and recording BP <130/80 Eating a heart-healthy dies with less salt Encouraged regular physical activity  Recommend Weight loss Encouraged on going compliance with current medication regimen    Insomnia, unspecified type       Relevant Medications   cloNIDine (CATAPRES) 0.2 MG tablet   Healthcare maintenance       Relevant Orders   Hepatitis C antibody       Meds ordered this encounter  Medications  . fluconazole (DIFLUCAN) 150 MG tablet    Sig: Take 1 tablet (150 mg total) by mouth once for 1 dose.    Dispense:  1 tablet    Refill:  1    Order Specific Question:   Supervising Provider    Answer:   Quentin Angst [id number]  . clobetasol cream (TEMOVATE) 0.05 %    Sig: Apply 1 application topically 2 (two) times daily.    Dispense:  60 g    Refill:  2    Order Specific Question:   Supervising Provider    Answer:   Quentin Angst [id number]  . cloNIDine (CATAPRES) 0.2 MG tablet    Sig: Take 1 tablet (0.2 mg total) by mouth at bedtime.    Dispense:  30 tablet    Refill:  5    Order Specific Question:   Supervising Provider    Answer:   Quentin Angst [id number]  . hydroxychloroquine (PLAQUENIL) 200 MG tablet    Sig: Take 2 tablets (400 mg total) by mouth daily.    Dispense:  60 tablet    Refill:  5    Order Specific Question:   Supervising Provider    Answer:   Quentin Angst [id number]  . lamoTRIgine (LAMICTAL) 200 MG tablet    Sig: Take 1 tablet (200 mg total) by mouth daily.    Dispense:  30 tablet    Refill:  5    Order Specific Question:   Supervising Provider    Answer:   Quentin Angst [id number]  . OLANZapine (ZYPREXA) 20 MG tablet    Sig: Take 1 tablet (20 mg total) by mouth at bedtime. Must have appt for more refills.    Dispense:  30 tablet    Refill:  5    Order Specific Question:   Supervising Provider    Answer:   Quentin Angst [id number]  . omeprazole (PRILOSEC) 20 MG capsule    Sig: TAKE 2 CAPSULES BY MOUTH TWICE DAILY AS NEEDED FOR ACID REFLUX.    Dispense:  30 capsule    Refill:  11    Order Specific Question:   Supervising Provider    Answer:   Quentin Angst [id number]  . predniSONE (DELTASONE) 20 MG tablet    Sig: TAKE 1 TABLET BY MOUTH TWICE DAILY WITH A MEAL.    Dispense:  60 tablet    Refill:  5    Order Specific Question:   Supervising Provider    Answer:    Quentin Angst [id number]  . polyethylene glycol powder (GLYCOLAX/MIRALAX) 17 GM/SCOOP powder    Sig: Take 17 g by mouth 2 (two) times daily as needed.    Dispense:  3350 g    Refill:  1    Order Specific Question:   Supervising Provider    Answer:  JEGEDE, OLUGBEMIGA E [id number]  . acetaminophen-codeine (TYLENOL #3) 300-30 MG tablet    Sig: Take 1 tablet by mouth every 8 (eight) hours as needed for up to 7 days for moderate pain.    Dispense:  30 tablet    Refill:  0    Order Specific Question:   Supervising Provider    Answer:   Quentin Angst [id number]    Follow-up: No follow-ups on file.    Wendy Merino, NP

## 2020-04-28 MED FILL — cloNIDine HCL 0.2 MG TABS: 0.2 | 30 days supply | Qty: 30 | Fill #0

## 2020-05-05 ENCOUNTER — Telehealth (HOSPITAL_COMMUNITY): Payer: Self-pay | Admitting: Psychiatry

## 2020-05-05 NOTE — Telephone Encounter (Signed)
Called to schedule NP from referral, left voicemail

## 2020-05-25 ENCOUNTER — Telehealth: Payer: Self-pay | Admitting: Nurse Practitioner

## 2020-05-25 ENCOUNTER — Telehealth (HOSPITAL_COMMUNITY): Payer: Self-pay | Admitting: *Deleted

## 2020-05-25 NOTE — Telephone Encounter (Signed)
Requesting a call back pertaining to referral. Has called pt twice. If pt does not call back sept 3rd, will need new referral

## 2020-05-25 NOTE — Telephone Encounter (Signed)
Can you please reach out to [name] and let her know about this Psy referral. Please inform her that it  very important that she allow them to help manage her care.

## 2020-05-25 NOTE — Telephone Encounter (Signed)
New referral received from [facility] for office to sch new pt appt. Staff called number on file and was not able to reach patient and LMOM. Staff have also tried reaching patient on 05-05-20 and LMOM. Staff called place that sent the referral and informed Orlene Plum with this information

## 2020-05-26 ENCOUNTER — Telehealth (HOSPITAL_COMMUNITY): Payer: Self-pay | Admitting: *Deleted

## 2020-05-26 NOTE — Telephone Encounter (Signed)
New pt referral received for patient to sch appt and staff called number on file and LMOM. Messages for patient to call office was 05-05-2020, 05-25-2020 and 05-26-2020.

## 2020-06-04 NOTE — Telephone Encounter (Signed)
Called lvm checking on status of physical therapy

## 2020-06-10 ENCOUNTER — Ambulatory Visit: Payer: Self-pay | Admitting: Nurse Practitioner

## 2020-06-12 ENCOUNTER — Ambulatory Visit: Payer: Self-pay | Admitting: Nurse Practitioner

## 2020-07-30 ENCOUNTER — Ambulatory Visit: Payer: Self-pay | Admitting: Nurse Practitioner

## 2020-07-31 ENCOUNTER — Ambulatory Visit: Payer: Self-pay | Admitting: Nurse Practitioner

## 2020-08-05 ENCOUNTER — Ambulatory Visit: Payer: Self-pay | Admitting: Nurse Practitioner

## 2020-08-05 MED FILL — predniSONE 20 MG TABS: 20 | 30 days supply | Qty: 60 | Fill #0

## 2020-08-05 MED FILL — CLOBETASOL PROPIONATE 0.05: 0.05 | 30 days supply | Qty: 60 | Fill #0

## 2020-08-05 MED FILL — ?HYDROXYCHLOROQUINE 200 MG: 200 | 30 days supply | Qty: 60 | Fill #0

## 2020-08-05 MED FILL — ?OMEPRAZOLE 20 MG CPDR: 20 | 30 days supply | Qty: 60 | Fill #0

## 2020-08-05 MED FILL — OLANZapine 20 MG TABS: 20 | 30 days supply | Qty: 30 | Fill #0

## 2020-08-05 MED FILL — lamoTRIgine 200 MG TABS: 200 | 30 days supply | Qty: 30 | Fill #2

## 2020-08-05 MED FILL — cloNIDine HCL 0.2 MG TABS: 0.2 | 30 days supply | Qty: 30 | Fill #0

## 2020-08-19 ENCOUNTER — Telehealth (HOSPITAL_COMMUNITY): Payer: Self-pay | Admitting: Psychiatry

## 2020-08-19 ENCOUNTER — Telehealth (INDEPENDENT_AMBULATORY_CARE_PROVIDER_SITE_OTHER): Payer: Self-pay | Admitting: Nurse Practitioner

## 2020-08-19 ENCOUNTER — Other Ambulatory Visit: Payer: Medicaid Other

## 2020-08-19 ENCOUNTER — Other Ambulatory Visit: Payer: Self-pay | Admitting: Internal Medicine

## 2020-08-19 DIAGNOSIS — Z20822 Contact with and (suspected) exposure to covid-19: Secondary | ICD-10-CM

## 2020-08-19 DIAGNOSIS — K047 Periapical abscess without sinus: Secondary | ICD-10-CM

## 2020-08-19 MED ORDER — AMOXICILLIN-POT CLAVULANATE 875-125 MG PO TABS
1.0000 | ORAL_TABLET | Freq: Two times a day (BID) | ORAL | 0 refills | Status: DC
Start: 2020-08-19 — End: 2020-09-04

## 2020-08-19 MED ORDER — FLUCONAZOLE 150 MG PO TABS
150.0000 mg | ORAL_TABLET | Freq: Once | ORAL | 0 refills | Status: AC
Start: 2020-08-19 — End: 2020-08-19

## 2020-08-19 NOTE — Progress Notes (Signed)
   [facility] Patient [facility] [address] [place], [place]  [address] Phone:  [phone]   Fax:  [phone] Virtual Visit via Telephone Note  I connected with Wendy Herman on 08/19/20 at 11:00 AM EST by telephone and verified that I am speaking with the correct person using two identifiers.   I discussed the limitations, risks, security and privacy concerns of performing an evaluation and management service by telephone and the availability of in person appointments. I also discussed with the patient that there may be a patient responsible charge related to this service. The patient expressed understanding and agreed to proceed.  Patient home Provider Office  History of Present Illness: Left side jaw pain the swelling related to tooth caries. She is having head pain and not being able to sleep related to the tooh pain. She denies, dizziness, visual changes, shortness of breath, dyspnea on exertion, chest pain, nausea, vomiting or any edema. She denies any new symptoms.    Observations/Objective: No exam due to virtual visit  Assessment and Plan: Assessment  Primary Diagnosis & Pertinent Problem List: The encounter diagnosis was Dental abscess.  Visit Diagnosis: 1. Dental abscess  Augmentin 875 #20 1 tablet twice daily Prophylactic Diflucan 150 mg x 1    Follow Up Instructions: Encourage pt to work on insurance so that she can be evaluated by rheumatology now that they have an additional provider.  She verbalized understanding and will work on this.   I discussed the assessment and treatment plan with the patient. The patient was provided an opportunity to ask questions and all were answered. The patient agreed with the plan and demonstrated an understanding of the instructions.   The patient was advised to call back or seek an in-person evaluation if the symptoms worsen or if the condition fails to improve as anticipated.  I provided 5 minutes of non-face-to-face  time during this encounter.   Barbette Merino, NP

## 2020-08-20 LAB — SARS-COV-2, NAA 2 DAY TAT

## 2020-08-20 LAB — SPECIMEN STATUS REPORT

## 2020-08-20 LAB — NOVEL CORONAVIRUS, NAA: SARS-CoV-2, NAA: NOT DETECTED

## 2020-09-03 ENCOUNTER — Other Ambulatory Visit: Payer: Self-pay | Admitting: Nurse Practitioner

## 2020-09-03 ENCOUNTER — Other Ambulatory Visit: Payer: Self-pay

## 2020-09-03 ENCOUNTER — Ambulatory Visit (INDEPENDENT_AMBULATORY_CARE_PROVIDER_SITE_OTHER): Payer: Self-pay | Admitting: Nurse Practitioner

## 2020-09-03 ENCOUNTER — Encounter: Payer: Self-pay | Admitting: Nurse Practitioner

## 2020-09-03 VITALS — BP 154/84 | HR 90 | Temp 98.3°F | Resp 16 | Ht 63.0 in | Wt 137.2 lb

## 2020-09-03 DIAGNOSIS — R102 Pelvic and perineal pain: Secondary | ICD-10-CM

## 2020-09-03 DIAGNOSIS — R634 Abnormal weight loss: Secondary | ICD-10-CM | POA: Diagnosis not present

## 2020-09-03 DIAGNOSIS — F3112 Bipolar disorder, current episode manic without psychotic features, moderate: Secondary | ICD-10-CM

## 2020-09-03 DIAGNOSIS — Z3201 Encounter for pregnancy test, result positive: Secondary | ICD-10-CM

## 2020-09-03 DIAGNOSIS — M329 Systemic lupus erythematosus, unspecified: Secondary | ICD-10-CM

## 2020-09-03 DIAGNOSIS — N926 Irregular menstruation, unspecified: Secondary | ICD-10-CM

## 2020-09-03 LAB — POCT URINE PREGNANCY: Preg Test, Ur: POSITIVE — AB

## 2020-09-03 LAB — POCT GLYCOSYLATED HEMOGLOBIN (HGB A1C): Hemoglobin A1C: 5.6 % (ref 4.0–5.6)

## 2020-09-03 LAB — POCT URINALYSIS DIP (CLINITEK)
Bilirubin, UA: NEGATIVE
Blood, UA: NEGATIVE
Glucose, UA: NEGATIVE mg/dL
Ketones, POC UA: NEGATIVE mg/dL
Leukocytes, UA: NEGATIVE
Nitrite, UA: NEGATIVE
POC PROTEIN,UA: 30 — AB
Spec Grav, UA: >=1.03 — AB (ref 1.010–1.025)
Urobilinogen, UA: 0.2 E.U./dL
pH, UA: 7 (ref 5.0–8.0)

## 2020-09-03 LAB — GLUCOSE, POCT (MANUAL RESULT ENTRY): POC Glucose: 100 mg/dl — AB (ref 70–99)

## 2020-09-03 MED ORDER — PRENATAL VITAMIN/MIN +DHA 27-0.8-200 MG PO CAPS: 1.0000 | ORAL_CAPSULE | Freq: Every day | ORAL | 3 refills | Status: DC

## 2020-09-03 NOTE — Progress Notes (Signed)
Severe pain to R pelvic area x 1 wk, concerned because she is pregnant, requesting U/S  -Currently taking suboxone, goes to clinic few times weekly  -Requesting script for prenatal vitamins

## 2020-09-03 NOTE — Patient Instructions (Signed)
Eating Plan for Pregnant Women While you are pregnant, your body requires additional nutrition to help support your growing baby. You also have a higher need for some vitamins and minerals, such as folic acid, calcium, iron, and vitamin D. Eating a healthy, well-balanced diet is very important for your health and your baby's health. Your need for extra calories varies for the three 3-month segments of your pregnancy (trimesters). For most women, it is recommended to consume:  150 extra calories a day during the first trimester.  300 extra calories a day during the second trimester.  300 extra calories a day during the third trimester. What are tips for following this plan?   Do not try to lose weight or go on a diet during pregnancy.  Limit your overall intake of foods that have "empty calories." These are foods that have little nutritional value, such as sweets, desserts, candies, and sugar-sweetened beverages.  Eat a variety of foods (especially fruits and vegetables) to get a full range of vitamins and minerals.  Take a prenatal vitamin to help meet your additional vitamin and mineral needs during pregnancy, specifically for folic acid, iron, calcium, and vitamin D.  Remember to stay active. Ask your health care provider what types of exercise and activities are safe for you.  Practice good food safety and cleanliness. Wash your hands before you eat and after you prepare raw meat. Wash all fruits and vegetables well before peeling or eating. Taking these actions can help to prevent food-borne illnesses that can be very dangerous to your baby, such as listeriosis. Ask your health care provider for more information about listeriosis. What does 150 extra calories look like? Healthy options that provide 150 extra calories each day could be any of the following:  6-8 oz (170-230 g) of plain low-fat yogurt with  cup of berries.  1 apple with 2 teaspoons (11 g) of peanut butter.  Cut-up  vegetables with  cup (60 g) of hummus.  8 oz (230 mL) or 1 cup of low-fat chocolate milk.  1 stick of string cheese with 1 medium orange.  1 peanut butter and jelly sandwich that is made with one slice of whole-wheat bread and 1 tsp (5 g) of peanut butter. For 300 extra calories, you could eat two of those healthy options each day. What is a healthy amount of weight to gain? The right amount of weight gain for you is based on your BMI before you became pregnant. If your BMI:  Was less than 18 (underweight), you should gain 28-40 lb (13-18 kg).  Was 18-24.9 (normal), you should gain 25-35 lb (11-16 kg).  Was 25-29.9 (overweight), you should gain 15-25 lb (7-11 kg).  Was 30 or greater (obese), you should gain 11-20 lb (5-9 kg). What if I am having twins or multiples? Generally, if you are carrying twins or multiples:  You may need to eat 300-600 extra calories a day.  The recommended range for total weight gain is 25-54 lb (11-25 kg), depending on your BMI before pregnancy.  Talk with your health care provider to find out about nutritional needs, weight gain, and exercise that is right for you. What foods can I eat?  Fruits All fruits. Eat a variety of colors and types of fruit. Remember to wash your fruits well before peeling or eating. Vegetables All vegetables. Eat a variety of colors and types of vegetables. Remember to wash your vegetables well before peeling or eating. Grains All grains. Choose whole grains, such   as whole-wheat bread, oatmeal, or brown rice. Meats and other protein foods Lean meats, including chicken, turkey, fish, and lean cuts of beef, veal, or pork. If you eat fish or seafood, choose options that are higher in omega-3 fatty acids and lower in mercury, such as salmon, herring, mussels, trout, sardines, pollock, shrimp, crab, and lobster. Tofu. Tempeh. Beans. Eggs. Peanut butter and other nut butters. Make sure that all meats, poultry, and eggs are cooked to  food-safe temperatures or "well-done." Two or more servings of fish are recommended each week in order to get the most benefits from omega-3 fatty acids that are found in seafood. Choose fish that are lower in mercury. You can find more information online:  www.fda.gov Dairy Pasteurized milk and milk alternatives (such as almond milk). Pasteurized yogurt and pasteurized cheese. Cottage cheese. Sour cream. Beverages Water. Juices that contain 100% fruit juice or vegetable juice. Caffeine-free teas and decaffeinated coffee. Drinks that contain caffeine are okay to drink, but it is better to avoid caffeine. Keep your total caffeine intake to less than 200 mg each day (which is 12 oz or 355 mL of coffee, tea, or soda) or the limit as told by your health care provider. Fats and oils Fats and oils are okay to include in moderation. Sweets and desserts Sweets and desserts are okay to include in moderation. Seasoning and other foods All pasteurized condiments. The items listed above may not be a complete list of foods and beverages you can eat. Contact a dietitian for more information. What foods are not recommended? Fruits Unpasteurized fruit juices. Vegetables Raw (unpasteurized) vegetable juices. Meats and other protein foods Lunch meats, bologna, hot dogs, or other deli meats. (If you must eat those meats, reheat them until they are steaming hot.) Refrigerated pat, meat spreads from a meat counter, smoked seafood that is found in the refrigerated section of a store. Raw or undercooked meats, poultry, and eggs. Raw fish, such as sushi or sashimi. Fish that have high mercury content, such as tilefish, shark, swordfish, and Marcia Lepera mackerel. To learn more about mercury in fish, talk with your health care provider or look for online resources, such as:  www.fda.gov Dairy Raw (unpasteurized) milk and any foods that have raw milk in them. Soft cheeses, such as feta, queso blanco, queso fresco, Brie,  Camembert cheeses, blue-veined cheeses, and Panela cheese (unless it is made with pasteurized milk, which must be stated on the label). Beverages Alcohol. Sugar-sweetened beverages, such as sodas, teas, or energy drinks. Seasoning and other foods Homemade fermented foods and drinks, such as pickles, sauerkraut, or kombucha drinks. (Store-bought pasteurized versions of these are okay.) Salads that are made in a store or deli, such as ham salad, chicken salad, egg salad, tuna salad, and seafood salad. The items listed above may not be a complete list of foods and beverages you should avoid. Contact a dietitian for more information. Where to find more information To calculate the number of calories you need based on your height, weight, and activity level, you can use an online calculator such as:  www.choosemyplate.gov/MyPlatePlan To calculate how much weight you should gain during pregnancy, you can use an online pregnancy weight gain calculator such as:  www.choosemyplate.gov/pregnancy-weight-gain-calculator Summary  While you are pregnant, your body requires additional nutrition to help support your growing baby.  Eat a variety of foods, especially fruits and vegetables to get a full range of vitamins and minerals.  Practice good food safety and cleanliness. Wash your hands before you eat   and after you prepare raw meat. Wash all fruits and vegetables well before peeling or eating. Taking these actions can help to prevent food-borne illnesses, such as listeriosis, that can be very dangerous to your baby.  Do not eat raw meat or fish. Do not eat fish that have high mercury content, such as tilefish, shark, swordfish, and Kayle Passarelli mackerel. Do not eat unpasteurized (raw) dairy.  Take a prenatal vitamin to help meet your additional vitamin and mineral needs during pregnancy, specifically for folic acid, iron, calcium, and vitamin D. This information is not intended to replace advice given to you by  your health care provider. Make sure you discuss any questions you have with your health care provider. Document Revised: 02/07/2019 Document Reviewed: 06/16/2017 Elsevier Patient Education  2020 [employer]  

## 2020-09-03 NOTE — Progress Notes (Signed)
[facility] Patient [facility] [address] [place], [place]  [address] Phone:  [phone]   Fax:  [phone]   Established Patient Office Visit  Subjective:  Patient ID: Wendy Herman, female    DOB: 07/20/1986  Age: 34 y.o. MRN: [id number]  CC:  Chief Complaint  Patient presents with  . Follow-up    HPI Wendy Herman presents for follow up. She  has a past medical history of Arthritis, Chronic pain, Fibromyalgia, Lupus (HCC), and Neutropenia (HCC).   Missed Menses/ Possible Pregnancy Patient complains of amenorrhea. She believes she could be pregnant. Pregnancy is desired. Sexual Activity: single partner, contraception: none. Current symptoms also include: positive home pregnancy test. Last period was 07/29/2020.   She is also here for discussion regarding unexplained/excessive weight loss. Onset was a few months ago. Patient has lost approximately 40 pounds in last 5 months. . History of eating disorders: none.  Factors associated with weight loss: poor appetite and palpattions with chest discomfort however contributed this to her anxiety. . Patient denies: binge/ purge behaviors, constipation, diarrhea, heat intolerance and intentional calorie restriction. She had made some dietary changes initially cutting carb however the weight just continued to decline. She has resumed eating but she is now having to force herself to eat.    She admits that she h as Suboxone. She has taken a few doses. She admits this was because she was unable to get pain medication for her lupus pain. She did not have secure insurance [place] so she has had some additional barriers to care. She has recently been approved for medicaid.   She is concerned about her current regimen and her pregnancy.   Past Medical History:  Diagnosis Date  . Arthritis   . Chronic pain   . Fibromyalgia   . Lupus (HCC)   . Neutropenia [facility])     Past Surgical History:  Procedure Laterality  Date  . CESAREAN SECTION      Family History  Problem Relation Age of Onset  . Hypertension Mother   . Heart attack Father        In his 62s. Died from it.  . Rheum arthritis Maternal Grandmother   . Lupus Maternal Grandmother   . Diabetes Maternal Grandmother   . Heart disease Maternal Grandfather   . Diabetes Maternal Grandfather   . Breast cancer Maternal Aunt   . Pancreatic cancer Maternal Uncle   . Diabetes Maternal Aunt   . Diabetes Maternal Aunt     Social History   Socioeconomic History  . Marital status: Divorced    Spouse name: Not on file  . Number of children: Not on file  . Years of education: Not on file  . Highest education level: Not on file  Occupational History  . Not on file  Tobacco Use  . Smoking status: Current Every Day Smoker    Types: Cigarettes  . Smokeless tobacco: Never Used  Vaping Use  . Vaping Use: Never used  Substance and Sexual Activity  . Alcohol use: No  . Drug use: No  . Sexual activity: Never  Other Topics Concern  . Not on file  Social History Narrative  . Not on file   Social Determinants of Health   Financial Resource Strain:   . Difficulty of Paying Living Expenses: Not on file  Food Insecurity:   . Worried About Programme researcher, broadcasting/film/video in the Last Year: Not on file  .  Ran Out of Food in the Last Year: Not on file  Transportation Needs:   . Lack of Transportation (Medical): Not on file  . Lack of Transportation (Non-Medical): Not on file  Physical Activity:   . Days of Exercise per Week: Not on file  . Minutes of Exercise per Session: Not on file  Stress:   . Feeling of Stress : Not on file  Social Connections:   . Frequency of Communication with Friends and Family: Not on file  . Frequency of Social Gatherings with Friends and Family: Not on file  . Attends Religious Services: Not on file  . Active Member of Clubs or Organizations: Not on file  . Attends BankerClub or Organization Meetings: Not on file  . Marital  Status: Not on file  Intimate Partner Violence:   . Fear of Current or Ex-Partner: Not on file  . Emotionally Abused: Not on file  . Physically Abused: Not on file  . Sexually Abused: Not on file    Outpatient Medications Prior to Visit  Medication Sig Dispense Refill  . Calcium Carb-Cholecalciferol (CALCIUM 600 + D PO) Take 1 tablet by mouth daily.    . clobetasol cream (TEMOVATE) 0.05 % Apply 1 application topically 2 (two) times daily. 60 g 2  . cloNIDine (CATAPRES) 0.2 MG tablet Take 1 tablet (0.2 mg total) by mouth at bedtime. 30 tablet 5  . hydroxychloroquine (PLAQUENIL) 200 MG tablet Take 2 tablets (400 mg total) by mouth daily. 60 tablet 5  . lamoTRIgine (LAMICTAL) 200 MG tablet Take 1 tablet (200 mg total) by mouth daily. 30 tablet 5  . OLANZapine (ZYPREXA) 20 MG tablet Take 1 tablet (20 mg total) by mouth at bedtime. Must have appt for more refills. 30 tablet 5  . omeprazole (PRILOSEC) 20 MG capsule TAKE 2 CAPSULES BY MOUTH TWICE DAILY AS NEEDED FOR ACID REFLUX. 30 capsule 11  . polyethylene glycol powder (GLYCOLAX/MIRALAX) 17 GM/SCOOP powder Take 17 g by mouth 2 (two) times daily as needed. 3350 g 1  . predniSONE (DELTASONE) 20 MG tablet TAKE 1 TABLET BY MOUTH TWICE DAILY WITH A MEAL. 60 tablet 5  . Prenatal Vit-Fe Fumarate-FA (MULTIVITAMIN-PRENATAL) 27-0.8 MG TABS tablet Take 1 tablet by mouth daily at 12 noon.    Marland Kitchen. amoxicillin-clavulanate (AUGMENTIN) 875-125 MG tablet Take 1 tablet by mouth 2 (two) times daily. (Patient not taking: Reported on 09/03/2020) 20 tablet 0   No facility-administered medications prior to visit.    Allergies  Allergen Reactions  . Codeine Swelling  . Nsaids Other (See Comments)    Interacts with GERD  . Sulfa Antibiotics Swelling    ROS Review of Systems  Respiratory: Negative.   Cardiovascular: Positive for chest pain (on the right side of her chest) and palpitations (random).  Musculoskeletal: Positive for joint swelling.       Knee pain        Objective:    Physical Exam Constitutional:      General: She is not in acute distress. HENT:     Head: Normocephalic and atraumatic.  Cardiovascular:     Rate and Rhythm: Normal rate and regular rhythm.     Pulses: Normal pulses.     Heart sounds: Normal heart sounds.  Pulmonary:     Effort: Pulmonary effort is normal.     Breath sounds: Normal breath sounds.  Abdominal:     Palpations: Abdomen is soft.  Musculoskeletal:        General: Normal range of motion.  Skin:    General: Skin is warm and dry.     Capillary Refill: Capillary refill takes less than 2 seconds.  Neurological:     General: No focal deficit present.     Mental Status: She is alert and oriented to person, place, and time.  Psychiatric:     Comments: anxious     BP (!) 154/84 (BP Location: Left Arm, Patient Position: Sitting, Cuff Size: Normal)   Pulse 90   Temp 98.3 F (36.8 C) (Temporal)   Resp 16   Ht 5\' 3"  (1.6 m)   Wt 137 lb 3.2 oz (62.2 kg)   LMP 04/24/2020   SpO2 100%   BMI 24.30 kg/m  Wt Readings from Last 3 Encounters:  09/03/20 137 lb 3.2 oz (62.2 kg)  04/27/20 176 lb (79.8 kg)  11/15/19 180 lb (81.6 kg)     Health Maintenance Due  Topic Date Due  . PAP SMEAR-Modifier  Never done    There are no preventive care reminders to display for this patient.  Lab Results  Component Value Date   TSH 0.570 09/03/2020   Lab Results  Component Value Date   WBC 4.7 09/03/2020   HGB 13.6 09/03/2020   HCT 41.7 09/03/2020   MCV 87 09/03/2020   PLT 310 09/03/2020   Lab Results  Component Value Date   NA 139 09/03/2020   K 4.1 09/03/2020   CO2 26 03/06/2019   GLUCOSE 99 09/03/2020   BUN 12 09/03/2020   CREATININE 0.69 09/03/2020   BILITOT 0.4 09/03/2020   ALKPHOS 59 09/03/2020   AST 21 09/03/2020   ALT 25 03/06/2019   PROT 7.0 09/03/2020   ALBUMIN 4.6 09/03/2020   CALCIUM 9.6 09/03/2020   ANIONGAP 11 10/08/2018   No results found for: CHOL No results found for: HDL  No results found for: LDLCALC No results found for: TRIG No results found for: CHOLHDL Lab Results  Component Value Date   HGBA1C 5.6 09/03/2020      Assessment & Plan:   Problem List Items Addressed This Visit      Other   Bipolar 1 disorder with moderate mania (HCC) Continue with current regimen   Relevant Orders   Ambulatory referral to Psychiatry   SLE (systemic lupus erythematosus) (HCC) Continue with current treatment until able to establish care with rheumatologist   Relevant Orders   Ambulatory referral to Rheumatology    Other Visit Diagnoses    Pelvic pain    -  Primary   Relevant Orders   POCT URINALYSIS DIP (CLINITEK) (Completed)   POCT urine pregnancy (Completed)   CBC with Differential/Platelet (Completed)   Missed period       Relevant Orders   POCT urine pregnancy (Completed)   Weight loss       Relevant Orders   TSH (Completed)   Comp. Metabolic Panel (12) (Completed)   POCT glycosylated hemoglobin (Hb A1C) (Completed)   POCT glucose (manual entry) (Completed)   Positive pregnancy test     Prenatal vitamins prescribed Discuss medications with patient and precautions reviewed advised patient to further discuss with OB provider   Relevant Orders   hCG, quantitative, pregnancy   Ambulatory referral to Obstetrics / Gynecology      Meds ordered this encounter  Medications  . Prenatal Vit-Fe Sulfate-FA-DHA (PRENATAL VITAMIN/MIN +DHA) 27-0.8-200 MG CAPS    Sig: Take 1 capsule by mouth daily.    Dispense:  90 capsule    Refill:  3  Order Specific Question:   Supervising Provider    Answer:   Quentin Angst [[id number]]    Follow-up: Return in about 6 months (around 03/04/2021) for medical record release, follow up as needed.    Barbette Merino, NP

## 2020-09-04 ENCOUNTER — Telehealth: Payer: Self-pay | Admitting: Nurse Practitioner

## 2020-09-04 LAB — CBC WITH DIFFERENTIAL/PLATELET
Basophils Absolute: 0 10*3/uL (ref 0.0–0.2)
Basos: 0 %
EOS (ABSOLUTE): 0 10*3/uL (ref 0.0–0.4)
Eos: 0 %
Hematocrit: 41.7 % (ref 34.0–46.6)
Hemoglobin: 13.6 g/dL (ref 11.1–15.9)
Immature Grans (Abs): 0 10*3/uL (ref 0.0–0.1)
Immature Granulocytes: 0 %
Lymphocytes Absolute: 1.5 10*3/uL (ref 0.7–3.1)
Lymphs: 31 %
MCH: 28.2 pg (ref 26.6–33.0)
MCHC: 32.6 g/dL (ref 31.5–35.7)
MCV: 87 fL (ref 79–97)
Monocytes Absolute: 0.6 10*3/uL (ref 0.1–0.9)
Monocytes: 12 %
Neutrophils Absolute: 2.7 10*3/uL (ref 1.4–7.0)
Neutrophils: 57 %
Platelets: 310 10*3/uL (ref 150–450)
RBC: 4.82 x10E6/uL (ref 3.77–5.28)
RDW: 12.8 % (ref 11.7–15.4)
WBC: 4.7 10*3/uL (ref 3.4–10.8)

## 2020-09-04 LAB — COMP. METABOLIC PANEL (12)
AST: 21 IU/L (ref 0–40)
Albumin/Globulin Ratio: 1.9 (ref 1.2–2.2)
Albumin: 4.6 g/dL (ref 3.8–4.8)
Alkaline Phosphatase: 59 IU/L (ref 44–121)
BUN/Creatinine Ratio: 17 (ref 9–23)
BUN: 12 mg/dL (ref 6–20)
Bilirubin Total: 0.4 mg/dL (ref 0.0–1.2)
Calcium: 9.6 mg/dL (ref 8.7–10.2)
Chloride: 104 mmol/L (ref 96–106)
Creatinine, Ser: 0.69 mg/dL (ref 0.57–1.00)
GFR calc Af Amer: 132 mL/min/{1.73_m2} (ref >59)
GFR calc non Af Amer: 115 mL/min/{1.73_m2} (ref >59)
Globulin, Total: 2.4 g/dL (ref 1.5–4.5)
Glucose: 99 mg/dL (ref 65–99)
Potassium: 4.1 mmol/L (ref 3.5–5.2)
Sodium: 139 mmol/L (ref 134–144)
Total Protein: 7 g/dL (ref 6.0–8.5)

## 2020-09-04 LAB — TSH: TSH: 0.57 u[IU]/mL (ref 0.450–4.500)

## 2020-09-04 NOTE — Telephone Encounter (Signed)
MyChart message was sent.

## 2020-09-07 ENCOUNTER — Other Ambulatory Visit: Payer: Self-pay

## 2020-09-07 DIAGNOSIS — O0990 Supervision of high risk pregnancy, unspecified, unspecified trimester: Secondary | ICD-10-CM

## 2020-09-07 MED ORDER — PRENATAL 27-0.8 MG PO TABS: 1.0000 | ORAL_TABLET | Freq: Every day | ORAL | 11 refills | Status: DC

## 2020-09-11 ENCOUNTER — Telehealth: Payer: Self-pay | Admitting: Nurse Practitioner

## 2020-09-11 NOTE — Telephone Encounter (Signed)
Referral Followup °

## 2020-09-13 ENCOUNTER — Other Ambulatory Visit: Payer: Self-pay

## 2020-09-13 ENCOUNTER — Encounter (HOSPITAL_COMMUNITY): Payer: Self-pay

## 2020-09-13 ENCOUNTER — Emergency Department (HOSPITAL_COMMUNITY)
Admission: EM | Admit: 2020-09-13 | Discharge: 2020-09-13 | Disposition: A | Discharge status: Home / Self Care | Payer: Medicaid Other | Attending: Emergency Medicine | Admitting: Emergency Medicine

## 2020-09-13 DIAGNOSIS — F17210 Nicotine dependence, cigarettes, uncomplicated: Secondary | ICD-10-CM | POA: Diagnosis not present

## 2020-09-13 DIAGNOSIS — R109 Unspecified abdominal pain: Secondary | ICD-10-CM | POA: Diagnosis present

## 2020-09-13 DIAGNOSIS — Z79899 Other long term (current) drug therapy: Secondary | ICD-10-CM | POA: Diagnosis not present

## 2020-09-13 DIAGNOSIS — O200 Threatened abortion: Secondary | ICD-10-CM | POA: Diagnosis not present

## 2020-09-13 DIAGNOSIS — Z3A00 Weeks of gestation of pregnancy not specified: Secondary | ICD-10-CM | POA: Diagnosis not present

## 2020-09-13 LAB — BASIC METABOLIC PANEL
Anion gap: 7 (ref 5–15)
BUN: 13 mg/dL (ref 6–20)
CO2: 27 mmol/L (ref 22–32)
Calcium: 8.5 mg/dL — ABNORMAL LOW (ref 8.9–10.3)
Chloride: 101 mmol/L (ref 98–111)
Creatinine, Ser: 0.63 mg/dL (ref 0.44–1.00)
GFR, Estimated: >60 mL/min (ref >60)
Glucose, Bld: 89 mg/dL (ref 70–99)
Potassium: 3.3 mmol/L — ABNORMAL LOW (ref 3.5–5.1)
Sodium: 135 mmol/L (ref 135–145)

## 2020-09-13 LAB — CBC
HCT: 36.6 % (ref 36.0–46.0)
Hemoglobin: 11.9 g/dL — ABNORMAL LOW (ref 12.0–15.0)
MCH: 29.3 pg (ref 26.0–34.0)
MCHC: 32.5 g/dL (ref 30.0–36.0)
MCV: 90.1 fL (ref 80.0–100.0)
Platelets: 251 10*3/uL (ref 150–400)
RBC: 4.06 MIL/uL (ref 3.87–5.11)
RDW: 13.6 % (ref 11.5–15.5)
WBC: 5.9 10*3/uL (ref 4.0–10.5)
nRBC: 0.3 % — ABNORMAL HIGH (ref 0.0–0.2)

## 2020-09-13 LAB — URINALYSIS, ROUTINE W REFLEX MICROSCOPIC
Bilirubin Urine: NEGATIVE
Glucose, UA: NEGATIVE mg/dL
Hgb urine dipstick: NEGATIVE
Ketones, ur: NEGATIVE mg/dL
Leukocytes,Ua: NEGATIVE
Nitrite: NEGATIVE
Protein, ur: NEGATIVE mg/dL
Specific Gravity, Urine: 1.017 (ref 1.005–1.030)
pH: 6 (ref 5.0–8.0)

## 2020-09-13 LAB — I-STAT BETA HCG BLOOD, ED (MC, WL, AP ONLY): I-stat hCG, quantitative: >2000 m[IU]/mL — ABNORMAL HIGH (ref <5)

## 2020-09-13 LAB — ABO/RH: ABO/RH(D): A POS

## 2020-09-13 LAB — HCG, QUANTITATIVE, PREGNANCY: hCG, Beta Chain, Quant, S: 24292 m[IU]/mL — ABNORMAL HIGH (ref <5)

## 2020-09-13 NOTE — ED Triage Notes (Signed)
Pt arrives from home via POV c/o intermittent spotting that Pt reports is bright red and is involving right side lower abdominal pain. Pts last reported menstrual cycle is apprx 07/29/20.

## 2020-09-13 NOTE — ED Notes (Signed)
PT educated with DC instructions and verbalized complete understanding, denies questions at this time. Pt IV removed fully intact and dressing applied. PT self ambulated to exit with steady gait.

## 2020-09-13 NOTE — Discharge Instructions (Signed)
Your blood work has shown that you are blood level for pregnancy is about 24,000.  You will need to share this number with your gynecologist when you follow-up in 2 days.  If you cannot follow-up with your gynecologist at that time you may come back to the ER for a repeat blood test.  If you have increasing pain or heavier bleeding you should return to the ER for a formal ultrasound.  Tylenol only for pain

## 2020-09-13 NOTE — ED Provider Notes (Addendum)
[facility] EMERGENCY DEPARTMENT Provider Note   CSN: [id number] Arrival date & time: 09/13/20  2103     History Chief Complaint  Patient presents with   Abdominal Pain    Wendy Herman is a 34 y.o. female.  HPI   This patient is a 34 year old female, she has a history of lupus, she is G3, P2 with 1 living child, states that for approximately 2 weeks she has had some lower abdominal cramping which seems to come and go, over the last couple of days it is associated with some vaginal spotting on the toilet paper.  She denies heavy bleeding, denies any other symptoms, states that she has last had a menstrual cycle starting in the 3rd-4th week of October.  She has been regular for the last year.  She has no other symptoms including fevers or chills, coughing or shortness of breath, has not seen her OB/GYN, she is scheduled to have her initial visit soon.  She states she has not taken a home pregnancy test but is assuming she is pregnant secondary to missing her cycle.  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 Died from it.   Rheum arthritis Maternal Grandmother    Lupus Maternal Grandmother    Diabetes Maternal Grandmother    Heart disease Maternal Grandfather    Diabetes Maternal Grandfather    Breast cancer Maternal Aunt    Pancreatic cancer Maternal Uncle    Diabetes Maternal Aunt    Diabetes Maternal Aunt     Social History   Tobacco Use   Smoking status: Current Every Day Smoker    Types: Cigarettes   Smokeless tobacco: Never Used  Vaping Use   Vaping Use: Never used  Substance Use Topics   Alcohol use: No   Drug use: No    Home Medications Prior to Admission medications   Medication Sig Start Date End Date Taking? Authorizing Provider  Calcium Carb-Cholecalciferol (CALCIUM 600 + D PO) Take 1 tablet by mouth daily.    [provider]  clobetasol cream (TEMOVATE) 0.05 % Apply 1 application topically 2 (two) times daily. 04/27/20  Barbette Merino, NP  cloNIDine (CATAPRES) 0.2 MG tablet Take 1 tablet (0.2 mg total) by mouth at bedtime. 04/27/20 10/24/20  Barbette Merino, NP  hydroxychloroquine (PLAQUENIL) 200 MG tablet Take 2 tablets (400 mg total) by mouth daily. 04/27/20   Barbette Merino, NP  lamoTRIgine (LAMICTAL) 200 MG tablet Take 1 tablet (200 mg total) by mouth daily. 04/27/20   Barbette Merino, NP  OLANZapine (ZYPREXA) 20 MG tablet Take 1 tablet (20 mg total) by mouth at bedtime. Must have appt for more refills. 04/27/20 10/24/20  Barbette Merino, NP  omeprazole (PRILOSEC) 20 MG capsule TAKE 2 CAPSULES BY MOUTH TWICE DAILY AS NEEDED FOR ACID REFLUX. 04/27/20   Barbette Merino, NP  polyethylene glycol powder (GLYCOLAX/MIRALAX) 17 GM/SCOOP powder Take 17 g by mouth 2 (two) times daily as needed. 04/27/20   Barbette Merino, NP  predniSONE (DELTASONE) 20 MG tablet TAKE 1 TABLET BY MOUTH TWICE DAILY WITH A MEAL. 04/27/20   Barbette Merino, NP  Prenatal Vit-Fe  Fumarate-FA (MULTIVITAMIN-PRENATAL) 27-0.8 MG TABS tablet Take 1 tablet by mouth daily at 12 noon. 09/07/20   Levie Heritage, DO  Prenatal Vit-Fe Sulfate-FA-DHA (PRENATAL VITAMIN/MIN +DHA) 27-0.8-200 MG CAPS Take 1 capsule by mouth daily. 09/03/20 09/03/21  Barbette Merino, NP    Allergies    Codeine, Nsaids, and Sulfa antibiotics  Review of Systems   Review of Systems  All other systems reviewed and are negative.   Physical Exam Updated Vital Signs BP (!) 155/109 (BP Location: Right Arm)    Pulse 81    Temp 98.2 F (36.8 C) (Oral)    Resp 16    Ht 1.6 m (5\' 3" )    Wt 63.5 kg    LMP 04/24/2020    SpO2 100%    BMI 24.80 kg/m   Physical Exam Vitals and nursing note reviewed.  Constitutional:      General: She is not in acute distress.    Appearance: She is well-developed and well-nourished.  HENT:     Head: Normocephalic and atraumatic.     Mouth/Throat:     Mouth: Oropharynx is clear and moist.     Pharynx: No oropharyngeal exudate.  Eyes:     General: No scleral icterus.       Right eye: No discharge.        Left eye: No discharge.     Extraocular Movements: EOM normal.     Conjunctiva/sclera: Conjunctivae normal.     Pupils: Pupils are equal, round, and reactive to light.  Neck:     Thyroid: No thyromegaly.     Vascular: No JVD.  Cardiovascular:     Rate and Rhythm: Normal rate and regular rhythm.     Pulses: Intact distal pulses.     Heart sounds: Normal heart sounds. No murmur heard. No friction rub. No gallop.   Pulmonary:     Effort: Pulmonary effort is normal. No respiratory distress.     Breath sounds: Normal breath sounds. No wheezing or rales.  Abdominal:     General: Bowel sounds are normal. There is no distension.     Palpations: Abdomen is soft. There is no mass.     Tenderness: There is no abdominal tenderness.  Genitourinary:    Comments: Pt declines Musculoskeletal:        General: No tenderness or edema. Normal range of motion.     Cervical back:  Normal range of motion and neck supple.  Lymphadenopathy:  Cervical: No cervical adenopathy.  Skin:    General: Skin is warm and dry.     Findings: No erythema or rash.  Neurological:     Mental Status: She is alert.     Coordination: Coordination normal.  Psychiatric:        Mood and Affect: Mood and affect normal.        Behavior: Behavior normal.     ED Results / Procedures / Treatments   Labs (all labs ordered are listed, but only abnormal results are displayed) Labs Reviewed  HCG, QUANTITATIVE, PREGNANCY - Abnormal; Notable for the following components:      Result Value   hCG, Beta Chain, Quant, S 24,292 (*)    All other components within normal limits  CBC - Abnormal; Notable for the following components:   Hemoglobin 11.9 (*)    nRBC 0.3 (*)    All other components within normal limits  BASIC METABOLIC PANEL - Abnormal; Notable for the following components:   Potassium 3.3 (*)    Calcium 8.5 (*)    All other components within normal limits  I-STAT BETA HCG BLOOD, ED ([facility], [facility], [facility] ONLY) - Abnormal; Notable for the following components:   I-stat hCG, quantitative >2,000.0 (*)    All other components within normal limits  URINALYSIS, ROUTINE W REFLEX MICROSCOPIC  ABO/RH    EKG None  Radiology No results found.  Procedures Procedures (including critical care time)  Medications Ordered in ED Medications - No data to display  ED Course  I have reviewed the triage vital signs and the nursing notes.  Pertinent labs & imaging results that were available during my care of the patient were reviewed by me and considered in my medical decision making (see chart for details).    MDM Rules/Calculators/A&P                          I have evaluated the patient with a female chaperone, the patient defers and declines a pelvic exam, I think this is reasonable as she has just a small amount of spotting and on the bedside ultrasound she appears to have an intrauterine  gestational sac, I do not see any obvious fetal pole, this may be a very early pregnancy, will check an hCG quant, ABO Rh, the patient is scheduled to follow-up with OB/GYN.  She has no significant abdominal tenderness, vital signs are totally normal except for some hypertension  I have counseled the patient extensively on healthy living, avoiding substances, certain medications and sticking to Tylenol for pain.  She is agreeable  UA negative - stable for d/c  Final Clinical Impression(s) / ED Diagnoses Final diagnoses:  Threatened miscarriage    Rx / DC Orders ED Discharge Orders    None       Eber Hong, MD 09/13/20 2321    Eber Hong, MD 09/13/20 2322

## 2020-09-19 DIAGNOSIS — M329 Systemic lupus erythematosus, unspecified: Secondary | ICD-10-CM | POA: Diagnosis not present

## 2020-09-19 DIAGNOSIS — O200 Threatened abortion: Secondary | ICD-10-CM | POA: Diagnosis not present

## 2020-09-19 DIAGNOSIS — Z3A01 Less than 8 weeks gestation of pregnancy: Secondary | ICD-10-CM | POA: Diagnosis not present

## 2020-09-19 DIAGNOSIS — O99891 Other specified diseases and conditions complicating pregnancy: Secondary | ICD-10-CM | POA: Diagnosis not present

## 2020-09-21 ENCOUNTER — Inpatient Hospital Stay (HOSPITAL_COMMUNITY)
Admission: AD | Admit: 2020-09-21 | Discharge: 2020-09-21 | Disposition: A | Discharge status: Home / Self Care | Payer: Medicaid Other | Attending: Family Medicine | Admitting: Family Medicine

## 2020-09-21 ENCOUNTER — Inpatient Hospital Stay (HOSPITAL_COMMUNITY): Payer: Medicaid Other

## 2020-09-21 ENCOUNTER — Other Ambulatory Visit: Payer: Self-pay

## 2020-09-21 ENCOUNTER — Encounter (HOSPITAL_COMMUNITY): Payer: Self-pay | Admitting: Family Medicine

## 2020-09-21 ENCOUNTER — Telehealth: Payer: Self-pay

## 2020-09-21 DIAGNOSIS — O99331 Smoking (tobacco) complicating pregnancy, first trimester: Secondary | ICD-10-CM | POA: Insufficient documentation

## 2020-09-21 DIAGNOSIS — F17210 Nicotine dependence, cigarettes, uncomplicated: Secondary | ICD-10-CM | POA: Diagnosis not present

## 2020-09-21 DIAGNOSIS — Z7952 Long term (current) use of systemic steroids: Secondary | ICD-10-CM | POA: Diagnosis not present

## 2020-09-21 DIAGNOSIS — O208 Other hemorrhage in early pregnancy: Secondary | ICD-10-CM | POA: Insufficient documentation

## 2020-09-21 DIAGNOSIS — O99341 Other mental disorders complicating pregnancy, first trimester: Secondary | ICD-10-CM | POA: Diagnosis not present

## 2020-09-21 DIAGNOSIS — O209 Hemorrhage in early pregnancy, unspecified: Secondary | ICD-10-CM | POA: Diagnosis not present

## 2020-09-21 DIAGNOSIS — O468X1 Other antepartum hemorrhage, first trimester: Secondary | ICD-10-CM | POA: Diagnosis not present

## 2020-09-21 DIAGNOSIS — Z3A01 Less than 8 weeks gestation of pregnancy: Secondary | ICD-10-CM

## 2020-09-21 DIAGNOSIS — M797 Fibromyalgia: Secondary | ICD-10-CM | POA: Diagnosis not present

## 2020-09-21 DIAGNOSIS — Z79899 Other long term (current) drug therapy: Secondary | ICD-10-CM | POA: Diagnosis not present

## 2020-09-21 DIAGNOSIS — F319 Bipolar disorder, unspecified: Secondary | ICD-10-CM | POA: Diagnosis not present

## 2020-09-21 DIAGNOSIS — Z3490 Encounter for supervision of normal pregnancy, unspecified, unspecified trimester: Secondary | ICD-10-CM

## 2020-09-21 HISTORY — DX: Bipolar disorder, unspecified: F31.9

## 2020-09-21 HISTORY — DX: Anxiety disorder, unspecified: F41.9

## 2020-09-21 HISTORY — DX: Essential (primary) hypertension: I10

## 2020-09-21 HISTORY — DX: Depression, unspecified: F32.A

## 2020-09-21 HISTORY — DX: Unspecified infectious disease: B99.9

## 2020-09-21 LAB — CBC
HCT: 38.9 % (ref 36.0–46.0)
Hemoglobin: 12.6 g/dL (ref 12.0–15.0)
MCH: 28.3 pg (ref 26.0–34.0)
MCHC: 32.4 g/dL (ref 30.0–36.0)
MCV: 87.4 fL (ref 80.0–100.0)
Platelets: 283 10*3/uL (ref 150–400)
RBC: 4.45 MIL/uL (ref 3.87–5.11)
RDW: 13.4 % (ref 11.5–15.5)
WBC: 7.4 10*3/uL (ref 4.0–10.5)
nRBC: 0 % (ref 0.0–0.2)

## 2020-09-21 LAB — HCG, QUANTITATIVE, PREGNANCY: hCG, Beta Chain, Quant, S: 64590 m[IU]/mL — ABNORMAL HIGH (ref <5)

## 2020-09-21 MED ORDER — PREPLUS 27-1 MG PO TABS: 1.0000 | ORAL_TABLET | Freq: Every day | ORAL | 13 refills | Status: AC

## 2020-09-21 NOTE — MAU Note (Addendum)
Was seen in the hospital for bleeding x2(spotting at first, 2nd time was heavier); was bright red and like a period on Saturday, no bleeding yesterday or today.  Has only had blood work done, no [place].  Told threatened miscarriage.  Denies any pain, has had some "annoying cramping, but no pain"

## 2020-09-21 NOTE — MAU Provider Note (Signed)
History    CSN: [id number]  Arrival date and time: 09/21/20 1722  Event Date/Time  First Provider Initiated Contact with Patient 09/21/20 1814     Chief Complaint  Patient presents with  . Follow-up    Vag bleeding in preg   HPI Wendy Herman is a 34 y.o. G3P0201 at [redacted]w[redacted]d who presents to MAU with chief complaint of abdominal cramping. This is a recurrent problem, onset around 09/13/2020. Patient was evaluated at [facility] ED and diagnosed with pregnancy of unknown location. She was subsequently evaluated at [facility] but states she is unsure of follow-up and continues to have intermittent spotting.  She denies abdominal cramping, dysuria, low back pain, fever or recent illness. Her health history is significant for Lupus.  OB History    Gravida  3   Para  2   Term      Preterm  2   AB  0   Living  1     SAB      IAB      Ectopic      Multiple      Live Births  2        Obstetric Comments  #2:C/s "her lupus was attacking the baby"  (CA) #1 Kendell Bane        Past Medical History:  Diagnosis Date  . Anxiety   . Arthritis   . Bipolar 1 disorder (HCC)   . Chronic pain   . Depression   . Fibromyalgia   . Hypertension    'clonidine" for that  . Infection    UTI  . Lupus (HCC)   . Neutropenia [facility])     Past Surgical History:  Procedure Laterality Date  . CESAREAN SECTION    . WISDOM TOOTH EXTRACTION      Family History  Problem Relation Age of Onset  . Hypertension Mother   . Heart attack Father        In his 39s. Died from it.  . Depression Father   . Rheum arthritis Maternal Grandmother   . Lupus Maternal Grandmother   . Diabetes Maternal Grandmother   . Heart disease Maternal Grandfather   . Diabetes Maternal Grandfather   . Breast cancer Maternal Aunt   . Pancreatic cancer Maternal Uncle   . Diabetes Maternal Aunt   . Diabetes Maternal Aunt     Social History   Tobacco Use  . Smoking status: Current Every Day Smoker     Packs/day: 0.25    Years: 15.00    Pack years: 3.75    Types: Cigarettes  . Smokeless tobacco: Never Used  Vaping Use  . Vaping Use: Never used  Substance Use Topics  . Alcohol use: No  . Drug use: No    Comment: takes Suboxone- but not daily    Allergies:  Allergies  Allergen Reactions  . Sulfa Antibiotics Swelling    Mouth/facial swelling  . Codeine Swelling    "not actually allergic, just doesn't want to take it  . Nsaids Other (See Comments)    Interacts with GERD    Medications Prior to Admission  Medication Sig Dispense Refill Last Dose  . clobetasol cream (TEMOVATE) 0.05 % Apply 1 application topically 2 (two) times daily. 60 g 2 Past Month at Unknown time  . cloNIDine (CATAPRES) 0.2 MG tablet Take 1 tablet (0.2 mg total) by mouth at bedtime. 30 tablet 5 09/21/2020 at Unknown time  . docusate sodium (COLACE) 100 MG capsule Take 100  mg by mouth 2 (two) times daily.   09/21/2020 at Unknown time  . hydroxychloroquine (PLAQUENIL) 200 MG tablet Take 2 tablets (400 mg total) by mouth daily. 60 tablet 5 09/20/2020 at Unknown time  . lamoTRIgine (LAMICTAL) 200 MG tablet Take 1 tablet (200 mg total) by mouth daily. 30 tablet 5 09/21/2020 at Unknown time  . omeprazole (PRILOSEC) 20 MG capsule TAKE 2 CAPSULES BY MOUTH TWICE DAILY AS NEEDED FOR ACID REFLUX. 30 capsule 11 09/20/2020 at Unknown time  . polyethylene glycol powder (GLYCOLAX/MIRALAX) 17 GM/SCOOP powder Take 17 g by mouth 2 (two) times daily as needed. 3350 g 1 Past Week at Unknown time  . predniSONE (DELTASONE) 20 MG tablet TAKE 1 TABLET BY MOUTH TWICE DAILY WITH A MEAL. (Patient taking differently: 40 mg daily. TAKE 1 TABLET BY MOUTH TWICE DAILY WITH A MEAL.) 60 tablet 5 09/21/2020 at Unknown time  . Prenatal Vit-Fe Fumarate-FA (MULTIVITAMIN-PRENATAL) 27-0.8 MG TABS tablet Take 1 tablet by mouth daily at 12 noon. 30 tablet 11 09/21/2020 at Unknown time  . buprenorphine-naloxone (SUBOXONE) 2-0.5 mg SUBL SL tablet Place 1  tablet under the tongue daily. 4mg      . Calcium Carb-Cholecalciferol (CALCIUM 600 + D PO) Take 1 tablet by mouth daily.     OLANZapine (ZYPREXA) 20 MG tablet Take 1 tablet (20 mg total) by mouth at bedtime. Must have appt for more refills. 30 tablet 5   . Prenatal Vit-Fe Sulfate-FA-DHA (PRENATAL VITAMIN/MIN +DHA) 27-0.8-200 MG CAPS Take 1 capsule by mouth daily. 90 capsule 3     Review of Systems  Genitourinary: Positive for vaginal bleeding.  All other systems reviewed and are negative.  Physical Exam   Blood pressure 124/77, pulse 78, temperature 98.8 F (37.1 C), temperature source Oral, resp. rate 16, height 5\' 3"  (1.6 m), weight 65 kg, last menstrual period 07/29/2020, SpO2 100 %.  Physical Exam Vitals and nursing note reviewed. Exam conducted with a chaperone present.  Constitutional:      General: She is not in acute distress. Cardiovascular:     Rate and Rhythm: Normal rate.     Pulses: Normal pulses.     Heart sounds: Normal heart sounds.  Pulmonary:     Effort: Pulmonary effort is normal.     Breath sounds: Normal breath sounds.  Abdominal:     General: Abdomen is flat. Bowel sounds are normal.     Tenderness: There is no abdominal tenderness. There is no right CVA tenderness or left CVA tenderness.  Skin:    Capillary Refill: Capillary refill takes less than 2 seconds.  Neurological:     Mental Status: She is alert and oriented to person, place, and time.  Psychiatric:        Mood and Affect: Mood normal.        Behavior: Behavior normal.        Thought Content: Thought content normal.        Judgment: Judgment normal.     MAU Course  Procedures  Orders Placed This Encounter  Procedures  . OB Comp Less 14 Wks  . CBC  . hCG, quantitative, pregnancy   Patient Vitals for the past 24 hrs:  BP Temp Temp src Pulse Resp SpO2 Height Weight  09/21/20 1751 124/77 98.8 F (37.1 C) Oral 78 16 100 % 5\' 3"  (1.6 m) 65 kg   Results for orders placed or  performed during the hospital encounter of 09/21/20 (from the past 24 hour(s))  CBC  Status: None   Collection Time: 09/21/20  5:49 PM  Result Value Ref Range   WBC 7.4 4.0 - 10.5 K/uL   RBC 4.45 3.87 - 5.11 MIL/uL   Hemoglobin 12.6 12.0 - 15.0 g/dL   HCT 31.4 97.0 - 26.3 %   MCV 87.4 80.0 - 100.0 fL   MCH 28.3 26.0 - 34.0 pg   MCHC 32.4 30.0 - 36.0 g/dL   RDW 78.5 88.5 - 02.7 %   Platelets 283 150 - 400 K/uL   nRBC 0.0 0.0 - 0.2 %  hCG, quantitative, pregnancy     Status: Abnormal   Collection Time: 09/21/20  5:49 PM  Result Value Ref Range   hCG, Beta Chain, Quant, S 64,590 (H) <5 mIU/mL   US OB Comp Less 14 Wks  Result Date: 09/21/2020 CLINICAL DATA:  Bleeding. EXAM: OBSTETRIC <14 WK ULTRASOUND TECHNIQUE: Transabdominal ultrasound was performed for evaluation of the gestation as well as the maternal uterus and adnexal regions. COMPARISON:  None. FINDINGS: Intrauterine gestational sac: Single Yolk sac:  Visualized. Embryo:  Visualized. Cardiac Activity: Visualized. Heart Rate: 153 bpm CRL:   13.7 mm   7 w 4 d                  [place] EDC: 05/06/2021 Subchorionic hemorrhage: There is suggestion of a small volume subchorionic hemorrhage. Maternal uterus/adnexae: There is no significant maternal abnormality detected. IMPRESSION: 1. Single live IUP at 7 weeks and 4 days. 2. Probable small volume subchorionic hemorrhage. Electronically Signed   By: Katherine Mantle M.D.   On: 09/21/2020 19:41   Assessment and Plan  --34 y.o. G3P0201 with SIUP at [redacted]w[redacted]d  --Small subchorionic hematoma, pelvic rest advised --Discharge home in stable condition with first trimester precautions  F/U: --Patient has New OB with MD at [facility] on 09/23/2020  Calvert Cantor , CNM 09/21/2020, 8:38 PM

## 2020-09-21 NOTE — Telephone Encounter (Signed)
Pt called stating she was seen in the ED in [facility] for vaginal bleeding. Pt states she is still having some light bleeding. Pt made aware that she should go to [facility] at [address] [facility]. [place] if she starts bleeding heavy like a period. Understanding was voiced.  Tabetha Haraway l Cassara Nida, CMA

## 2020-09-21 NOTE — Discharge Instructions (Signed)
Prenatal Care Prenatal care is health care during pregnancy. It helps you and your unborn baby (fetus) stay as healthy as possible. Prenatal care may be provided by a midwife, a family practice health care provider, or a childbirth and pregnancy specialist (obstetrician). How does this affect me? During pregnancy, you will be closely monitored for any new conditions that might develop. To lower your risk of pregnancy complications, you and your health care provider will talk about any underlying conditions you have. How does this affect my baby? Early and consistent prenatal care increases the chance that your baby will be healthy during pregnancy. Prenatal care lowers the risk that your baby will be:  Born early (prematurely).  Smaller than expected at birth (small for gestational age). What can I expect at the first prenatal care visit? Your first prenatal care visit will likely be the longest. You should schedule your first prenatal care visit as soon as you know that you are pregnant. Your first visit is a good time to talk about any questions or concerns you have about pregnancy. At your visit, you and your health care provider will talk about:  Your medical history, including: ? Any past pregnancies. ? Your family's medical history. ? The baby's father's medical history. ? Any long-term (chronic) health conditions you have and how you manage them. ? Any surgeries or procedures you have had. ? Any current over-the-counter or prescription medicines, herbs, or supplements you are taking.  Other factors that could pose a risk to your baby, including:  Your home setting and your stress levels, including: ? Exposure to abuse or violence. ? Household financial strain. ? Mental health conditions you have.  Your daily health habits, including diet and exercise. Your health care provider will also:  Measure your weight, height, and blood pressure.  Do a physical exam, including a pelvic  and breast exam.  Perform blood tests and urine tests to check for: ? Urinary tract infection. ? Sexually transmitted infections (STIs). ? Low iron levels in your blood (anemia). ? Blood type and certain proteins on red blood cells (Rh antibodies). ? Infections and immunity to viruses, such as hepatitis B and rubella. ? HIV (human immunodeficiency virus).  Do an ultrasound to confirm your baby's growth and development and to help predict your estimated due date (EDD). This ultrasound is done with a probe that is inserted into the vagina (transvaginal ultrasound).  Discuss your options for genetic screening.  Give you information about how to keep yourself and your baby healthy, including: ? Nutrition and taking vitamins. ? Physical activity. ? How to manage pregnancy symptoms such as nausea and vomiting (morning sickness). ? Infections and substances that may be harmful to your baby and how to avoid them. ? Food safety. ? Dental care. ? Working. ? Travel. ? Warning signs to watch for and when to call your health care provider. How often will I have prenatal care visits? After your first prenatal care visit, you will have regular visits throughout your pregnancy. The visit schedule is often as follows:  Up to week 28 of pregnancy: once every 4 weeks.  28-36 weeks: once every 2 weeks.  After 36 weeks: every week until delivery. Some women may have visits more or less often depending on any underlying health conditions and the health of the baby. Keep all follow-up and prenatal care visits as told by your health care provider. This is important. What happens during routine prenatal care visits? Your health care provider will:    Measure your weight and blood pressure.  Check for fetal heart sounds.  Measure the height of your uterus in your abdomen (fundal height). This may be measured starting around week 20 of pregnancy.  Check the position of your baby inside your  uterus.  Ask questions about your diet, sleeping patterns, and whether you can feel the baby move.  Review warning signs to watch for and signs of labor.  Ask about any pregnancy symptoms you are having and how you are dealing with them. Symptoms may include: ? Headaches. ? Nausea and vomiting. ? Vaginal discharge. ? Swelling. ? Fatigue. ? Constipation. ? Any discomfort, including back or pelvic pain. Make a list of questions to ask your health care provider at your routine visits. What tests might I have during prenatal care visits? You may have blood, urine, and imaging tests throughout your pregnancy, such as:  Urine tests to check for glucose, protein, or signs of infection.  Glucose tests to check for a form of diabetes that can develop during pregnancy (gestational diabetes mellitus). This is usually done around week 24 of pregnancy.  An ultrasound to check your baby's growth and development and to check for birth defects. This is usually done around week 20 of pregnancy.  A test to check for group B strep (GBS) infection. This is usually done around week 36 of pregnancy.  Genetic testing. This may include blood or imaging tests, such as an ultrasound. Some genetic tests are done during the first trimester and some are done during the second trimester. What else can I expect during prenatal care visits? Your health care provider may recommend getting certain vaccines during pregnancy. These may include:  A yearly flu shot (annual influenza vaccine). This is especially important if you will be pregnant during flu season.  Tdap (tetanus, diphtheria, pertussis) vaccine. Getting this vaccine during pregnancy can protect your baby from whooping cough (pertussis) after birth. This vaccine may be recommended between weeks 27 and 36 of pregnancy. Later in your pregnancy, your health care provider may give you information about:  Childbirth and breastfeeding classes.  Choosing a  health care provider for your baby.  Umbilical cord banking.  Breastfeeding.  Birth control after your baby is born.  The hospital labor and delivery unit and how to tour it.  Registering at the hospital before you go into labor. Where to find more information  Office on Women's Health: womenshealth.gov  American Pregnancy Association: americanpregnancy.org  March of Dimes: marchofdimes.org Summary  Prenatal care helps you and your baby stay as healthy as possible during pregnancy.  Your first prenatal care visit will most likely be the longest.  You will have visits and tests throughout your pregnancy to monitor your health and your baby's health.  Bring a list of questions to your visits to ask your health care provider.  Make sure to keep all follow-up and prenatal care visits with your health care provider. This information is not intended to replace advice given to you by your health care provider. Make sure you discuss any questions you have with your health care provider. Document Revised: 01/09/2019 Document Reviewed: 09/18/2017 Elsevier Patient Education  2020 Elsevier Inc.  Safe Medications in Pregnancy   Acne: Benzoyl Peroxide Salicylic Acid  Backache/Headache: Tylenol: 2 regular strength every 4 hours OR              2 Extra strength every 6 hours  Colds/Coughs/Allergies: Benadryl (alcohol free) 25 mg every 6 hours as needed Breath   right strips Claritin Cepacol throat lozenges Chloraseptic throat spray Cold-Eeze- up to three times per day Cough drops, alcohol free Flonase (by prescription only) Guaifenesin Mucinex Robitussin DM (plain only, alcohol free) Saline nasal spray/drops Sudafed (pseudoephedrine) & Actifed ** use only after [redacted] weeks gestation and if you do not have high blood pressure Tylenol Vicks Vaporub Zinc lozenges Zyrtec   Constipation: Colace Ducolax suppositories Fleet enema Glycerin suppositories Metamucil Milk of  magnesia Miralax Senokot Smooth move tea  Diarrhea: Kaopectate Imodium A-D  *NO pepto Bismol  Hemorrhoids: Anusol Anusol HC Preparation H Tucks  Indigestion: Tums Maalox Mylanta Zantac  Pepcid  Insomnia: Benadryl (alcohol free) 25mg  every 6 hours as needed Tylenol PM Unisom, no Gelcaps  Leg Cramps: Tums MagGel  Nausea/Vomiting:  Bonine Dramamine Emetrol Ginger extract Sea bands Meclizine  Nausea medication to take during pregnancy:  Unisom (doxylamine succinate 25 mg tablets) Take one tablet daily at bedtime. If symptoms are not adequately controlled, the dose can be increased to a maximum recommended dose of two tablets daily (1/2 tablet in the morning, 1/2 tablet mid-afternoon and one at bedtime). Vitamin B6 100mg  tablets. Take one tablet twice a day (up to 200 mg per day).  Skin Rashes: Aveeno products Benadryl cream or 25mg  every 6 hours as needed Calamine Lotion 1% cortisone cream  Yeast infection: Gyne-lotrimin 7 Monistat 7   **If taking multiple medications, please check labels to avoid duplicating the same active ingredients **take medication as directed on the label ** Do not exceed 4000 mg of tylenol in 24 hours **Do not take medications that contain aspirin or ibuprofen

## 2020-09-23 ENCOUNTER — Other Ambulatory Visit: Payer: Self-pay

## 2020-09-23 ENCOUNTER — Encounter: Payer: Medicaid Other | Admitting: Family Medicine

## 2020-09-23 ENCOUNTER — Other Ambulatory Visit (HOSPITAL_COMMUNITY)
Admission: RE | Admit: 2020-09-23 | Discharge: 2020-09-23 | Disposition: A | Discharge status: Home / Self Care | Payer: Medicaid Other | Source: Ambulatory Visit | Attending: Family Medicine | Admitting: Family Medicine

## 2020-09-23 ENCOUNTER — Encounter: Payer: Self-pay | Admitting: Family Medicine

## 2020-09-23 ENCOUNTER — Ambulatory Visit (INDEPENDENT_AMBULATORY_CARE_PROVIDER_SITE_OTHER): Payer: Medicaid Other | Admitting: Family Medicine

## 2020-09-23 VITALS — BP 149/88 | HR 79 | Wt 144.0 lb

## 2020-09-23 DIAGNOSIS — O169 Unspecified maternal hypertension, unspecified trimester: Secondary | ICD-10-CM | POA: Insufficient documentation

## 2020-09-23 DIAGNOSIS — O99119 Other diseases of the blood and blood-forming organs and certain disorders involving the immune mechanism complicating pregnancy, unspecified trimester: Secondary | ICD-10-CM

## 2020-09-23 DIAGNOSIS — Z3A08 8 weeks gestation of pregnancy: Secondary | ICD-10-CM | POA: Diagnosis not present

## 2020-09-23 DIAGNOSIS — F310 Bipolar disorder, current episode hypomanic: Secondary | ICD-10-CM

## 2020-09-23 DIAGNOSIS — O99320 Drug use complicating pregnancy, unspecified trimester: Secondary | ICD-10-CM

## 2020-09-23 DIAGNOSIS — O09899 Supervision of other high risk pregnancies, unspecified trimester: Secondary | ICD-10-CM | POA: Insufficient documentation

## 2020-09-23 DIAGNOSIS — D6862 Lupus anticoagulant syndrome: Secondary | ICD-10-CM | POA: Insufficient documentation

## 2020-09-23 DIAGNOSIS — Z3480 Encounter for supervision of other normal pregnancy, unspecified trimester: Secondary | ICD-10-CM | POA: Diagnosis not present

## 2020-09-23 DIAGNOSIS — F1190 Opioid use, unspecified, uncomplicated: Secondary | ICD-10-CM | POA: Insufficient documentation

## 2020-09-23 DIAGNOSIS — O0990 Supervision of high risk pregnancy, unspecified, unspecified trimester: Secondary | ICD-10-CM | POA: Insufficient documentation

## 2020-09-23 DIAGNOSIS — O99330 Smoking (tobacco) complicating pregnancy, unspecified trimester: Secondary | ICD-10-CM | POA: Insufficient documentation

## 2020-09-23 DIAGNOSIS — F1120 Opioid dependence, uncomplicated: Secondary | ICD-10-CM

## 2020-09-23 MED ORDER — ONDANSETRON 4 MG PO TBDP: 4.0000 mg | ORAL_TABLET | Freq: Four times a day (QID) | ORAL | 6 refills | Status: DC | PRN

## 2020-09-23 MED ORDER — BUPRENORPHINE HCL-NALOXONE HCL 4-1 MG SL FILM: 1.0000 | ORAL_FILM | Freq: Every day | SUBLINGUAL | 0 refills | Status: DC

## 2020-09-23 NOTE — Progress Notes (Signed)
Subjective:  Wendy Herman is a [id number] [redacted]w[redacted]d being seen today for her first obstetrical visit.  Her obstetrical history is significant for obesity and two prior pregnancies. First pregnancy was in 2006 - the baby had turner's syndrome and was delivered around 24 weeks after being in the hospital for about a week. The second pregnancy was in 2012. She was also in the hospital for about 1 week, then delivered via c/s after failing a CST. She is unsure of the exact reason why she was in the hospital for the second prengnacy. She does have lupus and is controlled on plaquinel and prednisone - current prednisone dose is 40mg  daily. She used to be on chronic percocet and oxycodone, but has switched to suboxone and feels like her pain is fairly well controlled on that. She is currently taking 4mg  2-3 times a week (due to transportation issues as she has to go to the clinic to get the suboxone daily). She would take it every day if available. She also has bipolar disorder and is on lamictal - is well controlled with this. Is on clonidine for Tics and HTN. FOB is patient's partner, who is involved.  Patient does intend to breast feed. Pregnancy history fully reviewed.  Patient reports no complaints.  BP (!) 149/88   Pulse 79   Wt 144 lb (65.3 kg)   LMP 07/29/2020   BMI 25.51 kg/m   HISTORY: OB History  Gravida Para Term Preterm AB Living  3 2   2  0 1  SAB IAB Ectopic Multiple Live Births          2    # Outcome Date GA Lbr Len/2nd Weight Sex Delivery Anes PTL Lv  3 Current           2 Preterm 2012    F CS-Unspec   LIV  1 Preterm 2006 [redacted]w[redacted]d   F Vag-Spont  Y DEC     Birth Comments: turner's syndrome "45x"    Obstetric Comments  #2:C/s "her lupus was attacking the baby"  (CA)  #1 [redacted]w[redacted]d    Past Medical History:  Diagnosis Date  . Anxiety   . Arthritis   . Bipolar 1 disorder (HCC)   . Chronic pain   . Depression   . Fibromyalgia   . Hypertension    'clonidine" for that  .  Infection    UTI  . Lupus (HCC)   . Neutropenia [facility])     Past Surgical History:  Procedure Laterality Date  . CESAREAN SECTION    . WISDOM TOOTH EXTRACTION      Family History  Problem Relation Age of Onset  . Hypertension Mother   . Heart attack Father        In his 68s. Died from it.  . Depression Father   . Rheum arthritis Maternal Grandmother   . Lupus Maternal Grandmother   . Diabetes Maternal Grandmother   . Heart disease Maternal Grandfather   . Diabetes Maternal Grandfather   . Breast cancer Maternal Aunt   . Pancreatic cancer Maternal Uncle   . Diabetes Maternal Aunt   . Diabetes Maternal Aunt      Exam  BP (!) 149/88   Pulse 79   Wt 144 lb (65.3 kg)   LMP 07/29/2020   BMI 25.51 kg/m   Chaperone present during exam  CONSTITUTIONAL: Well-developed, well-nourished female in no acute distress.  HENT:  Normocephalic, atraumatic, External right and left ear normal. Oropharynx is clear  and moist EYES: Conjunctivae and EOM are normal. Pupils are equal, round, and reactive to light. No scleral icterus.  NECK: Normal range of motion, supple, no masses.  Normal thyroid.  CARDIOVASCULAR: Normal heart rate noted, regular rhythm RESPIRATORY: Clear to auscultation bilaterally. Effort and breath sounds normal, no problems with respiration noted. BREASTS: Symmetric in size. No masses, skin changes, nipple drainage, or lymphadenopathy. ABDOMEN: Soft, normal bowel sounds, no distention noted.  No tenderness, rebound or guarding.  PELVIC: Normal appearing external genitalia; normal appearing vaginal mucosa and cervix. No abnormal discharge noted. Normal uterine size, no other palpable masses, no uterine or adnexal tenderness. MUSCULOSKELETAL: Normal range of motion. No tenderness.  No cyanosis, clubbing, or edema.  2+ distal pulses. SKIN: Skin is warm and dry. No rash noted. Not diaphoretic. No erythema. No pallor. NEUROLOGIC: Alert and oriented to person, place, and time.  Normal reflexes, muscle tone coordination. No cranial nerve deficit noted. PSYCHIATRIC: Normal mood and affect. Normal behavior. Normal judgment and thought content.    Assessment:    Pregnancy: [id number] Patient Active Problem List   Diagnosis Date Noted  . Supervision of other normal pregnancy, antepartum 09/23/2020  . History of preterm delivery, currently pregnant 09/23/2020  . Tobacco smoking affecting pregnancy, antepartum 09/23/2020  . Hypertension affecting pregnancy, antepartum 09/23/2020  . Lupus anticoagulant affecting pregnancy, antepartum (HCC) 09/23/2020  . Suboxone maintenance treatment complicating pregnancy, antepartum, unspecified trimester (HCC) 09/23/2020  . Bipolar 1 disorder with moderate mania (HCC) 04/27/2020  . Vitamin D insufficiency 10/21/2019  . 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 Bipolar affective disorder, current episode hypomanic (HCC) 12/10/2018  . Chest pain 08/27/2018  . Neutropenia (HCC) 08/27/2018  . Anemia 08/27/2018  . SLE (systemic lupus erythematosus) (HCC) 08/27/2018      Plan:   1. [redacted] weeks gestation of pregnancy - Cytology - PAP( [place]) - Urine Culture - Protein / creatinine ratio, urine  2. Supervision of other normal pregnancy, antepartum [place] matches LMP. Discussed delivery at [facility] Discussed integrative team with FM/OB, OB, MFM. Will need MFM consult. Desires Panorama - Cytology - PAP( [place]) - Urine Culture - Protein / creatinine ratio, urine  3. Suboxone maintenance treatment complicating pregnancy, antepartum, unspecified trimester (HCC) Start suboxone 4mg  daily.  4. Tobacco smoking affecting pregnancy, antepartum   5. History of preterm delivery, currently pregnant Appears iatrogenic preterm delivery. 17-OH Progesterone not indicated. - Cytology - PAP( [place]) - Urine Culture - Protein / creatinine ratio, urine  6. Hypertension affecting pregnancy, antepartum CMP/CBC/UP:C Continue  clonidine for now. Start ASA at 12 weeks Serial - Protein / creatinine ratio, urine  7. Lupus anticoagulant affecting pregnancy, antepartum (HCC) Continue plaquenil and prednisone She has referral pending for rheumatology No history of DVT - Cytology - PAP( [place]) - Urine Culture - Protein / creatinine ratio, urine  8. Bipolar affective disorder, current episode hypomanic (HCC) Continue lamictal     Problem list reviewed and updated. 75% of 45 min visit spent on counseling and coordination of care.     [place] 09/23/2020

## 2020-09-23 NOTE — Progress Notes (Signed)
Patient currently on Suboxone.  Patient reports she has history of high blood pressure.  Patient has Lupus.   DATING AND VIABILITY SONOGRAM   Wendy Herman is a 34 y.o. year old G3P0201 with LMP Patient's last menstrual period was 07/29/2020. which would correlate to  [redacted]w[redacted]d weeks gestation.  She has regular menstrual cycles.   She is here today for a confirmatory initial sonogram.    GESTATION: [place]     FETAL ACTIVITY:          Heart rate    172          The fetus is active.    GESTATIONAL AGE AND  BIOMETRICS:  Gestational criteria: Estimated Date of Delivery: 05/05/21 by LMP now at [redacted]w[redacted]d  Previous Scans:2      CROWN RUMP LENGTH          1.57 cm        8-0 weeks                                                                               AVERAGE EGA(BY THIS SCAN):  8-0 weeks  WORKING EDD( LMP ): 05/05/2021     TECHNICIAN COMMENTS:  Patient informed that the ultrasound is considered a limited obstetric ultrasound and is not intended to be a complete ultrasound exam. Patient also informed that the ultrasound is not being completed with the intent of assessing for fetal or placental anomalies or any pelvic abnormalities. Explained that the purpose of today's ultrasound is to assess for fetal heart rate. Patient acknowledges the purpose of the exam and the limitations of the study.    [name] 09/23/2020 3:41 PM

## 2020-09-24 LAB — PROTEIN / CREATININE RATIO, URINE
Creatinine, Urine: 160.2 mg/dL
Protein, Ur: 10.9 mg/dL
Protein/Creat Ratio: 68 mg/g creat (ref 0–200)

## 2020-09-25 LAB — URINE CULTURE

## 2020-09-26 LAB — BETA HCG QUANT (REF LAB): hCG Quant: 2225 m[IU]/mL

## 2020-09-26 LAB — SPECIMEN STATUS REPORT

## 2020-10-01 ENCOUNTER — Encounter: Payer: Self-pay | Admitting: Family Medicine

## 2020-10-01 ENCOUNTER — Ambulatory Visit (INDEPENDENT_AMBULATORY_CARE_PROVIDER_SITE_OTHER): Payer: Medicaid Other

## 2020-10-01 ENCOUNTER — Other Ambulatory Visit: Payer: Self-pay

## 2020-10-01 VITALS — BP 128/68 | HR 63 | Wt 139.0 lb

## 2020-10-01 DIAGNOSIS — Z3A09 9 weeks gestation of pregnancy: Secondary | ICD-10-CM

## 2020-10-01 DIAGNOSIS — Z3480 Encounter for supervision of other normal pregnancy, unspecified trimester: Secondary | ICD-10-CM

## 2020-10-01 DIAGNOSIS — R87810 Cervical high risk human papillomavirus (HPV) DNA test positive: Secondary | ICD-10-CM | POA: Insufficient documentation

## 2020-10-01 LAB — CYTOLOGY - PAP
Chlamydia: NEGATIVE
Comment: NEGATIVE
Comment: NEGATIVE
Comment: NORMAL
Diagnosis: UNDETERMINED — AB
High risk HPV: POSITIVE — AB
Neisseria Gonorrhea: NEGATIVE

## 2020-10-01 NOTE — Progress Notes (Signed)
Patient presents with complaining of vaginal bleeding in pregnancy. Patient states she saw some blood tinged mucous discharge this morning.  Bedside ultrasound performed and no subchronic hemorraghe noted. Fetus is active and fetal heart rate 152bpm. Patient reassured.   Patient also sent to lab for blood work to be drawn since she was unable to collect at her new ob visit. Wendy Stammer RN

## 2020-10-01 NOTE — Progress Notes (Signed)
Chart reviewed - agree with CMA/RN documentation.  ° °

## 2020-10-02 LAB — COMPREHENSIVE METABOLIC PANEL
ALT: 18 IU/L (ref 0–32)
AST: 19 IU/L (ref 0–40)
Albumin/Globulin Ratio: 2.1 (ref 1.2–2.2)
Albumin: 4.4 g/dL (ref 3.8–4.8)
Alkaline Phosphatase: 49 IU/L (ref 44–121)
BUN/Creatinine Ratio: 21 (ref 9–23)
BUN: 13 mg/dL (ref 6–20)
Bilirubin Total: 0.4 mg/dL (ref 0.0–1.2)
CO2: 24 mmol/L (ref 20–29)
Calcium: 9.8 mg/dL (ref 8.7–10.2)
Chloride: 102 mmol/L (ref 96–106)
Creatinine, Ser: 0.63 mg/dL (ref 0.57–1.00)
GFR calc Af Amer: 136 mL/min/{1.73_m2} (ref >59)
GFR calc non Af Amer: 118 mL/min/{1.73_m2} (ref >59)
Globulin, Total: 2.1 g/dL (ref 1.5–4.5)
Glucose: 80 mg/dL (ref 65–99)
Potassium: 4.6 mmol/L (ref 3.5–5.2)
Sodium: 139 mmol/L (ref 134–144)
Total Protein: 6.5 g/dL (ref 6.0–8.5)

## 2020-10-02 LAB — CBC/D/PLT+RPR+RH+ABO+RUB AB...
Antibody Screen: NEGATIVE
Basophils Absolute: 0 10*3/uL (ref 0.0–0.2)
Basos: 0 %
EOS (ABSOLUTE): 0 10*3/uL (ref 0.0–0.4)
Eos: 0 %
HCV Ab: 0.1 s/co ratio (ref 0.0–0.9)
HIV Screen 4th Generation wRfx: NONREACTIVE
Hematocrit: 37.3 % (ref 34.0–46.6)
Hemoglobin: 12.8 g/dL (ref 11.1–15.9)
Hepatitis B Surface Ag: NEGATIVE
Immature Grans (Abs): 0 10*3/uL (ref 0.0–0.1)
Immature Granulocytes: 0 %
Lymphocytes Absolute: 1.7 10*3/uL (ref 0.7–3.1)
Lymphs: 31 %
MCH: 29.4 pg (ref 26.6–33.0)
MCHC: 34.3 g/dL (ref 31.5–35.7)
MCV: 86 fL (ref 79–97)
Monocytes Absolute: 0.6 10*3/uL (ref 0.1–0.9)
Monocytes: 11 %
Neutrophils Absolute: 3 10*3/uL (ref 1.4–7.0)
Neutrophils: 58 %
Platelets: 265 10*3/uL (ref 150–450)
RBC: 4.36 x10E6/uL (ref 3.77–5.28)
RDW: 13.1 % (ref 11.7–15.4)
RPR Ser Ql: NONREACTIVE
Rh Factor: POSITIVE
Rubella Antibodies, IGG: 10.1 index (ref >0.99)
WBC: 5.3 10*3/uL (ref 3.4–10.8)

## 2020-10-02 LAB — HCV INTERPRETATION

## 2020-10-08 ENCOUNTER — Encounter: Payer: Medicaid Other | Admitting: Family Medicine

## 2020-10-08 ENCOUNTER — Telehealth: Payer: Self-pay | Admitting: General Practice

## 2020-10-08 ENCOUNTER — Other Ambulatory Visit: Payer: Self-pay | Admitting: Family Medicine

## 2020-10-08 ENCOUNTER — Encounter: Payer: Self-pay | Admitting: Advanced Practice Midwife

## 2020-10-08 DIAGNOSIS — M329 Systemic lupus erythematosus, unspecified: Secondary | ICD-10-CM | POA: Insufficient documentation

## 2020-10-08 DIAGNOSIS — O99119 Other diseases of the blood and blood-forming organs and certain disorders involving the immune mechanism complicating pregnancy, unspecified trimester: Secondary | ICD-10-CM

## 2020-10-08 DIAGNOSIS — Z3480 Encounter for supervision of other normal pregnancy, unspecified trimester: Secondary | ICD-10-CM

## 2020-10-08 DIAGNOSIS — D6862 Lupus anticoagulant syndrome: Secondary | ICD-10-CM

## 2020-10-08 DIAGNOSIS — O169 Unspecified maternal hypertension, unspecified trimester: Secondary | ICD-10-CM

## 2020-10-08 MED ORDER — BUPRENORPHINE HCL-NALOXONE HCL 4-1 MG SL FILM: 1.0000 | ORAL_FILM | Freq: Every day | SUBLINGUAL | 0 refills | Status: DC

## 2020-10-08 NOTE — Telephone Encounter (Signed)
Pt notified of [place] with MFM on 10/24/2019 at 12:45pm and verbalized understanding.

## 2020-10-15 ENCOUNTER — Ambulatory Visit (INDEPENDENT_AMBULATORY_CARE_PROVIDER_SITE_OTHER): Payer: Medicaid Other | Admitting: Family Medicine

## 2020-10-15 ENCOUNTER — Other Ambulatory Visit: Payer: Self-pay

## 2020-10-15 ENCOUNTER — Other Ambulatory Visit (HOSPITAL_COMMUNITY)
Admission: RE | Admit: 2020-10-15 | Discharge: 2020-10-15 | Disposition: A | Discharge status: Home / Self Care | Payer: Medicaid Other | Source: Ambulatory Visit | Attending: Family Medicine | Admitting: Family Medicine

## 2020-10-15 VITALS — BP 136/80 | HR 84 | Wt 146.0 lb

## 2020-10-15 DIAGNOSIS — N898 Other specified noninflammatory disorders of vagina: Secondary | ICD-10-CM

## 2020-10-15 DIAGNOSIS — D6862 Lupus anticoagulant syndrome: Secondary | ICD-10-CM | POA: Diagnosis not present

## 2020-10-15 DIAGNOSIS — O99320 Drug use complicating pregnancy, unspecified trimester: Secondary | ICD-10-CM

## 2020-10-15 DIAGNOSIS — Z3A11 11 weeks gestation of pregnancy: Secondary | ICD-10-CM

## 2020-10-15 DIAGNOSIS — Z3480 Encounter for supervision of other normal pregnancy, unspecified trimester: Secondary | ICD-10-CM

## 2020-10-15 DIAGNOSIS — F310 Bipolar disorder, current episode hypomanic: Secondary | ICD-10-CM

## 2020-10-15 DIAGNOSIS — O26891 Other specified pregnancy related conditions, first trimester: Secondary | ICD-10-CM | POA: Diagnosis not present

## 2020-10-15 DIAGNOSIS — O169 Unspecified maternal hypertension, unspecified trimester: Secondary | ICD-10-CM

## 2020-10-15 DIAGNOSIS — F1120 Opioid dependence, uncomplicated: Secondary | ICD-10-CM

## 2020-10-15 DIAGNOSIS — O99119 Other diseases of the blood and blood-forming organs and certain disorders involving the immune mechanism complicating pregnancy, unspecified trimester: Secondary | ICD-10-CM | POA: Diagnosis not present

## 2020-10-15 MED ORDER — BOOST HIGH PROTEIN PO LIQD: 1.0000 | Freq: Two times a day (BID) | ORAL | 6 refills | Status: DC

## 2020-10-15 NOTE — Addendum Note (Signed)
Addended by: Lorelle Gibbs L on: 10/15/2020 02:10 PM   Modules accepted: Orders

## 2020-10-15 NOTE — Progress Notes (Signed)
PRENATAL VISIT NOTE  Subjective:  Wendy Herman is a 35 y.o. G3P0201 at [redacted]w[redacted]d being seen today for ongoing prenatal care.  She is currently monitored for the following issues for this high-risk pregnancy and has Chest pain; Neutropenia (HCC); Anemia; SLE (systemic lupus erythematosus) (HCC); Discoid lupus erythematosus; Bipolar affective disorder, current episode hypomanic (HCC); Vitamin D insufficiency; Bipolar 1 disorder with moderate mania (HCC); Supervision of other normal pregnancy, antepartum; History of preterm delivery, currently pregnant; Tobacco smoking affecting pregnancy, antepartum; Hypertension affecting pregnancy, antepartum; Lupus anticoagulant affecting pregnancy, antepartum (HCC); Suboxone maintenance treatment complicating pregnancy, antepartum, unspecified trimester (HCC); ASCUS with positive high risk HPV cervical; and Systemic lupus erythematosus affecting pregnancy (HCC) on their problem list.  Patient reports no complaints.  Contractions: Not present. Vag. Bleeding: None.  Movement: Absent. Denies leaking of fluid.   The following portions of the patient's history were reviewed and updated as appropriate: allergies, current medications, past family history, past medical history, past social history, past surgical history and problem list.   Objective:   Vitals:   10/15/20 1104  BP: 136/80  Pulse: 84  Weight: 146 lb (66.2 kg)    Fetal Status: Fetal Heart Rate (bpm): 166   Movement: Absent     General:  Alert, oriented and cooperative. Patient is in no acute distress.  Skin: Skin is warm and dry. No rash noted.   Cardiovascular: Normal heart rate noted  Respiratory: Normal respiratory effort, no problems with respiration noted  Abdomen: Soft, gravid, appropriate for gestational age.  Pain/Pressure: Present     Pelvic: Cervical exam deferred        Extremities: Normal range of motion.  Edema: None  Mental Status: Normal mood and affect. Normal behavior. Normal  judgment and thought content.   Assessment and Plan:  Pregnancy: G3P0201 at [redacted]w[redacted]d 1. [redacted] weeks gestation of pregnancy - [place] MFM OB Comp Less 14 Wks; Future - Beta-2 Glycoprotein I Ab,G/M - Cardiolipin antibodies, IgM+IgG - Lupus anticoagulant panel - Sjogrens syndrome-A extractable nuclear antibody - Sjogrens syndrome-B extractable nuclear antibody  2. Supervision of other normal pregnancy, antepartum FHT normal. Appetite improving with zofran.  Boost for the vomiting. - [place] MFM OB Comp Less 14 Wks; Future - Beta-2 Glycoprotein I Ab,G/M - Cardiolipin antibodies, IgM+IgG - Lupus anticoagulant panel - Sjogrens syndrome-A extractable nuclear antibody - Sjogrens syndrome-B extractable nuclear antibody  3. Lupus anticoagulant affecting pregnancy, antepartum (HCC) Check additional labs First trimester [place] - [place] MFM OB Comp Less 14 Wks; Future - Beta-2 Glycoprotein I Ab,G/M - Cardiolipin antibodies, IgM+IgG - Lupus anticoagulant panel - Sjogrens syndrome-A extractable nuclear antibody - Sjogrens syndrome-B extractable nuclear antibody  4. Hypertension affecting pregnancy, antepartum BP controlled On clonodine for this and tics  5. Bipolar affective disorder, current episode hypomanic (HCC) On lamictal  6. Suboxone maintenance treatment complicating pregnancy, antepartum, unspecified trimester (HCC) Currently controlled. ? Whether suboxone is increasing her tics. Will watch for now.  7. Vaginal discharge Wet prep  Preterm labor symptoms and general obstetric precautions including but not limited to vaginal bleeding, contractions, leaking of fluid and fetal movement were reviewed in detail with the patient. Please refer to After Visit Summary for other counseling recommendations.   No follow-ups on file.  Future Appointments  Date Time Provider Department Center  10/23/2020 12:30 PM [facility] NURSE [facility]  10/23/2020 12:45 PM [facility] US5 [facility]  11/11/2020 11:15 AM  Anyanwu, Jethro Bastos, MD [facility] None  03/04/2021 10:40 AM Barbette Merino, NP [facility] None  Truett Mainland, DO

## 2020-10-17 LAB — CARDIOLIPIN ANTIBODIES, IGM+IGG
Anticardiolipin IgG: <9 GPL U/mL (ref 0–14)
Anticardiolipin IgM: 25 MPL U/mL — ABNORMAL HIGH (ref 0–12)

## 2020-10-17 LAB — BETA-2 GLYCOPROTEIN I AB,G/M
Beta-2 Glyco 1 IgM: <9 GPI IgM units (ref 0–32)
Beta-2 Glyco I IgG: <9 GPI IgG units (ref 0–20)

## 2020-10-17 LAB — LUPUS ANTICOAGULANT PANEL
Dilute Viper Venom Time: 29.5 s (ref 0.0–47.0)
PTT Lupus Anticoagulant: 34.5 s (ref 0.0–51.9)

## 2020-10-17 LAB — SJOGRENS SYNDROME-A EXTRACTABLE NUCLEAR ANTIBODY: ENA SSA (RO) Ab: 0.5 AI (ref 0.0–0.9)

## 2020-10-17 LAB — SJOGRENS SYNDROME-B EXTRACTABLE NUCLEAR ANTIBODY: ENA SSB (LA) Ab: <0.2 AI (ref 0.0–0.9)

## 2020-10-19 LAB — CERVICOVAGINAL ANCILLARY ONLY
Bacterial Vaginitis (gardnerella): POSITIVE — AB
Candida Glabrata: POSITIVE — AB
Candida Vaginitis: POSITIVE — AB
Chlamydia: NEGATIVE
Comment: NEGATIVE
Comment: NEGATIVE
Comment: NEGATIVE
Comment: NEGATIVE
Comment: NEGATIVE
Comment: NORMAL
Neisseria Gonorrhea: NEGATIVE
Trichomonas: NEGATIVE

## 2020-10-20 MED ORDER — MICONAZOLE NITRATE 2 % VA CREA: 1.0000 | TOPICAL_CREAM | Freq: Every day | VAGINAL | 2 refills | Status: DC

## 2020-10-20 MED ORDER — METRONIDAZOLE 500 MG PO TABS: 500.0000 mg | ORAL_TABLET | Freq: Two times a day (BID) | ORAL | 0 refills | Status: DC

## 2020-10-20 NOTE — Addendum Note (Signed)
Addended by: Levie Heritage on: 10/20/2020 04:10 PM   Modules accepted: Orders

## 2020-10-22 ENCOUNTER — Telehealth (INDEPENDENT_AMBULATORY_CARE_PROVIDER_SITE_OTHER): Payer: Medicaid Other

## 2020-10-22 MED ORDER — BUPRENORPHINE HCL-NALOXONE HCL 2-0.5 MG SL FILM
1.0000 | ORAL_FILM | Freq: Every day | SUBLINGUAL | 0 refills | Status: DC
Start: 2020-10-22 — End: 2020-10-29

## 2020-10-22 NOTE — Telephone Encounter (Signed)
Having increased tics - thinks that it's due to suboxone. Would like to reduce the dose. Will reduce Suboxone to 2/0.5mg . Discussed withdrawal symptoms - if starts to develop withdrawal symptoms, we can increase the dose again.

## 2020-10-22 NOTE — Telephone Encounter (Signed)
Patient called stating it is time to refill her suboxone. Patient is also wanting to talk with Dr. Adrian Blackwater about possibly reducing the dosage on her suboxone. Will route to provider. [name] RN

## 2020-10-23 ENCOUNTER — Ambulatory Visit: Payer: Medicaid Other | Attending: Family Medicine

## 2020-10-23 ENCOUNTER — Other Ambulatory Visit: Payer: Self-pay

## 2020-10-23 ENCOUNTER — Ambulatory Visit: Payer: Medicaid Other | Admitting: *Deleted

## 2020-10-23 ENCOUNTER — Other Ambulatory Visit: Payer: Self-pay | Admitting: *Deleted

## 2020-10-23 ENCOUNTER — Encounter: Payer: Self-pay | Admitting: *Deleted

## 2020-10-23 VITALS — BP 127/71 | HR 75

## 2020-10-23 DIAGNOSIS — O169 Unspecified maternal hypertension, unspecified trimester: Secondary | ICD-10-CM | POA: Insufficient documentation

## 2020-10-23 DIAGNOSIS — O99119 Other diseases of the blood and blood-forming organs and certain disorders involving the immune mechanism complicating pregnancy, unspecified trimester: Secondary | ICD-10-CM | POA: Insufficient documentation

## 2020-10-23 DIAGNOSIS — Z8751 Personal history of pre-term labor: Secondary | ICD-10-CM

## 2020-10-23 DIAGNOSIS — Z3480 Encounter for supervision of other normal pregnancy, unspecified trimester: Secondary | ICD-10-CM | POA: Insufficient documentation

## 2020-10-23 DIAGNOSIS — D6862 Lupus anticoagulant syndrome: Secondary | ICD-10-CM | POA: Diagnosis not present

## 2020-10-26 ENCOUNTER — Other Ambulatory Visit: Payer: Self-pay | Admitting: *Deleted

## 2020-10-26 DIAGNOSIS — Z8751 Personal history of pre-term labor: Secondary | ICD-10-CM

## 2020-10-29 ENCOUNTER — Other Ambulatory Visit: Payer: Self-pay | Admitting: Family Medicine

## 2020-10-29 MED ORDER — BUPRENORPHINE HCL-NALOXONE HCL 2-0.5 MG SL FILM
1.0000 | ORAL_FILM | Freq: Two times a day (BID) | SUBLINGUAL | 0 refills | Status: DC
Start: 2020-10-29 — End: 2020-11-13

## 2020-10-29 NOTE — Progress Notes (Signed)
Increased dose of Suboxone due to withdrawal symptoms. Increased Suboxone to 2mg  BID.

## 2020-11-05 MED ORDER — CEFADROXIL 500 MG PO CAPS: 500.0000 mg | ORAL_CAPSULE | Freq: Two times a day (BID) | ORAL | 0 refills | Status: DC

## 2020-11-05 NOTE — Addendum Note (Signed)
Addended by: Levie Heritage on: 11/05/2020 12:51 PM   Modules accepted: Orders

## 2020-11-11 ENCOUNTER — Telehealth: Payer: Self-pay

## 2020-11-11 ENCOUNTER — Encounter: Payer: Medicaid Other | Admitting: Obstetrics & Gynecology

## 2020-11-11 ENCOUNTER — Telehealth: Payer: Self-pay | Admitting: General Practice

## 2020-11-11 NOTE — Telephone Encounter (Signed)
Patient was late ( ) this morning for ROB appt. Patient arrived at 1137am and Dr. Macon Large states she will need to reschedule.   Patient states she needs refill "on the medication Dr. Adrian Blackwater gives me" and I told her I would send a note to him in regards to refilling.  Patient walked out before rescheduling her appointment. [name], RN  11/11/2020 11:47 AM

## 2020-11-11 NOTE — Telephone Encounter (Signed)
Patient called and spoke with Chiquita,CMA at 10:45am and stated that she would be 10 minutes late for appointment.  Pt called again at 11:26am and informed front desk Levin Erp) that she was downstairs and trying to get here as soon as she can.  She walked in at 11:37am and was told that we would have to check with the Provider to see if she will be able to see her today.  Per Dr. Macon Large, pt will need to be rescheduled.  Pt was offered another appointment, however, she got upset and walked out.  She has been late 10 minutes or greater for her appointments.

## 2020-11-12 ENCOUNTER — Other Ambulatory Visit: Payer: Self-pay | Admitting: Nurse Practitioner

## 2020-11-12 ENCOUNTER — Telehealth: Payer: Self-pay

## 2020-11-12 NOTE — Telephone Encounter (Signed)
Spoke with patient advise to use OTC monastat for issue and follow up with OB.  Patient is upset and wants something else sent in.  She says she has a UTI and is on and antibiotic and it is causing the infection.  Advised patient it is not safe for her and the baby for her to take diflucan.  Patient is irritated and hung up.

## 2020-11-13 ENCOUNTER — Other Ambulatory Visit: Payer: Self-pay | Admitting: Family Medicine

## 2020-11-13 MED ORDER — FLUCONAZOLE 150 MG PO TABS: 150.0000 mg | ORAL_TABLET | Freq: Once | ORAL | 0 refills | Status: AC

## 2020-11-13 MED ORDER — BUPRENORPHINE HCL-NALOXONE HCL 2-0.5 MG SL FILM
1.0000 | ORAL_FILM | Freq: Two times a day (BID) | SUBLINGUAL | 0 refills | Status: DC
Start: 2020-11-13 — End: 2020-11-18

## 2020-11-13 NOTE — Addendum Note (Signed)
Addended by: Levie Heritage on: 11/13/2020 03:17 PM   Modules accepted: Orders

## 2020-11-16 ENCOUNTER — Other Ambulatory Visit: Payer: Self-pay | Admitting: Family Medicine

## 2020-11-16 ENCOUNTER — Other Ambulatory Visit: Payer: Self-pay

## 2020-11-16 NOTE — Telephone Encounter (Signed)
Please see refill request.

## 2020-11-17 ENCOUNTER — Telehealth: Payer: Self-pay

## 2020-11-17 NOTE — Telephone Encounter (Signed)
Patient called stating that she has been speaking with Dr. Adrian Blackwater via my chart. Patient states she can see where it looks like Dr. Adrian Blackwater sent in her suboxone refill on Feb 11th but she has reached out to [facility] in [place] and they dont have the refill. Will send message to provider about this. [name] RN

## 2020-11-18 NOTE — Telephone Encounter (Signed)
I have resent it

## 2020-11-19 ENCOUNTER — Other Ambulatory Visit: Payer: Self-pay | Admitting: Family Medicine

## 2020-11-20 ENCOUNTER — Other Ambulatory Visit: Payer: Self-pay

## 2020-11-20 ENCOUNTER — Encounter: Payer: Self-pay | Admitting: *Deleted

## 2020-11-20 ENCOUNTER — Ambulatory Visit: Payer: Medicaid Other | Admitting: *Deleted

## 2020-11-20 ENCOUNTER — Ambulatory Visit: Payer: Medicaid Other | Attending: Maternal & Fetal Medicine

## 2020-11-20 VITALS — BP 138/72 | HR 82

## 2020-11-20 DIAGNOSIS — Z3A16 16 weeks gestation of pregnancy: Secondary | ICD-10-CM

## 2020-11-20 DIAGNOSIS — O34212 Maternal care for vertical scar from previous cesarean delivery: Secondary | ICD-10-CM

## 2020-11-20 DIAGNOSIS — Z8751 Personal history of pre-term labor: Secondary | ICD-10-CM | POA: Diagnosis not present

## 2020-11-20 DIAGNOSIS — M329 Systemic lupus erythematosus, unspecified: Secondary | ICD-10-CM

## 2020-11-20 DIAGNOSIS — O10012 Pre-existing essential hypertension complicating pregnancy, second trimester: Secondary | ICD-10-CM | POA: Diagnosis not present

## 2020-11-20 DIAGNOSIS — Z87798 Personal history of other (corrected) congenital malformations: Secondary | ICD-10-CM | POA: Diagnosis not present

## 2020-11-20 DIAGNOSIS — O99332 Smoking (tobacco) complicating pregnancy, second trimester: Secondary | ICD-10-CM

## 2020-11-20 DIAGNOSIS — F1920 Other psychoactive substance dependence, uncomplicated: Secondary | ICD-10-CM

## 2020-11-20 DIAGNOSIS — O99322 Drug use complicating pregnancy, second trimester: Secondary | ICD-10-CM

## 2020-11-20 DIAGNOSIS — O352XX0 Maternal care for (suspected) hereditary disease in fetus, not applicable or unspecified: Secondary | ICD-10-CM

## 2020-11-20 DIAGNOSIS — O09212 Supervision of pregnancy with history of pre-term labor, second trimester: Secondary | ICD-10-CM

## 2020-11-20 DIAGNOSIS — O99342 Other mental disorders complicating pregnancy, second trimester: Secondary | ICD-10-CM

## 2020-11-20 DIAGNOSIS — O99891 Other specified diseases and conditions complicating pregnancy: Secondary | ICD-10-CM

## 2020-11-20 DIAGNOSIS — F319 Bipolar disorder, unspecified: Secondary | ICD-10-CM

## 2020-11-25 ENCOUNTER — Ambulatory Visit (INDEPENDENT_AMBULATORY_CARE_PROVIDER_SITE_OTHER): Payer: Medicaid Other | Admitting: Family Medicine

## 2020-11-25 ENCOUNTER — Other Ambulatory Visit: Payer: Self-pay

## 2020-11-25 VITALS — BP 124/65 | HR 75 | Wt 149.0 lb

## 2020-11-25 DIAGNOSIS — O99320 Drug use complicating pregnancy, unspecified trimester: Secondary | ICD-10-CM

## 2020-11-25 DIAGNOSIS — M329 Systemic lupus erythematosus, unspecified: Secondary | ICD-10-CM

## 2020-11-25 DIAGNOSIS — F1120 Opioid dependence, uncomplicated: Secondary | ICD-10-CM

## 2020-11-25 DIAGNOSIS — O0990 Supervision of high risk pregnancy, unspecified, unspecified trimester: Secondary | ICD-10-CM | POA: Diagnosis not present

## 2020-11-25 DIAGNOSIS — O99330 Smoking (tobacco) complicating pregnancy, unspecified trimester: Secondary | ICD-10-CM

## 2020-11-25 DIAGNOSIS — O09899 Supervision of other high risk pregnancies, unspecified trimester: Secondary | ICD-10-CM | POA: Diagnosis not present

## 2020-11-25 DIAGNOSIS — O169 Unspecified maternal hypertension, unspecified trimester: Secondary | ICD-10-CM

## 2020-11-25 DIAGNOSIS — G4700 Insomnia, unspecified: Secondary | ICD-10-CM

## 2020-11-25 DIAGNOSIS — Z3A17 17 weeks gestation of pregnancy: Secondary | ICD-10-CM | POA: Diagnosis not present

## 2020-11-25 MED ORDER — HYDROXYCHLOROQUINE SULFATE 200 MG PO TABS: 400.0000 mg | ORAL_TABLET | Freq: Every day | ORAL | 5 refills | Status: AC

## 2020-11-25 MED ORDER — PREDNISONE 20 MG PO TABS: 20.0000 mg | ORAL_TABLET | Freq: Every day | ORAL | Status: DC

## 2020-11-25 MED ORDER — POLYETHYLENE GLYCOL 3350 17 GM/SCOOP PO POWD: 17.0000 g | Freq: Two times a day (BID) | ORAL | 1 refills | Status: DC | PRN

## 2020-11-25 MED ORDER — CLONIDINE HCL 0.2 MG PO TABS: 0.2000 mg | ORAL_TABLET | Freq: Every day | ORAL | 5 refills | Status: DC

## 2020-11-25 MED ORDER — ONDANSETRON 4 MG PO TBDP: 4.0000 mg | ORAL_TABLET | Freq: Four times a day (QID) | ORAL | 6 refills | Status: DC | PRN

## 2020-11-25 NOTE — Progress Notes (Signed)
° °  PRENATAL VISIT NOTE  Subjective:  Wendy Herman is a 35 y.o. G3P0201 at [redacted]w[redacted]d being seen today for ongoing prenatal care.  She is currently monitored for the following issues for this high-risk pregnancy and has Chest pain; Neutropenia (HCC); Anemia; SLE (systemic lupus erythematosus) (HCC); Discoid lupus erythematosus; Bipolar affective disorder, current episode hypomanic (HCC); Vitamin D insufficiency; Bipolar 1 disorder with moderate mania (HCC); Supervision of other normal pregnancy, antepartum; History of preterm delivery, currently pregnant; Tobacco smoking affecting pregnancy, antepartum; Hypertension affecting pregnancy, antepartum; Lupus anticoagulant affecting pregnancy, antepartum (HCC); Suboxone maintenance treatment complicating pregnancy, antepartum, unspecified trimester (HCC); ASCUS with positive high risk HPV cervical; and Systemic lupus erythematosus affecting pregnancy (HCC) on their problem list.  Patient reports no complaints.  Contractions: Not present. Vag. Bleeding: None.  Movement: Absent. Denies leaking of fluid.   The following portions of the patient's history were reviewed and updated as appropriate: allergies, current medications, past family history, past medical history, past social history, past surgical history and problem list.   Objective:   Vitals:   11/25/20 1500  BP: 124/65  Pulse: 75  Weight: 149 lb (67.6 kg)    Fetal Status:     Movement: Absent     General:  Alert, oriented and cooperative. Patient is in no acute distress.  Skin: Skin is warm and dry. No rash noted.   Cardiovascular: Normal heart rate noted  Respiratory: Normal respiratory effort, no problems with respiration noted  Abdomen: Soft, gravid, appropriate for gestational age.  Pain/Pressure: Absent     Pelvic: Cervical exam deferred        Extremities: Normal range of motion.  Edema: None  Mental Status: Normal mood and affect. Normal behavior. Normal judgment and thought content.    Assessment and Plan:  Pregnancy: G3P0201 at [redacted]w[redacted]d 1. [redacted] weeks gestation of pregnancy - Genetic Screening - AFP, Serum, Open Spina Bifida  2. Supervision of high risk pregnancy, antepartum FHT and FH normal - Genetic Screening - AFP, Serum, Open Spina Bifida  3. Hypertension affecting pregnancy, antepartum BP normal. On clonidine (also has tics and insomnia, clonidine helps with both of these) ASA 81mg   4. Insomnia, unspecified type - cloNIDine (CATAPRES) 0.2 MG tablet; Take 1 tablet (0.2 mg total) by mouth at bedtime.  Dispense: 30 tablet; Refill: 5  5. Systemic lupus erythematosus, unspecified SLE type, unspecified organ involvement status (HCC) On plaquenil and prednisone. Has decreased (tapered) prednisone to 20mg  daily. Doing well. No h/o DVT. Has slightly elevated anticardiolipin IgM (low-med level). Will check with MFM to see if should be on anticoagulation. - hydroxychloroquine (PLAQUENIL) 200 MG tablet; Take 2 tablets (400 mg total) by mouth daily.  Dispense: 60 tablet; Refill: 5  6. Tobacco smoking affecting pregnancy, antepartum Is interested in quitting. Nicotrol inhaler.  7. Suboxone maintenance treatment complicating pregnancy, antepartum, unspecified trimester (HCC) Stable on Suboxone 2mg  BID.  Preterm labor symptoms and general obstetric precautions including but not limited to vaginal bleeding, contractions, leaking of fluid and fetal movement were reviewed in detail with the patient. Please refer to After Visit Summary for other counseling recommendations.   No follow-ups on file.  Future Appointments  Date Time Provider Department Center  12/04/2020 10:15 AM [facility] NURSE [facility]  12/04/2020 10:30 AM [facility] US3 [facility]  12/23/2020 10:45 AM 02/03/2021, DO [facility] None  03/04/2021 10:40 AM 12/25/2020, NP [facility] None    Levie Heritage, DO

## 2020-11-27 LAB — AFP, SERUM, OPEN SPINA BIFIDA
AFP MoM: 1.96
AFP Value: 83.4 ng/mL
Gest. Age on Collection Date: 17 weeks
Maternal Age At EDD: 34.5 yr
OSBR Risk 1 IN: 1762
Test Results:: NEGATIVE
Weight: 149 [lb_av]

## 2020-12-01 NOTE — Progress Notes (Signed)
Prior Berkley Harvey #[id number] for Olanzapine 20mg  has been approved 11/30/2020-11/30/2021.

## 2020-12-02 ENCOUNTER — Other Ambulatory Visit: Payer: Self-pay

## 2020-12-02 ENCOUNTER — Other Ambulatory Visit: Payer: Self-pay | Admitting: Family Medicine

## 2020-12-02 MED ORDER — METOCLOPRAMIDE HCL 10 MG PO TABS: 10.0000 mg | ORAL_TABLET | Freq: Four times a day (QID) | ORAL | 2 refills | Status: DC | PRN

## 2020-12-02 MED ORDER — ONDANSETRON 4 MG PO TBDP: 4.0000 mg | ORAL_TABLET | Freq: Four times a day (QID) | ORAL | 3 refills | Status: DC

## 2020-12-03 ENCOUNTER — Other Ambulatory Visit: Payer: Self-pay | Admitting: Family Medicine

## 2020-12-03 MED ORDER — BUPRENORPHINE HCL-NALOXONE HCL 2-0.5 MG SL FILM
1.0000 | ORAL_FILM | Freq: Two times a day (BID) | SUBLINGUAL | 0 refills | Status: DC
Start: 2020-12-03 — End: 2020-12-14

## 2020-12-04 ENCOUNTER — Ambulatory Visit: Payer: Medicaid Other

## 2020-12-04 ENCOUNTER — Ambulatory Visit: Payer: Medicaid Other | Admitting: *Deleted

## 2020-12-04 ENCOUNTER — Other Ambulatory Visit: Payer: Self-pay

## 2020-12-04 ENCOUNTER — Ambulatory Visit: Payer: Medicaid Other | Attending: Maternal & Fetal Medicine

## 2020-12-04 ENCOUNTER — Encounter: Payer: Self-pay | Admitting: *Deleted

## 2020-12-04 ENCOUNTER — Other Ambulatory Visit: Payer: Self-pay | Admitting: Obstetrics

## 2020-12-04 ENCOUNTER — Other Ambulatory Visit: Payer: Self-pay | Admitting: Maternal & Fetal Medicine

## 2020-12-04 VITALS — BP 130/77 | HR 72

## 2020-12-04 DIAGNOSIS — O26892 Other specified pregnancy related conditions, second trimester: Secondary | ICD-10-CM | POA: Diagnosis not present

## 2020-12-04 DIAGNOSIS — F1920 Other psychoactive substance dependence, uncomplicated: Secondary | ICD-10-CM | POA: Diagnosis not present

## 2020-12-04 DIAGNOSIS — Z8751 Personal history of pre-term labor: Secondary | ICD-10-CM

## 2020-12-04 DIAGNOSIS — M329 Systemic lupus erythematosus, unspecified: Secondary | ICD-10-CM | POA: Diagnosis not present

## 2020-12-04 DIAGNOSIS — O10012 Pre-existing essential hypertension complicating pregnancy, second trimester: Secondary | ICD-10-CM

## 2020-12-04 DIAGNOSIS — O09219 Supervision of pregnancy with history of pre-term labor, unspecified trimester: Secondary | ICD-10-CM

## 2020-12-04 DIAGNOSIS — O34212 Maternal care for vertical scar from previous cesarean delivery: Secondary | ICD-10-CM

## 2020-12-04 DIAGNOSIS — O99342 Other mental disorders complicating pregnancy, second trimester: Secondary | ICD-10-CM

## 2020-12-04 DIAGNOSIS — F319 Bipolar disorder, unspecified: Secondary | ICD-10-CM

## 2020-12-04 DIAGNOSIS — O09212 Supervision of pregnancy with history of pre-term labor, second trimester: Secondary | ICD-10-CM

## 2020-12-04 DIAGNOSIS — O99891 Other specified diseases and conditions complicating pregnancy: Secondary | ICD-10-CM

## 2020-12-04 DIAGNOSIS — O99322 Drug use complicating pregnancy, second trimester: Secondary | ICD-10-CM

## 2020-12-04 DIAGNOSIS — Z3A18 18 weeks gestation of pregnancy: Secondary | ICD-10-CM

## 2020-12-04 DIAGNOSIS — Z87798 Personal history of other (corrected) congenital malformations: Secondary | ICD-10-CM

## 2020-12-04 DIAGNOSIS — O358XX0 Maternal care for other (suspected) fetal abnormality and damage, not applicable or unspecified: Secondary | ICD-10-CM

## 2020-12-14 ENCOUNTER — Other Ambulatory Visit: Payer: Self-pay

## 2020-12-15 MED ORDER — BUPRENORPHINE HCL-NALOXONE HCL 2-0.5 MG SL FILM
1.0000 | ORAL_FILM | Freq: Two times a day (BID) | SUBLINGUAL | 0 refills | Status: DC
Start: 2020-12-15 — End: 2020-12-28

## 2020-12-16 ENCOUNTER — Telehealth: Payer: Self-pay

## 2020-12-16 NOTE — Telephone Encounter (Signed)
Pt called stating she had some bleeding when she wiped this morning. Pt states she did have intercourse and noticed the light bleeding afterwards. Pt states baby is moving. Pt made aware that it is normal to have some light spotting after intercourse because the cervix is friable during pregnancy. Pt made aware that she should go to [facility] if she starts bleeding heavy like a period. Understanding was voiced. Daylin Eads l Mylea Roarty, CMA

## 2020-12-22 ENCOUNTER — Telehealth: Payer: Self-pay

## 2020-12-22 NOTE — Telephone Encounter (Signed)
Pt called the after hours line stating she has been vomiting all day. Pt states she was unable to keep any food or drinks down but she is feeling better. Pt advised to go to [facility] if symptoms persists. Understanding was voiced. Dailin Sosnowski l Cigi Bega, CMA

## 2020-12-23 ENCOUNTER — Ambulatory Visit (INDEPENDENT_AMBULATORY_CARE_PROVIDER_SITE_OTHER): Payer: Medicaid Other | Admitting: Family Medicine

## 2020-12-23 ENCOUNTER — Other Ambulatory Visit: Payer: Self-pay

## 2020-12-23 VITALS — BP 134/72 | HR 79 | Wt 148.0 lb

## 2020-12-23 DIAGNOSIS — F310 Bipolar disorder, current episode hypomanic: Secondary | ICD-10-CM

## 2020-12-23 DIAGNOSIS — O0990 Supervision of high risk pregnancy, unspecified, unspecified trimester: Secondary | ICD-10-CM

## 2020-12-23 DIAGNOSIS — F1120 Opioid dependence, uncomplicated: Secondary | ICD-10-CM

## 2020-12-23 DIAGNOSIS — O169 Unspecified maternal hypertension, unspecified trimester: Secondary | ICD-10-CM

## 2020-12-23 DIAGNOSIS — Z3A21 21 weeks gestation of pregnancy: Secondary | ICD-10-CM

## 2020-12-23 DIAGNOSIS — M329 Systemic lupus erythematosus, unspecified: Secondary | ICD-10-CM

## 2020-12-23 DIAGNOSIS — O99320 Drug use complicating pregnancy, unspecified trimester: Secondary | ICD-10-CM

## 2020-12-23 DIAGNOSIS — R112 Nausea with vomiting, unspecified: Secondary | ICD-10-CM

## 2020-12-23 MED ORDER — PROMETHAZINE HCL 25 MG RE SUPP: 25.0000 mg | Freq: Four times a day (QID) | RECTAL | 3 refills | Status: DC | PRN

## 2020-12-23 NOTE — Progress Notes (Signed)
   PRENATAL VISIT NOTE  Subjective:  Wendy Herman is a 35 y.o. G3P0201 at [redacted]w[redacted]d being seen today for ongoing prenatal care.  She is currently monitored for the following issues for this high-risk pregnancy and has Chest pain; Neutropenia (HCC); Anemia; SLE (systemic lupus erythematosus) (HCC); Discoid lupus erythematosus; Bipolar affective disorder, current episode hypomanic (HCC); Vitamin D insufficiency; Bipolar 1 disorder with moderate mania (HCC); Supervision of other normal pregnancy, antepartum; History of preterm delivery, currently pregnant; Tobacco smoking affecting pregnancy, antepartum; Hypertension affecting pregnancy, antepartum; Lupus anticoagulant affecting pregnancy, antepartum (HCC); Suboxone maintenance treatment complicating pregnancy, antepartum, unspecified trimester (HCC); ASCUS with positive high risk HPV cervical; and Systemic lupus erythematosus affecting pregnancy (HCC) on their problem list.  Patient reports having a lot of vomiting. Hasn't been able to take her prednisone for a couple of days. Reglan was helpful, but not for the past couple of days. Is able to take her suboxone. Has also tried zofran, but not helpful.  Contractions: Not present. Vag. Bleeding: None.  Movement: Present. Denies leaking of fluid.   The following portions of the patient's history were reviewed and updated as appropriate: allergies, current medications, past family history, past medical history, past social history, past surgical history and problem list.   Objective:   Vitals:   12/23/20 1101  BP: 134/72  Pulse: 79  Weight: 148 lb (67.1 kg)    Fetal Status: Fetal Heart Rate (bpm): 131 Fundal Height: 20 cm Movement: Present     General:  Alert, oriented and cooperative. Patient is in no acute distress.  Skin: Skin is warm and dry. No rash noted.   Cardiovascular: Normal heart rate noted  Respiratory: Normal respiratory effort, no problems with respiration noted  Abdomen: Soft,  gravid, appropriate for gestational age.  Pain/Pressure: Present     Pelvic: Cervical exam deferred        Extremities: Normal range of motion.  Edema: None  Mental Status: Normal mood and affect. Normal behavior. Normal judgment and thought content.   Assessment and Plan:  Pregnancy: G3P0201 at [redacted]w[redacted]d 1. [redacted] weeks gestation of pregnancy  2. Supervision of high risk pregnancy, antepartum FHT and Fh normal. Panorama Normal  3. Hypertension affecting pregnancy, antepartum BP normal ASA 81mg  On Clonidine (has tics)  4. Systemic lupus erythematosus, unspecified SLE type, unspecified organ involvement status (HCC) Currently on Prednisone 20mg  daily. No joint symptoms. Also on plaquenil  5. Bipolar affective disorder, current episode hypomanic (HCC) On Lamictal and controlled  6. Suboxone maintenance treatment complicating pregnancy, antepartum, unspecified trimester (HCC) On suboxone 2mg  BID  7. intractable N/V Phenergan suppositories. If not improved tonight or tomorrow, pt to send message or call. May need to go to [facility] for IVF and IV steroids as she is steroid dependent.  Preterm labor symptoms and general obstetric precautions including but not limited to vaginal bleeding, contractions, leaking of fluid and fetal movement were reviewed in detail with the patient. Please refer to After Visit Summary for other counseling recommendations.   Return in about 4 weeks (around 01/20/2021) for OB f/u.  Future Appointments  Date Time Provider Department Center  01/01/2021  3:00 PM [facility] NURSE [facility]  01/01/2021  3:15 PM [facility] US2 [facility]  01/22/2021 10:45 AM 03/03/2021, DO [facility] None  03/04/2021 10:40 AM 01/24/2021, NP [facility] None    Levie Heritage, DO

## 2020-12-24 ENCOUNTER — Other Ambulatory Visit: Payer: Self-pay

## 2020-12-24 DIAGNOSIS — O219 Vomiting of pregnancy, unspecified: Secondary | ICD-10-CM

## 2020-12-24 MED ORDER — PROMETHAZINE HCL 25 MG PO TABS: 25.0000 mg | ORAL_TABLET | Freq: Four times a day (QID) | ORAL | 1 refills | Status: DC | PRN

## 2020-12-28 ENCOUNTER — Other Ambulatory Visit: Payer: Self-pay

## 2020-12-29 MED ORDER — BUPRENORPHINE HCL-NALOXONE HCL 2-0.5 MG SL FILM
1.0000 | ORAL_FILM | Freq: Two times a day (BID) | SUBLINGUAL | 0 refills | Status: DC
Start: 2020-12-29 — End: 2021-01-11

## 2021-01-01 ENCOUNTER — Ambulatory Visit: Payer: Medicaid Other | Attending: Obstetrics

## 2021-01-01 ENCOUNTER — Other Ambulatory Visit: Payer: Self-pay

## 2021-01-01 ENCOUNTER — Ambulatory Visit: Payer: Medicaid Other | Admitting: *Deleted

## 2021-01-01 VITALS — BP 132/75 | HR 83

## 2021-01-01 DIAGNOSIS — Z3A22 22 weeks gestation of pregnancy: Secondary | ICD-10-CM

## 2021-01-01 DIAGNOSIS — F419 Anxiety disorder, unspecified: Secondary | ICD-10-CM

## 2021-01-01 DIAGNOSIS — O99324 Drug use complicating childbirth: Secondary | ICD-10-CM | POA: Diagnosis not present

## 2021-01-01 DIAGNOSIS — O99112 Other diseases of the blood and blood-forming organs and certain disorders involving the immune mechanism complicating pregnancy, second trimester: Secondary | ICD-10-CM | POA: Diagnosis present

## 2021-01-01 DIAGNOSIS — Z87798 Personal history of other (corrected) congenital malformations: Secondary | ICD-10-CM

## 2021-01-01 DIAGNOSIS — O10012 Pre-existing essential hypertension complicating pregnancy, second trimester: Secondary | ICD-10-CM | POA: Insufficient documentation

## 2021-01-01 DIAGNOSIS — D6862 Lupus anticoagulant syndrome: Secondary | ICD-10-CM | POA: Insufficient documentation

## 2021-01-01 DIAGNOSIS — O99891 Other specified diseases and conditions complicating pregnancy: Secondary | ICD-10-CM | POA: Diagnosis not present

## 2021-01-01 DIAGNOSIS — M329 Systemic lupus erythematosus, unspecified: Secondary | ICD-10-CM | POA: Diagnosis not present

## 2021-01-01 DIAGNOSIS — F1920 Other psychoactive substance dependence, uncomplicated: Secondary | ICD-10-CM | POA: Insufficient documentation

## 2021-01-01 DIAGNOSIS — O34219 Maternal care for unspecified type scar from previous cesarean delivery: Secondary | ICD-10-CM

## 2021-01-01 DIAGNOSIS — O99342 Other mental disorders complicating pregnancy, second trimester: Secondary | ICD-10-CM

## 2021-01-01 DIAGNOSIS — O99322 Drug use complicating pregnancy, second trimester: Secondary | ICD-10-CM | POA: Diagnosis present

## 2021-01-01 DIAGNOSIS — O09212 Supervision of pregnancy with history of pre-term labor, second trimester: Secondary | ICD-10-CM

## 2021-01-01 DIAGNOSIS — O09219 Supervision of pregnancy with history of pre-term labor, unspecified trimester: Secondary | ICD-10-CM | POA: Insufficient documentation

## 2021-01-04 ENCOUNTER — Other Ambulatory Visit: Payer: Self-pay | Admitting: *Deleted

## 2021-01-04 DIAGNOSIS — M329 Systemic lupus erythematosus, unspecified: Secondary | ICD-10-CM

## 2021-01-05 MED ORDER — OMEPRAZOLE 20 MG PO CPDR: DELAYED_RELEASE_CAPSULE | ORAL | 3 refills | Status: DC

## 2021-01-05 MED ORDER — PREDNISONE 20 MG PO TABS: 20.0000 mg | ORAL_TABLET | Freq: Every day | ORAL | 3 refills | Status: DC

## 2021-01-11 ENCOUNTER — Other Ambulatory Visit: Payer: Self-pay

## 2021-01-11 MED ORDER — BUPRENORPHINE HCL-NALOXONE HCL 2-0.5 MG SL FILM: 1.0000 | ORAL_FILM | Freq: Two times a day (BID) | SUBLINGUAL | 0 refills | Status: DC

## 2021-01-22 ENCOUNTER — Encounter: Payer: Medicaid Other | Admitting: Family Medicine

## 2021-01-22 ENCOUNTER — Telehealth: Payer: Self-pay

## 2021-01-22 DIAGNOSIS — O219 Vomiting of pregnancy, unspecified: Secondary | ICD-10-CM

## 2021-01-22 MED ORDER — PROMETHAZINE HCL 25 MG PO TABS: 25.0000 mg | ORAL_TABLET | Freq: Four times a day (QID) | ORAL | 3 refills | Status: DC | PRN

## 2021-01-22 NOTE — Telephone Encounter (Signed)
Patient called and cancelled OB appointment today. We tried to schedule her for a virtual visit today but she said it is difficult for her to log on to her my chart.   Patient requests a refill on her Suboxone strips and a refill on the phenergan tablets.  Patient made aware that there was one refill on the phenergan tablets ordered on March 24,2022- patient states she has already picked it up so she has used #60 tablets in approximately one month.  Will route to provider for refill authorizations. [name] RN

## 2021-01-22 NOTE — Telephone Encounter (Signed)
Refilled phenergan. Due for refill of Suboxone on 4/25. Will refill then.

## 2021-01-25 DIAGNOSIS — F3112 Bipolar disorder, current episode manic without psychotic features, moderate: Secondary | ICD-10-CM

## 2021-01-25 MED ORDER — BUPRENORPHINE HCL-NALOXONE HCL 2-0.5 MG SL FILM: 1.0000 | ORAL_FILM | Freq: Two times a day (BID) | SUBLINGUAL | 0 refills | Status: DC

## 2021-01-28 ENCOUNTER — Ambulatory Visit (INDEPENDENT_AMBULATORY_CARE_PROVIDER_SITE_OTHER): Payer: Medicaid Other | Admitting: Family Medicine

## 2021-01-28 ENCOUNTER — Encounter: Payer: Self-pay | Admitting: General Practice

## 2021-01-28 ENCOUNTER — Other Ambulatory Visit: Payer: Self-pay

## 2021-01-28 VITALS — BP 151/78 | HR 96 | Wt 155.0 lb

## 2021-01-28 DIAGNOSIS — F1120 Opioid dependence, uncomplicated: Secondary | ICD-10-CM

## 2021-01-28 DIAGNOSIS — O169 Unspecified maternal hypertension, unspecified trimester: Secondary | ICD-10-CM

## 2021-01-28 DIAGNOSIS — Z3480 Encounter for supervision of other normal pregnancy, unspecified trimester: Secondary | ICD-10-CM

## 2021-01-28 DIAGNOSIS — F3112 Bipolar disorder, current episode manic without psychotic features, moderate: Secondary | ICD-10-CM

## 2021-01-28 DIAGNOSIS — R87610 Atypical squamous cells of undetermined significance on cytologic smear of cervix (ASC-US): Secondary | ICD-10-CM

## 2021-01-28 DIAGNOSIS — M329 Systemic lupus erythematosus, unspecified: Secondary | ICD-10-CM

## 2021-01-28 DIAGNOSIS — R87810 Cervical high risk human papillomavirus (HPV) DNA test positive: Secondary | ICD-10-CM

## 2021-01-28 DIAGNOSIS — Z3A26 26 weeks gestation of pregnancy: Secondary | ICD-10-CM

## 2021-01-28 DIAGNOSIS — O99320 Drug use complicating pregnancy, unspecified trimester: Secondary | ICD-10-CM

## 2021-01-28 DIAGNOSIS — O99891 Other specified diseases and conditions complicating pregnancy: Secondary | ICD-10-CM

## 2021-01-28 MED ORDER — LABETALOL HCL 200 MG PO TABS: 200.0000 mg | ORAL_TABLET | Freq: Two times a day (BID) | ORAL | 3 refills | Status: DC

## 2021-01-28 MED ORDER — BUPRENORPHINE HCL-NALOXONE HCL 2-0.5 MG SL FILM: 3.0000 | ORAL_FILM | Freq: Every day | SUBLINGUAL | 0 refills | Status: DC

## 2021-01-28 NOTE — Progress Notes (Signed)
b  PRENATAL VISIT NOTE  Subjective:  Wendy Herman is a 35 y.o. G3P0201 at [redacted]w[redacted]d being seen today for ongoing prenatal care.  She is currently monitored for the following issues for this high-risk pregnancy and has Chest pain; Neutropenia (HCC); Anemia; SLE (systemic lupus erythematosus) (HCC); Discoid lupus erythematosus; Bipolar affective disorder, current episode hypomanic (HCC); Vitamin D insufficiency; Bipolar 1 disorder with moderate mania (HCC); Supervision of other normal pregnancy, antepartum; History of preterm delivery, currently pregnant; Tobacco smoking affecting pregnancy, antepartum; Hypertension affecting pregnancy, antepartum; Lupus anticoagulant affecting pregnancy, antepartum (HCC); Suboxone maintenance treatment complicating pregnancy, antepartum, unspecified trimester (HCC); ASCUS with positive high risk HPV cervical; and Systemic lupus erythematosus affecting pregnancy (HCC) on their problem list.  Patient reports increasing joint pains over the last few weeks. Otherwise doing really well. Nausea and vomiting significantly improved as long as she keeps phenergan regimen.  Contractions: Not present. Vag. Bleeding: None.  Movement: Present. Denies leaking of fluid.   The following portions of the patient's history were reviewed and updated as appropriate: allergies, current medications, past family history, past medical history, past social history, past surgical history and problem list.   Objective:   Vitals:   01/28/21 1602  BP: (!) 151/78  Pulse: 96  Weight: 155 lb (70.3 kg)    Fetal Status:     Movement: Present     General:  Alert, oriented and cooperative. Patient is in no acute distress.  Skin: Skin is warm and dry. No rash noted.   Cardiovascular: Normal heart rate noted  Respiratory: Normal respiratory effort, no problems with respiration noted  Abdomen: Soft, gravid, appropriate for gestational age.  Pain/Pressure: Present     Pelvic: Cervical exam deferred         Extremities: Normal range of motion.  Edema: None  Mental Status: Normal mood and affect. Normal behavior. Normal judgment and thought content.   Assessment and Plan:  Pregnancy: G3P0201 at [redacted]w[redacted]d 1. [redacted] weeks gestation of pregnancy  2. Supervision of high risk pregnancy, antepartum FHT and Fh normal. Return in 2 weeks for GTT, CBC, CMP, HIV and RPR   3. Hypertension affecting pregnancy, antepartum BP elevated 151/78.   ASA 81mg  On Clonidine (has tics)  4. Systemic lupus erythematosus, unspecified SLE type, unspecified organ involvement status (HCC) Currently on Prednisone 20mg  daily. Worsening joint symptoms. Also on plaquenil  5. Bipolar affective disorder, current episode hypomanic (HCC) On Lamictal and controlled  6. Suboxone maintenance treatment complicating pregnancy, antepartum, unspecified trimester (HCC) On suboxone 2mg  BID. Given increased pain, increase morning dose to 4 mg.   7. intractable N/V Phenergan suppositories helpful, able to eat regular diet   Preterm labor symptoms and general obstetric precautions including but not limited to vaginal bleeding, contractions, leaking of fluid and fetal movement were reviewed in detail with the patient. Please refer to After Visit Summary for other counseling recommendations.   No follow-ups on file.  Future Appointments  Date Time Provider Department Center  01/29/2021  3:15 PM [facility] NURSE [facility]  01/29/2021  3:30 PM [facility] US3 [facility]  03/04/2021 10:40 AM 01/31/2021, NP [facility] None    [facility] Medical Student

## 2021-01-29 ENCOUNTER — Ambulatory Visit: Payer: Medicaid Other | Admitting: *Deleted

## 2021-01-29 ENCOUNTER — Other Ambulatory Visit: Payer: Self-pay | Admitting: Obstetrics

## 2021-01-29 ENCOUNTER — Encounter: Payer: Self-pay | Admitting: *Deleted

## 2021-01-29 ENCOUNTER — Ambulatory Visit: Payer: Medicaid Other | Attending: Obstetrics

## 2021-01-29 VITALS — BP 155/79 | HR 90

## 2021-01-29 DIAGNOSIS — M329 Systemic lupus erythematosus, unspecified: Secondary | ICD-10-CM

## 2021-01-29 DIAGNOSIS — Z3A26 26 weeks gestation of pregnancy: Secondary | ICD-10-CM

## 2021-01-29 DIAGNOSIS — I10 Essential (primary) hypertension: Secondary | ICD-10-CM | POA: Insufficient documentation

## 2021-01-29 DIAGNOSIS — Z87798 Personal history of other (corrected) congenital malformations: Secondary | ICD-10-CM

## 2021-01-29 DIAGNOSIS — O99891 Other specified diseases and conditions complicating pregnancy: Secondary | ICD-10-CM | POA: Diagnosis not present

## 2021-01-29 DIAGNOSIS — O99342 Other mental disorders complicating pregnancy, second trimester: Secondary | ICD-10-CM | POA: Diagnosis not present

## 2021-01-29 DIAGNOSIS — F17210 Nicotine dependence, cigarettes, uncomplicated: Secondary | ICD-10-CM | POA: Diagnosis not present

## 2021-01-29 DIAGNOSIS — O10012 Pre-existing essential hypertension complicating pregnancy, second trimester: Secondary | ICD-10-CM

## 2021-01-29 DIAGNOSIS — O09212 Supervision of pregnancy with history of pre-term labor, second trimester: Secondary | ICD-10-CM

## 2021-01-29 DIAGNOSIS — F319 Bipolar disorder, unspecified: Secondary | ICD-10-CM | POA: Diagnosis not present

## 2021-01-29 DIAGNOSIS — O34219 Maternal care for unspecified type scar from previous cesarean delivery: Secondary | ICD-10-CM

## 2021-01-29 DIAGNOSIS — F1920 Other psychoactive substance dependence, uncomplicated: Secondary | ICD-10-CM

## 2021-01-29 DIAGNOSIS — O99013 Anemia complicating pregnancy, third trimester: Secondary | ICD-10-CM

## 2021-01-29 DIAGNOSIS — O99322 Drug use complicating pregnancy, second trimester: Secondary | ICD-10-CM | POA: Diagnosis not present

## 2021-01-29 DIAGNOSIS — O99332 Smoking (tobacco) complicating pregnancy, second trimester: Secondary | ICD-10-CM | POA: Diagnosis not present

## 2021-01-29 DIAGNOSIS — O365920 Maternal care for other known or suspected poor fetal growth, second trimester, not applicable or unspecified: Secondary | ICD-10-CM

## 2021-01-29 DIAGNOSIS — D649 Anemia, unspecified: Secondary | ICD-10-CM

## 2021-02-01 ENCOUNTER — Other Ambulatory Visit: Payer: Self-pay | Admitting: *Deleted

## 2021-02-01 DIAGNOSIS — O10912 Unspecified pre-existing hypertension complicating pregnancy, second trimester: Secondary | ICD-10-CM

## 2021-02-04 ENCOUNTER — Encounter: Payer: Self-pay | Admitting: Obstetrics & Gynecology

## 2021-02-04 ENCOUNTER — Other Ambulatory Visit: Payer: Self-pay | Admitting: Family Medicine

## 2021-02-04 DIAGNOSIS — O365920 Maternal care for other known or suspected poor fetal growth, second trimester, not applicable or unspecified: Secondary | ICD-10-CM | POA: Insufficient documentation

## 2021-02-04 MED ORDER — BUPRENORPHINE HCL-NALOXONE HCL 2-0.5 MG SL FILM: 3.0000 | ORAL_FILM | Freq: Every day | SUBLINGUAL | 0 refills | Status: DC

## 2021-02-04 MED ORDER — LAMOTRIGINE 200 MG PO TABS: 200.0000 mg | ORAL_TABLET | Freq: Every day | ORAL | 5 refills | Status: AC

## 2021-02-10 ENCOUNTER — Encounter: Payer: Medicaid Other | Admitting: Family Medicine

## 2021-02-11 NOTE — Progress Notes (Signed)
Patient canceled appt

## 2021-02-12 ENCOUNTER — Encounter: Payer: Self-pay | Admitting: *Deleted

## 2021-02-12 ENCOUNTER — Ambulatory Visit: Payer: Medicaid Other | Admitting: *Deleted

## 2021-02-12 ENCOUNTER — Other Ambulatory Visit: Payer: Self-pay

## 2021-02-12 ENCOUNTER — Ambulatory Visit (HOSPITAL_BASED_OUTPATIENT_CLINIC_OR_DEPARTMENT_OTHER): Payer: Medicaid Other | Admitting: *Deleted

## 2021-02-12 ENCOUNTER — Ambulatory Visit (HOSPITAL_BASED_OUTPATIENT_CLINIC_OR_DEPARTMENT_OTHER): Payer: Medicaid Other

## 2021-02-12 VITALS — BP 148/81 | HR 91

## 2021-02-12 DIAGNOSIS — O99333 Smoking (tobacco) complicating pregnancy, third trimester: Secondary | ICD-10-CM

## 2021-02-12 DIAGNOSIS — O10013 Pre-existing essential hypertension complicating pregnancy, third trimester: Secondary | ICD-10-CM | POA: Diagnosis not present

## 2021-02-12 DIAGNOSIS — Z3A28 28 weeks gestation of pregnancy: Secondary | ICD-10-CM

## 2021-02-12 DIAGNOSIS — F17210 Nicotine dependence, cigarettes, uncomplicated: Secondary | ICD-10-CM

## 2021-02-12 DIAGNOSIS — O10919 Unspecified pre-existing hypertension complicating pregnancy, unspecified trimester: Secondary | ICD-10-CM

## 2021-02-12 DIAGNOSIS — M329 Systemic lupus erythematosus, unspecified: Secondary | ICD-10-CM | POA: Diagnosis not present

## 2021-02-12 DIAGNOSIS — F319 Bipolar disorder, unspecified: Secondary | ICD-10-CM

## 2021-02-12 DIAGNOSIS — I10 Essential (primary) hypertension: Secondary | ICD-10-CM | POA: Insufficient documentation

## 2021-02-12 DIAGNOSIS — O365931 Maternal care for other known or suspected poor fetal growth, third trimester, fetus 1: Secondary | ICD-10-CM

## 2021-02-12 DIAGNOSIS — O99013 Anemia complicating pregnancy, third trimester: Secondary | ICD-10-CM

## 2021-02-12 DIAGNOSIS — O99891 Other specified diseases and conditions complicating pregnancy: Secondary | ICD-10-CM | POA: Diagnosis not present

## 2021-02-12 DIAGNOSIS — O10912 Unspecified pre-existing hypertension complicating pregnancy, second trimester: Secondary | ICD-10-CM | POA: Insufficient documentation

## 2021-02-12 DIAGNOSIS — D649 Anemia, unspecified: Secondary | ICD-10-CM

## 2021-02-12 DIAGNOSIS — F1920 Other psychoactive substance dependence, uncomplicated: Secondary | ICD-10-CM

## 2021-02-12 DIAGNOSIS — O09213 Supervision of pregnancy with history of pre-term labor, third trimester: Secondary | ICD-10-CM | POA: Diagnosis not present

## 2021-02-12 DIAGNOSIS — Z6828 Body mass index (BMI) 28.0-28.9, adult: Secondary | ICD-10-CM

## 2021-02-12 DIAGNOSIS — O365930 Maternal care for other known or suspected poor fetal growth, third trimester, not applicable or unspecified: Secondary | ICD-10-CM

## 2021-02-12 DIAGNOSIS — O99343 Other mental disorders complicating pregnancy, third trimester: Secondary | ICD-10-CM

## 2021-02-12 DIAGNOSIS — Z87798 Personal history of other (corrected) congenital malformations: Secondary | ICD-10-CM

## 2021-02-12 DIAGNOSIS — O99323 Drug use complicating pregnancy, third trimester: Secondary | ICD-10-CM

## 2021-02-12 DIAGNOSIS — O34219 Maternal care for unspecified type scar from previous cesarean delivery: Secondary | ICD-10-CM | POA: Diagnosis not present

## 2021-02-12 NOTE — Procedures (Signed)
Wendy Herman Jan 17, 1986 [redacted]w[redacted]d  Fetus A Non-Stress Test Interpretation for 02/12/21  Indication: Chronic Hypertenstion  Fetal Heart Rate A Mode: External Baseline Rate (A): 135 bpm Variability: Moderate Accelerations: None (FAS utilized) Decelerations: None Multiple birth?: No  Uterine Activity Mode: Palpation,Toco Contraction Frequency (min): none Resting Tone Palpated: Relaxed Resting Time: Adequate  Interpretation (Fetal Testing) Nonstress Test Interpretation: Non-reactive Comments: Dr. Judeth Cornfield reviewed tracing. Patient proceeded to U/S as ordered.

## 2021-02-13 ENCOUNTER — Encounter (HOSPITAL_COMMUNITY): Payer: Self-pay | Admitting: Obstetrics & Gynecology

## 2021-02-13 ENCOUNTER — Other Ambulatory Visit: Payer: Self-pay

## 2021-02-13 ENCOUNTER — Inpatient Hospital Stay (HOSPITAL_COMMUNITY)
Admission: AD | Admit: 2021-02-13 | Discharge: 2021-02-14 | DRG: 832 | Disposition: A | Discharge status: Home / Self Care | Payer: Medicaid Other | Attending: Obstetrics & Gynecology | Admitting: Obstetrics & Gynecology

## 2021-02-13 DIAGNOSIS — Z20822 Contact with and (suspected) exposure to covid-19: Secondary | ICD-10-CM | POA: Diagnosis not present

## 2021-02-13 DIAGNOSIS — O368130 Decreased fetal movements, third trimester, not applicable or unspecified: Secondary | ICD-10-CM | POA: Diagnosis not present

## 2021-02-13 DIAGNOSIS — F17210 Nicotine dependence, cigarettes, uncomplicated: Secondary | ICD-10-CM | POA: Diagnosis present

## 2021-02-13 DIAGNOSIS — F1920 Other psychoactive substance dependence, uncomplicated: Secondary | ICD-10-CM | POA: Diagnosis present

## 2021-02-13 DIAGNOSIS — O99891 Other specified diseases and conditions complicating pregnancy: Secondary | ICD-10-CM | POA: Diagnosis not present

## 2021-02-13 DIAGNOSIS — O10013 Pre-existing essential hypertension complicating pregnancy, third trimester: Secondary | ICD-10-CM | POA: Diagnosis not present

## 2021-02-13 DIAGNOSIS — O23593 Infection of other part of genital tract in pregnancy, third trimester: Secondary | ICD-10-CM | POA: Diagnosis not present

## 2021-02-13 DIAGNOSIS — O99333 Smoking (tobacco) complicating pregnancy, third trimester: Secondary | ICD-10-CM | POA: Diagnosis not present

## 2021-02-13 DIAGNOSIS — Z3A28 28 weeks gestation of pregnancy: Secondary | ICD-10-CM

## 2021-02-13 DIAGNOSIS — R319 Hematuria, unspecified: Secondary | ICD-10-CM | POA: Diagnosis not present

## 2021-02-13 DIAGNOSIS — O99321 Drug use complicating pregnancy, first trimester: Secondary | ICD-10-CM | POA: Diagnosis present

## 2021-02-13 DIAGNOSIS — O365930 Maternal care for other known or suspected poor fetal growth, third trimester, not applicable or unspecified: Secondary | ICD-10-CM | POA: Diagnosis not present

## 2021-02-13 DIAGNOSIS — B9689 Other specified bacterial agents as the cause of diseases classified elsewhere: Secondary | ICD-10-CM | POA: Diagnosis not present

## 2021-02-13 DIAGNOSIS — M329 Systemic lupus erythematosus, unspecified: Secondary | ICD-10-CM | POA: Diagnosis not present

## 2021-02-13 DIAGNOSIS — O4693 Antepartum hemorrhage, unspecified, third trimester: Secondary | ICD-10-CM | POA: Diagnosis not present

## 2021-02-13 DIAGNOSIS — O365920 Maternal care for other known or suspected poor fetal growth, second trimester, not applicable or unspecified: Secondary | ICD-10-CM

## 2021-02-13 DIAGNOSIS — O4690 Antepartum hemorrhage, unspecified, unspecified trimester: Secondary | ICD-10-CM | POA: Diagnosis present

## 2021-02-13 LAB — COMPREHENSIVE METABOLIC PANEL
ALT: 26 U/L (ref 0–44)
AST: 45 U/L — ABNORMAL HIGH (ref 15–41)
Albumin: 3.2 g/dL — ABNORMAL LOW (ref 3.5–5.0)
Alkaline Phosphatase: 85 U/L (ref 38–126)
Anion gap: 8 (ref 5–15)
BUN: 10 mg/dL (ref 6–20)
CO2: 20 mmol/L — ABNORMAL LOW (ref 22–32)
Calcium: 8.9 mg/dL (ref 8.9–10.3)
Chloride: 107 mmol/L (ref 98–111)
Creatinine, Ser: 0.72 mg/dL (ref 0.44–1.00)
GFR, Estimated: >60 mL/min (ref >60)
Glucose, Bld: 87 mg/dL (ref 70–99)
Potassium: 4.6 mmol/L (ref 3.5–5.1)
Sodium: 135 mmol/L (ref 135–145)
Total Bilirubin: 0.4 mg/dL (ref 0.3–1.2)
Total Protein: 5.8 g/dL — ABNORMAL LOW (ref 6.5–8.1)

## 2021-02-13 LAB — TYPE AND SCREEN
ABO/RH(D): A POS
Antibody Screen: NEGATIVE

## 2021-02-13 LAB — URINALYSIS, ROUTINE W REFLEX MICROSCOPIC
Bacteria, UA: NONE SEEN
Bilirubin Urine: NEGATIVE
Glucose, UA: NEGATIVE mg/dL
Ketones, ur: NEGATIVE mg/dL
Nitrite: NEGATIVE
Protein, ur: 100 mg/dL — AB
RBC / HPF: >50 RBC/hpf — ABNORMAL HIGH (ref 0–5)
Specific Gravity, Urine: 1.012 (ref 1.005–1.030)
WBC, UA: >50 WBC/hpf — ABNORMAL HIGH (ref 0–5)
pH: 6 (ref 5.0–8.0)

## 2021-02-13 LAB — WET PREP, GENITAL
Sperm: NONE SEEN
Trich, Wet Prep: NONE SEEN
Yeast Wet Prep HPF POC: NONE SEEN

## 2021-02-13 LAB — CBC
HCT: 34.6 % — ABNORMAL LOW (ref 36.0–46.0)
Hemoglobin: 11.3 g/dL — ABNORMAL LOW (ref 12.0–15.0)
MCH: 29.7 pg (ref 26.0–34.0)
MCHC: 32.7 g/dL (ref 30.0–36.0)
MCV: 90.8 fL (ref 80.0–100.0)
Platelets: 243 10*3/uL (ref 150–400)
RBC: 3.81 MIL/uL — ABNORMAL LOW (ref 3.87–5.11)
RDW: 13.3 % (ref 11.5–15.5)
WBC: 9.2 10*3/uL (ref 4.0–10.5)
nRBC: 0 % (ref 0.0–0.2)

## 2021-02-13 LAB — RAPID URINE DRUG SCREEN, HOSP PERFORMED
Amphetamines: NOT DETECTED
Barbiturates: NOT DETECTED
Benzodiazepines: NOT DETECTED
Cocaine: NOT DETECTED
Opiates: NOT DETECTED
Tetrahydrocannabinol: POSITIVE — AB

## 2021-02-13 LAB — RESP PANEL BY RT-PCR (FLU A&B, COVID) ARPGX2
Influenza A by PCR: NEGATIVE
Influenza B by PCR: NEGATIVE
SARS Coronavirus 2 by RT PCR: NEGATIVE

## 2021-02-13 LAB — POCT FERN TEST: POCT Fern Test: POSITIVE

## 2021-02-13 MED ORDER — SODIUM CHLORIDE 0.9% FLUSH
3.0000 mL | Freq: Two times a day (BID) | INTRAVENOUS | Status: DC
  Administered 2021-02-13: 3 mL via INTRAVENOUS

## 2021-02-13 MED ORDER — BUPRENORPHINE HCL-NALOXONE HCL 2-0.5 MG SL SUBL
2.0000 | SUBLINGUAL_TABLET | Freq: Every day | SUBLINGUAL | Status: DC
  Administered 2021-02-13 – 2021-02-14 (×2): 2 via SUBLINGUAL
  Filled 2021-02-13 (×2): qty 2

## 2021-02-13 MED ORDER — DOCUSATE SODIUM 100 MG PO CAPS
100.0000 mg | ORAL_CAPSULE | Freq: Every day | ORAL | Status: DC
  Administered 2021-02-13 – 2021-02-14 (×2): 100 mg via ORAL
  Filled 2021-02-13 (×2): qty 1

## 2021-02-13 MED ORDER — PANTOPRAZOLE SODIUM 40 MG PO TBEC
40.0000 mg | DELAYED_RELEASE_TABLET | Freq: Every day | ORAL | Status: DC
  Administered 2021-02-13 – 2021-02-14 (×2): 40 mg via ORAL
  Filled 2021-02-13 (×2): qty 1

## 2021-02-13 MED ORDER — HYDROXYCHLOROQUINE SULFATE 200 MG PO TABS
400.0000 mg | ORAL_TABLET | Freq: Every day | ORAL | Status: DC
  Administered 2021-02-13 – 2021-02-14 (×2): 400 mg via ORAL
  Filled 2021-02-13 (×2): qty 2

## 2021-02-13 MED ORDER — ZOLPIDEM TARTRATE 5 MG PO TABS: 5.0000 mg | ORAL_TABLET | Freq: Every evening | ORAL | Status: DC | PRN

## 2021-02-13 MED ORDER — LACTATED RINGERS IV BOLUS
1000.0000 mL | Freq: Once | INTRAVENOUS | Status: AC
  Administered 2021-02-13: 1000 mL via INTRAVENOUS

## 2021-02-13 MED ORDER — CALCIUM CARBONATE ANTACID 500 MG PO CHEW: 2.0000 | CHEWABLE_TABLET | ORAL | Status: DC | PRN

## 2021-02-13 MED ORDER — OLANZAPINE 10 MG PO TABS
20.0000 mg | ORAL_TABLET | Freq: Every day | ORAL | Status: DC
  Filled 2021-02-13 (×2): qty 2

## 2021-02-13 MED ORDER — METOCLOPRAMIDE HCL 10 MG PO TABS
10.0000 mg | ORAL_TABLET | Freq: Four times a day (QID) | ORAL | Status: DC | PRN
  Administered 2021-02-13 – 2021-02-14 (×3): 10 mg via ORAL
  Filled 2021-02-13 (×3): qty 1

## 2021-02-13 MED ORDER — PREDNISONE 20 MG PO TABS
20.0000 mg | ORAL_TABLET | Freq: Every day | ORAL | Status: DC
Start: 2021-02-13 — End: 2021-02-14
  Administered 2021-02-13 – 2021-02-14 (×2): 20 mg via ORAL
  Filled 2021-02-13 (×2): qty 1

## 2021-02-13 MED ORDER — BUPRENORPHINE HCL 2 MG SL SUBL: 4.0000 mg | SUBLINGUAL_TABLET | Freq: Every day | SUBLINGUAL | Status: DC

## 2021-02-13 MED ORDER — ASPIRIN EC 81 MG PO TBEC
81.0000 mg | DELAYED_RELEASE_TABLET | Freq: Every day | ORAL | Status: DC
  Administered 2021-02-13 – 2021-02-14 (×2): 81 mg via ORAL
  Filled 2021-02-13 (×2): qty 1

## 2021-02-13 MED ORDER — ACETAMINOPHEN 325 MG PO TABS: 650.0000 mg | ORAL_TABLET | ORAL | Status: DC | PRN

## 2021-02-13 MED ORDER — PRENATAL MULTIVITAMIN CH
1.0000 | ORAL_TABLET | Freq: Every day | ORAL | Status: DC
  Administered 2021-02-13 – 2021-02-14 (×2): 1 via ORAL
  Filled 2021-02-13 (×2): qty 1

## 2021-02-13 MED ORDER — CLONIDINE HCL 0.2 MG PO TABS
0.2000 mg | ORAL_TABLET | Freq: Every day | ORAL | Status: DC
  Administered 2021-02-13 – 2021-02-14 (×2): 0.2 mg via ORAL
  Filled 2021-02-13 (×2): qty 1

## 2021-02-13 MED ORDER — LABETALOL HCL 200 MG PO TABS
200.0000 mg | ORAL_TABLET | Freq: Two times a day (BID) | ORAL | Status: DC
  Administered 2021-02-13 – 2021-02-14 (×3): 200 mg via ORAL
  Filled 2021-02-13 (×3): qty 1

## 2021-02-13 MED ORDER — CLONIDINE HCL 0.2 MG PO TABS: 0.2000 mg | ORAL_TABLET | Freq: Every day | ORAL | Status: DC

## 2021-02-13 MED ORDER — LAMOTRIGINE 100 MG PO TABS
200.0000 mg | ORAL_TABLET | Freq: Every day | ORAL | Status: DC
  Administered 2021-02-13 – 2021-02-14 (×2): 200 mg via ORAL
  Filled 2021-02-13 (×2): qty 2

## 2021-02-13 MED ORDER — PROMETHAZINE HCL 25 MG PO TABS
25.0000 mg | ORAL_TABLET | Freq: Four times a day (QID) | ORAL | Status: DC | PRN
  Administered 2021-02-13 – 2021-02-14 (×4): 25 mg via ORAL
  Filled 2021-02-13 (×4): qty 1

## 2021-02-13 MED ORDER — BUPRENORPHINE HCL-NALOXONE HCL 2-0.5 MG SL SUBL
1.0000 | SUBLINGUAL_TABLET | Freq: Every day | SUBLINGUAL | Status: DC
  Administered 2021-02-13: 1 via SUBLINGUAL
  Filled 2021-02-13: qty 1

## 2021-02-13 NOTE — H&P (Signed)
Wendy Herman is a 35 y.o. female, G3P0201 at 28.3 weeks, presenting for DFM.  Patient was seen in MFM on 5/13.  She was instructed to report for monitoring after NRNST and abnormal dopplers in setting of IUGR. BPP was 8/10 at that time and infant in 10%ile on April 29. Patient reports that upon arrival she had bleeding when trying to urinate.  Patient also reports that she had sexual activity earlier and she frequently has bleeding after sex.  Patient endorses fetal movement and reports occasional contractions, but none currently.  Patient denies current pain and is taking suboxone daily. Patient expresses concerns with recent MFM visit and infant size.    Nurses state that they collected sample of bloody discharge for fern and it returned positive upon drying.    Patient Active Problem List   Diagnosis Date Noted  . Vaginal bleeding in pregnancy 02/13/2021  . IUGR (intrauterine growth restriction) affecting care of mother, second trimester 02/04/2021  . Systemic lupus erythematosus affecting pregnancy (HCC) 10/08/2020  . ASCUS with positive high risk HPV cervical 10/01/2020  . Supervision of high-risk pregnancy 09/23/2020  . History of preterm delivery, currently pregnant 09/23/2020  . Tobacco smoking affecting pregnancy, antepartum 09/23/2020  . Hypertension affecting pregnancy, antepartum 09/23/2020  . Lupus anticoagulant affecting pregnancy, antepartum (HCC) 09/23/2020  . Suboxone maintenance treatment complicating pregnancy, antepartum, unspecified trimester (HCC) 09/23/2020  . Bipolar 1 disorder with moderate mania (HCC) 04/27/2020  . Vitamin D insufficiency 10/21/2019  . 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 Bipolar affective disorder, current episode hypomanic (HCC) 12/10/2018  . Chest pain 08/27/2018  . Neutropenia (HCC) 08/27/2018  . Anemia 08/27/2018  . SLE (systemic lupus erythematosus) (HCC) 08/27/2018    History of present pregnancy:  Last evaluation:  02/12/2021 at  MFM    Nursing Staff Provider  Office Location  [facility] Dating  LMP c/w 8wk [place]  Language   Anatomy US    Flu Vaccine   Genetic Screen  NIPS: low risk  AFP: neg    TDaP Vaccine    Hgb A1C or  GTT Early  Third trimester   COVID Vaccine    LAB RESULTS   Rhogam   Blood Type --/--/A POS Performed at [facility], [address], [place], [place] [address]  (12/12 2207)   Feeding Plan  Antibody    Contraception  Rubella    Circumcision  RPR     Pediatrician   HBsAg     Support Person  HCVAb   Prenatal Classes  HIV       BTL Consent  GBS   (For PCN allergy, check sensitivities)   VBAC Consent  Pap     Hgb Electro    BP Cuff  CF     SMA     Waterbirth  [ ]  Class [ ]  Consent [ ]  CNM visit    Induction  [ ]  Orders Entered [ ] Foley Y/N      OB History    Gravida  3   Para  2   Term      Preterm  2   AB  0   Living  1     SAB      IAB      Ectopic      Multiple      Live Births  2        Obstetric Comments  #2:C/s "her lupus was attacking the baby"  (CA) #1  Past Medical History:  Diagnosis Date  . Anxiety   . Arthritis   . Bipolar 1 disorder (HCC)   . Bipolar disorder (HCC)   . Chronic pain   . Depression   . Fibromyalgia   . Hypertension    'clonidine" for that  . Infection    UTI  . Lupus (HCC)   . Neutropenia (HCC)   . Suboxone maintenance treatment complicating pregnancy, antepartum [facility])    Past Surgical History:  Procedure Laterality Date  . CESAREAN SECTION    . WISDOM TOOTH EXTRACTION     Family History: family history includes Breast cancer in her maternal aunt; Depression in her father; Diabetes in her maternal aunt, maternal aunt, maternal grandfather, and maternal grandmother; Heart attack in her father; Heart disease in her maternal grandfather; Hypertension in her mother; Lupus in her maternal grandmother; Pancreatic cancer in her maternal uncle; Rheum arthritis in her maternal grandmother. Social History:   reports that she has been smoking cigarettes. She has a 3.75 pack-year smoking history. She has never used smokeless tobacco. She reports current drug use. Drug: Marijuana. She reports that she does not drink alcohol.   Prenatal Transfer Tool  Maternal Diabetes: Not evaluated Genetic Screening: Normal Maternal Ultrasounds/Referrals: IUGR Fetal Ultrasounds or other Referrals:  None Maternal Substance Abuse:  Yes:  Type: Prescription drugs Significant Maternal Medications:  Meds include: Other: Suboxone, Plaquenil, Labetalol, Lamictal, Clonidine Significant Maternal Lab Results: None   Maternal Assessment:  ROS: -Contractions, -LOF, -Vaginal Bleeding, +Fetal Movement  All other systems reviewed and negative.    Allergies  Allergen Reactions  . Sulfa Antibiotics Swelling    Mouth/facial swelling  . Codeine Swelling    "not actually allergic, just doesn't want to take it  . Nsaids Other (See Comments)    Interacts with GERD       Last menstrual period 07/29/2020.  Physical Exam Constitutional:      Appearance: Normal appearance.  HENT:     Head: Normocephalic and atraumatic.  Eyes:     Conjunctiva/sclera: Conjunctivae normal.  Cardiovascular:     Rate and Rhythm: Normal rate.  Pulmonary:     Effort: Pulmonary effort is normal. No respiratory distress.  Abdominal:     Palpations: Abdomen is soft.     Tenderness: There is no abdominal tenderness.  Genitourinary:    Urethra: No urethral swelling or urethral lesion.     Comments: Speculum Exam: -Normal External Genitalia: Non tender, no apparent discharge or blood at introitus. Urethra appears normal and without trauma -Vaginal Vault: Pink mucosa with good rugae. Scant amt curdy white discharge in vault.  No blood or watery discharge noted. -wet prep collected -Cervix:Pink, no lesions, cysts, or polyps.  Appears closed. No active bleeding or leaking from os-GC/CT collected -Bimanual Exam:  Closed/  Thick/Ballotable  Musculoskeletal:     Cervical back: Normal range of motion.  Skin:    Findings: Rash present.  Neurological:     Mental Status: She is alert and oriented to person, place, and time.  Psychiatric:        Mood and Affect: Mood normal.        Thought Content: Thought content normal.     Fetal Assessment: FHR: 145 bpm, Mod Var, -Decels, -Accels UCs:  None graphed    Assessment IUP at 28.3 weeks Bleeding: Vaginal Vs Urethral  NRNST Fern Positive-Likely False H/O C/S CHTN Lupus   Plan: -Dr. Katharine Look consulted and agrees with admission for observation; *Advised to not perform  Amnisure as blood can cause false positive *Will hold off on antibiotic treatment at current.  -Patient informed of plan and is agreeable. -States she would like to speak with MD and informed MD will come in AM to address all questions/concerns. -Orders to be placed by Dr. Shela Commons. Charlotta Newton  Joellyn Quails, MSN 02/13/2021, 1:16 AM   Reassessment (1:43 AM) -Nurse states patient unable to collect UA for drug screen and suggest straight cath. -Provider agreeable. -Nurse returns stating that copious amounts of blood and clots obtained from straight cath.  -Will send for further evaluation.  Cherre Robins MSN, CNM Advanced Practice Provider, [facility] [employer]

## 2021-02-13 NOTE — MAU Note (Signed)
Pt reports decreased movement, in office today pt did NST and was nonreactive, told to come to MAU today for FU and reported here now. Pt was leaving urine sample in bathroom and had some bright red bleeding in toilet and spots on ground. Reports bleeding after sex during pregnancy. Had intercourse earlier this evening.

## 2021-02-13 NOTE — Progress Notes (Signed)
FACULTY PRACTICE ANTEPARTUM(COMPREHENSIVE) NOTE  Wendy Herman is a 35 y.o. [id number] with Estimated Date of Delivery: 05/05/21   By  best clinical estimate [redacted]w[redacted]d  who is admitted for vaginal bleeding.  Fetal presentation is cephalic. Length of Stay:  0  Days  Date of admission:02/13/2021  Subjective: Pt states that she is doing better today.   Patient reports the fetal movement as active. Patient reports uterine contraction  activity as none. Patient reports  vaginal bleeding as notes some spotting with voiding. Patient describes fluid per vagina as None.  Vitals:  Blood pressure 115/60, pulse 87, temperature 98.2 F (36.8 C), temperature source Oral, resp. rate 16, height 5\' 4"  (1.626 m), weight 78.3 kg, last menstrual period 07/29/2020, SpO2 97 %. Vitals:   02/13/21 0226 02/13/21 0439  BP: 132/65 115/60  Pulse: 91 87  Resp: 16 16  Temp: 98.6 F (37 C) 98.2 F (36.8 C)  TempSrc: Oral Oral  SpO2: 98% 97%  Weight: 78.3 kg   Height: 5\' 4"  (1.626 m)    Physical Examination:  General appearance - alert, well appearing, and in no distress Chest - clear to auscultation, no wheezes, rales or rhonchi, symmetric air entry Heart - normal rate and regular rhythm Abdomen - gravid, soft and non-tender Extremities - no calf tenderness Skin - warm and dry  Fetal Monitoring:  Baseline: 135 bpm, Variability: moderate and Accelerations: +10x10 accels, no decels   Appropriate for gestational age  Labs:  Results for orders placed or performed during the hospital encounter of 02/13/21 (from the past 24 hour(s))  Fern Test   Collection Time: 02/13/21 12:55 AM  Result Value Ref Range   POCT Fern Test Positive = ruptured amniotic membanes   Comprehensive metabolic panel   Collection Time: 02/13/21  1:15 AM  Result Value Ref Range   Sodium 135 135 - 145 mmol/L   Potassium 4.6 3.5 - 5.1 mmol/L   Chloride 107 98 - 111 mmol/L   CO2 20 (L) 22 - 32 mmol/L   Glucose, Bld 87 70 - 99 mg/dL   BUN 10  6 - 20 mg/dL   Creatinine, Ser 02/15/21 0.44 - 1.00 mg/dL   Calcium 8.9 8.9 - 02/15/21 mg/dL   Total Protein 5.8 (L) 6.5 - 8.1 g/dL   Albumin 3.2 (L) 3.5 - 5.0 g/dL   AST 45 (H) 15 - 41 U/L   ALT 26 0 - 44 U/L   Alkaline Phosphatase 85 38 - 126 U/L   Total Bilirubin 0.4 0.3 - 1.2 mg/dL   GFR, Estimated 4.54 09.8 mL/min   Anion gap 8 5 - 15  Type and screen [facility]   Collection Time: 02/13/21  1:15 AM  Result Value Ref Range   ABO/RH(D) A POS    Antibody Screen NEG    Sample Expiration      02/16/2021,2359 Performed at [facility], [address], [place], [address] [place]   Rapid urine drug screen (hospital performed)   Collection Time: 02/13/21  1:30 AM  Result Value Ref Range   Opiates NONE DETECTED NONE DETECTED   Cocaine NONE DETECTED NONE DETECTED   Benzodiazepines NONE DETECTED NONE DETECTED   Amphetamines NONE DETECTED NONE DETECTED   Tetrahydrocannabinol POSITIVE (A) NONE DETECTED   Barbiturates NONE DETECTED NONE DETECTED  CBC   Collection Time: 02/13/21  1:32 AM  Result Value Ref Range   WBC 9.2 4.0 - 10.5 K/uL   RBC 3.81 (L) 3.87 - 5.11  MIL/uL   Hemoglobin 11.3 (L) 12.0 - 15.0 g/dL   HCT 45.6 (L) 25.6 - 38.9 %   MCV 90.8 80.0 - 100.0 fL   MCH 29.7 26.0 - 34.0 pg   MCHC 32.7 30.0 - 36.0 g/dL   RDW 37.3 42.8 - 76.8 %   Platelets 243 150 - 400 K/uL   nRBC 0.0 0.0 - 0.2 %  Wet prep, genital   Collection Time: 02/13/21  1:37 AM  Result Value Ref Range   Yeast Wet Prep HPF POC NONE SEEN NONE SEEN   Trich, Wet Prep NONE SEEN NONE SEEN   Clue Cells Wet Prep HPF POC PRESENT (A) NONE SEEN   WBC, Wet Prep HPF POC MODERATE (A) NONE SEEN   Sperm NONE SEEN   Urinalysis, Routine w reflex microscopic Urine, Catheterized   Collection Time: 02/13/21  1:49 AM  Result Value Ref Range   Color, Urine AMBER (A) YELLOW   APPearance HAZY (A) CLEAR   Specific Gravity, Urine 1.012 1.005 - 1.030   pH 6.0 5.0 - 8.0   Glucose, UA NEGATIVE NEGATIVE mg/dL   Hgb  urine dipstick LARGE (A) NEGATIVE   Bilirubin Urine NEGATIVE NEGATIVE   Ketones, ur NEGATIVE NEGATIVE mg/dL   Protein, ur 115 (A) NEGATIVE mg/dL   Nitrite NEGATIVE NEGATIVE   Leukocytes,Ua TRACE (A) NEGATIVE   RBC / HPF >50 (H) 0 - 5 RBC/hpf   WBC, UA >50 (H) 0 - 5 WBC/hpf   Bacteria, UA NONE SEEN NONE SEEN  Resp Panel by RT-PCR (Flu A&B, Covid) Urine, Catheterized   Collection Time: 02/13/21  1:51 AM   Specimen: Urine, Catheterized; Nasopharyngeal(NP) swabs in vial transport medium  Result Value Ref Range   SARS Coronavirus 2 by RT PCR NEGATIVE NEGATIVE   Influenza A by PCR NEGATIVE NEGATIVE   Influenza B by PCR NEGATIVE NEGATIVE     Medications:  Scheduled . aspirin EC  81 mg Oral Daily  . buprenorphine-naloxone  1 tablet Sublingual Daily  . buprenorphine-naloxone  2 tablet Sublingual Daily  . cloNIDine  0.2 mg Oral QHS  . docusate sodium  100 mg Oral Daily  . hydroxychloroquine  400 mg Oral Daily  . labetalol  200 mg Oral BID  . lamoTRIgine  200 mg Oral Daily  . OLANZapine  20 mg Oral QHS  . pantoprazole  40 mg Oral Daily  . predniSONE  20 mg Oral Daily  . prenatal multivitamin  1 tablet Oral Q1200   I have reviewed the patient's current medications.  ASSESSMENT/PLAN: [id number] [redacted]w[redacted]d Estimated Date of Delivery: 05/05/21 admitted due to vaginal bleeding 1) Vaginal bleeding:  -bleeding has improved, will continue to monitor  2) Fetal well being -reassuring for gestational age -pending length of stay and/or clinical indication- may consider repeat BPP -next [place] scheduled with MFM  3) Maternal well being -Lupus- continue home steroid dose -polysubstance abuse- continue suboxone -cHTN- continue Labetalol 200mg  bid, start ASA daily -prior C-section x 1  DISPO: Due to vaginal bleeding- plan for 5-7 day in-house monitoring  02/13/2021,8:25 AM

## 2021-02-14 DIAGNOSIS — O365930 Maternal care for other known or suspected poor fetal growth, third trimester, not applicable or unspecified: Secondary | ICD-10-CM | POA: Diagnosis not present

## 2021-02-14 DIAGNOSIS — Z3A28 28 weeks gestation of pregnancy: Secondary | ICD-10-CM

## 2021-02-14 DIAGNOSIS — O4693 Antepartum hemorrhage, unspecified, third trimester: Secondary | ICD-10-CM

## 2021-02-14 MED ORDER — LIDOCAINE VISCOUS HCL 2 % MT SOLN: 15.0000 mL | OROMUCOSAL | 0 refills | Status: DC | PRN

## 2021-02-14 MED ORDER — METRONIDAZOLE 500 MG PO TABS: 500.0000 mg | ORAL_TABLET | Freq: Two times a day (BID) | ORAL | 0 refills | Status: DC

## 2021-02-14 MED ORDER — CEPHALEXIN 500 MG PO CAPS: 500.0000 mg | ORAL_CAPSULE | Freq: Four times a day (QID) | ORAL | 2 refills | Status: DC

## 2021-02-14 MED ORDER — METHYLPREDNISOLONE ACETATE 80 MG/ML IJ SUSP
80.0000 mg | Freq: Once | INTRAMUSCULAR | Status: AC
  Administered 2021-02-14: 80 mg via INTRAMUSCULAR
  Filled 2021-02-14: qty 1

## 2021-02-14 NOTE — Discharge Summary (Signed)
Patient ID: TURQUOISE ESCH MRN: [id number] DOB/AGE: August 25, 1986 35 y.o.  Admit date: 02/13/2021 Discharge date: 02/14/2021  Admission Diagnoses:[redacted]w[redacted]d IUGR, vaginal bleeding  Discharge Diagnoses: [redacted]w[redacted]d Hematuria  Prenatal Procedures: ultrasound  Consults: Neonatology, Maternal Fetal Medicine  Hospital Course:  This is a 35 y.o. Wendy Herman with IUP at [redacted]w[redacted]d admitted for observation for presumed vaginal bleeding which upon evaluation was hematuria. She has IUGR and elevated cord dopplers and was sent from MFM for extended monitoring 5/13. She noted blood in her urine and large blood was in her UA. No vaginal bleeding was seen on exam but she was admitted for observation for bleeding. She had dysuria and vaginal odor. BV was noted on testing. Marland Kitchen  She was observed, fetal heart rate monitoring remained reassuring, and she had no signs/symptoms of  preterm labor or other maternal-fetal concerns. She was deemed stable for discharge to home with outpatient follow up.She will strain her urine for possible stones, f/u at [facility] 5/18 and MFM 5/20  Discharge Exam: Temp:  [98 F (36.7 C)-98.6 F (37 C)] 98.2 F (36.8 C) (05/15 0504) Pulse Rate:  [77-90] 84 (05/15 0504) Resp:  [17-19] 18 (05/15 0504) BP: (125-151)/(72-89) 136/84 (05/15 0504) SpO2:  [98 %-100 %] 98 % (05/15 0504) Physical Examination: CONSTITUTIONAL: Well-developed, well-nourished female in no acute distress.  HENT:  Normocephalic, atraumatic, External right and left ear normal. Oropharynx is clear and moist EYES: Conjunctivae and EOM are normal. Pupils are equal, round, and reactive to light. No scleral icterus.  NECK: Normal range of motion, supple, no masses SKIN: Skin is warm and dry. No rash noted. Not diaphoretic. No erythema. No pallor. NEUROLGIC: Alert and oriented to person, place, and time. Normal reflexes, muscle tone coordination. No cranial nerve deficit noted. PSYCHIATRIC: Normal mood and affect. Normal behavior. Normal  judgment and thought content. CARDIOVASCULAR: Normal heart rate noted, regular rhythm RESPIRATORY: Effort and breath sounds normal, no problems with respiration noted MUSCULOSKELETAL: Normal range of motion. No edema and no tenderness. 2+ distal pulses. ABDOMEN: Soft, nontender, nondistended, gravid. CERVIX: Dilation: Closed Effacement (%): Thick Exam by:: Sabas Sous, CNM  Fetal Heart Rate A   Mode External filed at 02/14/2021 0553  Baseline Rate (A) 140 bpm filed at 02/14/2021 0553  Variability <5 BPM, 6-25 BPM filed at 02/14/2021 0553  Accelerations None filed at 02/14/2021 0553  Decelerations Variable filed at 02/14/2021 0553     Significant Diagnostic Studies:  Results for orders placed or performed during the hospital encounter of 02/13/21 (from the past 168 hour(s))  Fern Test   Collection Time: 02/13/21 12:55 AM  Result Value Ref Range   POCT Fern Test Positive = ruptured amniotic membanes   Comprehensive metabolic panel   Collection Time: 02/13/21  1:15 AM  Result Value Ref Range   Sodium 135 135 - 145 mmol/L   Potassium 4.6 3.5 - 5.1 mmol/L   Chloride 107 98 - 111 mmol/L   CO2 20 (L) 22 - 32 mmol/L   Glucose, Bld 87 70 - 99 mg/dL   BUN 10 6 - 20 mg/dL   Creatinine, Ser 9.76 0.44 - 1.00 mg/dL   Calcium 8.9 8.9 - 73.4 mg/dL   Total Protein 5.8 (L) 6.5 - 8.1 g/dL   Albumin 3.2 (L) 3.5 - 5.0 g/dL   AST 45 (H) 15 - 41 U/L   ALT 26 0 - 44 U/L   Alkaline Phosphatase 85 38 - 126 U/L   Total Bilirubin 0.4 0.3 - 1.2 mg/dL  GFR, Estimated >60 >60 mL/min   Anion gap 8 5 - 15  Type and screen [facility]   Collection Time: 02/13/21  1:15 AM  Result Value Ref Range   ABO/RH(D) A POS    Antibody Screen NEG    Sample Expiration      02/16/2021,2359 Performed at [facility], [address], [place], [place] [address]   Rapid urine drug screen (hospital performed)   Collection Time: 02/13/21  1:30 AM  Result Value Ref Range   Opiates NONE DETECTED  NONE DETECTED   Cocaine NONE DETECTED NONE DETECTED   Benzodiazepines NONE DETECTED NONE DETECTED   Amphetamines NONE DETECTED NONE DETECTED   Tetrahydrocannabinol POSITIVE (A) NONE DETECTED   Barbiturates NONE DETECTED NONE DETECTED  CBC   Collection Time: 02/13/21  1:32 AM  Result Value Ref Range   WBC 9.2 4.0 - 10.5 K/uL   RBC 3.81 (L) 3.87 - 5.11 MIL/uL   Hemoglobin 11.3 (L) 12.0 - 15.0 g/dL   HCT 83.6 (L) 62.9 - 47.6 %   MCV 90.8 80.0 - 100.0 fL   MCH 29.7 26.0 - 34.0 pg   MCHC 32.7 30.0 - 36.0 g/dL   RDW 54.6 50.3 - 54.6 %   Platelets 243 150 - 400 K/uL   nRBC 0.0 0.0 - 0.2 %  Wet prep, genital   Collection Time: 02/13/21  1:37 AM  Result Value Ref Range   Yeast Wet Prep HPF POC NONE SEEN NONE SEEN   Trich, Wet Prep NONE SEEN NONE SEEN   Clue Cells Wet Prep HPF POC PRESENT (A) NONE SEEN   WBC, Wet Prep HPF POC MODERATE (A) NONE SEEN   Sperm NONE SEEN   Urinalysis, Routine w reflex microscopic Urine, Catheterized   Collection Time: 02/13/21  1:49 AM  Result Value Ref Range   Color, Urine AMBER (A) YELLOW   APPearance HAZY (A) CLEAR   Specific Gravity, Urine 1.012 1.005 - 1.030   pH 6.0 5.0 - 8.0   Glucose, UA NEGATIVE NEGATIVE mg/dL   Hgb urine dipstick LARGE (A) NEGATIVE   Bilirubin Urine NEGATIVE NEGATIVE   Ketones, ur NEGATIVE NEGATIVE mg/dL   Protein, ur 568 (A) NEGATIVE mg/dL   Nitrite NEGATIVE NEGATIVE   Leukocytes,Ua TRACE (A) NEGATIVE   RBC / HPF >50 (H) 0 - 5 RBC/hpf   WBC, UA >50 (H) 0 - 5 WBC/hpf   Bacteria, UA NONE SEEN NONE SEEN  Resp Panel by RT-PCR (Flu A&B, Covid) Urine, Catheterized   Collection Time: 02/13/21  1:51 AM   Specimen: Urine, Catheterized; Nasopharyngeal(NP) swabs in vial transport medium  Result Value Ref Range   SARS Coronavirus 2 by RT PCR NEGATIVE NEGATIVE   Influenza A by PCR NEGATIVE NEGATIVE   Influenza B by PCR NEGATIVE NEGATIVE    Discharge Condition: Stable  Disposition: Discharge disposition: 01-Home or Self  Care        Discharge Instructions    Discharge patient   Complete by: As directed    Discharge disposition: 01-Home or Self Care   Discharge patient date: 02/14/2021     Allergies as of 02/14/2021      Reactions   Sulfa Antibiotics Swelling   Mouth/facial swelling   Codeine Swelling   "not actually allergic, just doesn't want to take it   Nsaids Other (See Comments)   Interacts with GERD      Medication List    TAKE these medications   Buprenorphine HCl-Naloxone HCl  2-0.5 MG Film Commonly known as: Suboxone Place 3 Film under the tongue daily for 14 days. In divided doses: 2 in AM and 1 in pm   CALCIUM 600 + D PO Take 1 tablet by mouth daily.   clobetasol cream 0.05 % Commonly known as: Temovate Apply 1 application topically 2 (two) times daily.   cloNIDine 0.2 MG tablet Commonly known as: CATAPRES Take 1 tablet (0.2 mg total) by mouth at bedtime.   hydroxychloroquine 200 MG tablet Commonly known as: PLAQUENIL Take 2 tablets (400 mg total) by mouth daily.   labetalol 200 MG tablet Commonly known as: NORMODYNE Take 1 tablet (200 mg total) by mouth 2 (two) times daily.   lamoTRIgine 200 MG tablet Commonly known as: LAMICTAL Take 1 tablet (200 mg total) by mouth daily.   metoCLOPramide 10 MG tablet Commonly known as: REGLAN Take 1 tablet (10 mg total) by mouth 4 (four) times daily as needed for nausea or vomiting.   metroNIDAZOLE 500 MG tablet Commonly known as: FLAGYL Take 1 tablet (500 mg total) by mouth 2 (two) times daily for 7 days.   multivitamin-prenatal 27-0.8 MG Tabs tablet Take 1 tablet by mouth daily at 12 noon.   PrePLUS 27-1 MG Tabs Take 1 tablet by mouth daily.   OLANZapine 20 MG tablet Commonly known as: ZYPREXA Take 20 mg by mouth at bedtime.   omeprazole 20 MG capsule Commonly known as: PRILOSEC TAKE 2 CAPSULES BY MOUTH TWICE DAILY AS NEEDED FOR ACID REFLUX.   ondansetron 4 MG disintegrating tablet Commonly known as: Zofran  ODT Take 1 tablet (4 mg total) by mouth every 6 (six) hours.   ondansetron 4 MG disintegrating tablet Commonly known as: ZOFRAN-ODT DISSOLVE 1 TABLET IN MOUTH EVERY 6 HOURS AS NEEDED FOR NAUSEA   polyethylene glycol powder 17 GM/SCOOP powder Commonly known as: GLYCOLAX/MIRALAX Take 17 g by mouth 2 (two) times daily as needed.   predniSONE 20 MG tablet Commonly known as: DELTASONE Take 1 tablet (20 mg total) by mouth daily.   promethazine 25 MG tablet Commonly known as: PHENERGAN Take 1 tablet (25 mg total) by mouth every 6 (six) hours as needed for nausea or vomiting.       Follow-up Information    [facility] Follow up in 3 day(s).   Specialty: Obstetrics and Gynecology Why: 2 hr GTT Contact information: [address] [place] [address] [phone]              Signed: Scheryl Darter M.D. 02/14/2021, 8:01 AM

## 2021-02-14 NOTE — Discharge Instructions (Signed)

## 2021-02-15 ENCOUNTER — Telehealth: Payer: Self-pay

## 2021-02-15 ENCOUNTER — Other Ambulatory Visit: Payer: Self-pay | Admitting: Family Medicine

## 2021-02-15 DIAGNOSIS — B379 Candidiasis, unspecified: Secondary | ICD-10-CM

## 2021-02-15 LAB — URINE CULTURE: Culture: >=100000 — AB

## 2021-02-15 LAB — GC/CHLAMYDIA PROBE AMP (~~LOC~~) NOT AT ARMC
Chlamydia: NEGATIVE
Comment: NEGATIVE
Comment: NORMAL
Neisseria Gonorrhea: NEGATIVE

## 2021-02-15 MED ORDER — TERCONAZOLE 0.4 % VA CREA
TOPICAL_CREAM | VAGINAL | 0 refills | Status: DC
Start: 2021-02-15 — End: 2021-02-16

## 2021-02-15 NOTE — Telephone Encounter (Signed)
Transition Care Management Unsuccessful Follow-up Telephone Call  Date of discharge and from where:  02/14/2021 from [facility]  Attempts:  1st Attempt  Reason for unsuccessful TCM follow-up call:  Left voice message

## 2021-02-15 NOTE — Telephone Encounter (Signed)
Pt called requesting a Rx for Diflucan. Per OB protocol,  Terconazole 1 applicator intravaginally QHS x 3 days was sent to her pharmacy.  Yanely Mast l Savita Runner, CMA

## 2021-02-16 ENCOUNTER — Other Ambulatory Visit: Payer: Self-pay | Admitting: Family Medicine

## 2021-02-16 DIAGNOSIS — B379 Candidiasis, unspecified: Secondary | ICD-10-CM

## 2021-02-16 NOTE — Telephone Encounter (Signed)
Transition Care Management Unsuccessful Follow-up Telephone Call  Date of discharge and from where:  02/14/2021 from [facility]  Attempts:  2nd Attempt  Reason for unsuccessful TCM follow-up call:  Left voice message

## 2021-02-17 ENCOUNTER — Ambulatory Visit (INDEPENDENT_AMBULATORY_CARE_PROVIDER_SITE_OTHER): Payer: Medicaid Other | Admitting: Family Medicine

## 2021-02-17 ENCOUNTER — Other Ambulatory Visit: Payer: Self-pay | Admitting: Family Medicine

## 2021-02-17 ENCOUNTER — Other Ambulatory Visit: Payer: Self-pay

## 2021-02-17 ENCOUNTER — Telehealth: Payer: Self-pay | Admitting: Pediatrics

## 2021-02-17 VITALS — BP 150/93 | HR 87 | Wt 161.0 lb

## 2021-02-17 DIAGNOSIS — O169 Unspecified maternal hypertension, unspecified trimester: Secondary | ICD-10-CM

## 2021-02-17 DIAGNOSIS — B379 Candidiasis, unspecified: Secondary | ICD-10-CM

## 2021-02-17 DIAGNOSIS — O0993 Supervision of high risk pregnancy, unspecified, third trimester: Secondary | ICD-10-CM

## 2021-02-17 DIAGNOSIS — M329 Systemic lupus erythematosus, unspecified: Secondary | ICD-10-CM

## 2021-02-17 DIAGNOSIS — O99320 Drug use complicating pregnancy, unspecified trimester: Secondary | ICD-10-CM

## 2021-02-17 DIAGNOSIS — F1120 Opioid dependence, uncomplicated: Secondary | ICD-10-CM

## 2021-02-17 DIAGNOSIS — O365920 Maternal care for other known or suspected poor fetal growth, second trimester, not applicable or unspecified: Secondary | ICD-10-CM

## 2021-02-17 DIAGNOSIS — Z3A29 29 weeks gestation of pregnancy: Secondary | ICD-10-CM

## 2021-02-17 DIAGNOSIS — F3112 Bipolar disorder, current episode manic without psychotic features, moderate: Secondary | ICD-10-CM

## 2021-02-17 DIAGNOSIS — O99891 Other specified diseases and conditions complicating pregnancy: Secondary | ICD-10-CM

## 2021-02-17 MED ORDER — BUPRENORPHINE HCL-NALOXONE HCL 2-0.5 MG SL FILM: 3.0000 | ORAL_FILM | Freq: Every day | SUBLINGUAL | 0 refills | Status: DC

## 2021-02-17 MED ORDER — LABETALOL HCL 200 MG PO TABS: 400.0000 mg | ORAL_TABLET | Freq: Two times a day (BID) | ORAL | 3 refills | Status: DC

## 2021-02-17 NOTE — Progress Notes (Signed)
First BP 154/86. Repeat BP 150/93. Pt denies headache or visual changes. Pt states BP is high due to stress with being late to appt. Pt declines 28 wk labs today PHQ- 15 GAD 21

## 2021-02-17 NOTE — Progress Notes (Signed)
PRENATAL VISIT NOTE  Subjective:  Wendy Herman is a 35 y.o. G3P0201 at [redacted]w[redacted]d being seen today for ongoing prenatal care.  She is currently monitored for the following issues for this high-risk pregnancy and has Chest pain; Neutropenia (HCC); Anemia; SLE (systemic lupus erythematosus) (HCC); Discoid lupus erythematosus; Bipolar affective disorder, current episode hypomanic (HCC); Vitamin D insufficiency; Bipolar 1 disorder with moderate mania (HCC); Supervision of high-risk pregnancy; History of preterm delivery, currently pregnant; Tobacco smoking affecting pregnancy, antepartum; Hypertension affecting pregnancy, antepartum; Lupus anticoagulant affecting pregnancy, antepartum (HCC); Suboxone maintenance treatment complicating pregnancy, antepartum, unspecified trimester (HCC); ASCUS with positive high risk HPV cervical; Systemic lupus erythematosus affecting pregnancy (HCC); IUGR (intrauterine growth restriction) affecting care of mother, second trimester; and Vaginal bleeding in pregnancy on their problem list.  Patient reports a lupus flare with ulcerations on the scalp and in the mouth.  Contractions: Not present. Vag. Bleeding: None.  Movement: Present. Denies leaking of fluid.   The following portions of the patient's history were reviewed and updated as appropriate: allergies, current medications, past family history, past medical history, past social history, past surgical history and problem list.   Objective:   Vitals:   02/17/21 1050 02/17/21 1110  BP: (!) 154/86 (!) 150/93  Pulse: 87   Weight: 161 lb (73 kg)     Fetal Status: Fetal Heart Rate (bpm): 136   Movement: Present     General:  Alert, oriented and cooperative. Patient is in no acute distress.  Skin: Skin is warm and dry. No rash noted.   Cardiovascular: Normal heart rate noted  Respiratory: Normal respiratory effort, no problems with respiration noted  Abdomen: Soft, gravid, appropriate for gestational age.   Pain/Pressure: Present     Pelvic: Cervical exam deferred        Extremities: Normal range of motion.  Edema: Trace  Mental Status: Normal mood and affect. Normal behavior. Normal judgment and thought content.   Assessment and Plan:  Pregnancy: G3P0201 at [redacted]w[redacted]d 1. [redacted] weeks gestation of pregnancy  2. Supervision of high risk pregnancy in third trimester FHT and FH normal. Unable to do 2hr GTT today. Will try to do 1hr GTT next appt.  3. IUGR (intrauterine growth restriction) affecting care of mother, second trimester EFW 10% on 4/29 Increased dopplers Continue weekly dopplers  4. Systemic lupus erythematosus affecting pregnancy (HCC) Flare - increase prednisone to 50mg  daily x3 days, then decrease by 10mg  every 3 days back to 20mg  daily.  5. Hypertension affecting pregnancy, antepartum On labetalol - increase to 400mg  BID  6. Suboxone maintenance treatment complicating pregnancy, antepartum, unspecified trimester (HCC) Stable. UDS today. PMP reviewed. NICU referral done - BUPRENORPHINE + Drug Screen, Ur  7. Bipolar 1 disorder with moderate mania (HCC)   Preterm labor symptoms and general obstetric precautions including but not limited to vaginal bleeding, contractions, leaking of fluid and fetal movement were reviewed in detail with the patient. Please refer to After Visit Summary for other counseling recommendations.   No follow-ups on file.  Future Appointments  Date Time Provider Department Center  02/19/2021  3:00 PM [facility] NURSE [facility]  02/19/2021  3:15 PM [facility] NST [facility]  02/19/2021  3:45 PM [facility] US1 [facility]  02/24/2021  3:45 PM [facility] MD [facility] None  03/04/2021 10:40 AM [facility] NP [facility] None  03/10/2021  3:45 PM Venora Maples, DO [facility] None  03/26/2021 10:45 AM Barbette Merino 05/10/2021, DO [facility] None    Levie Heritage  Manus Rudd, DO

## 2021-02-17 NOTE — Telephone Encounter (Signed)
Transition Care Management Follow-up Telephone Call  Date of discharge and from where: 02/14/2021 from [facility]  How have you been since you were released from the hospital? Pt states that she is doing well since she has been home and has no questions or concerns at this time.   Any questions or concerns? No  Items Reviewed:  Did the pt receive and understand the discharge instructions provided? Yes   Medications obtained and verified? Yes   Other? No   Any new allergies since your discharge? No   Dietary orders reviewed? n/a  Do you have support at home? Yes   Functional Questionnaire: (I = Independent and D = Dependent) ADLs: I  Bathing/Dressing- I  Meal Prep- I  Eating- I  Maintaining continence- I  Transferring/Ambulation- I  Managing Meds- I   Follow up appointments reviewed:   PCP Hospital f/u appt confirmed? No    Specialist Hospital f/u appt confirmed? Yes  Scheduled to see Candelaria Celeste, DO on 02/17/2021 @ 10:30am.  Are transportation arrangements needed? No   If their condition worsens, is the pt aware to call PCP or go to the Emergency Dept.? Yes  Was the patient provided with contact information for the PCP's office or ED? Yes  Was to pt encouraged to call back with questions or concerns? Yes

## 2021-02-18 ENCOUNTER — Encounter: Payer: Self-pay | Admitting: *Deleted

## 2021-02-18 ENCOUNTER — Telehealth: Payer: Self-pay | Admitting: *Deleted

## 2021-02-18 NOTE — Telephone Encounter (Signed)
Call to patient. Notified c-section is scheduled for 04-28-21 at 1230, arrive at 1030. Instructed will receive call from pre-op nurse with additional instructions. Letter confirmation and hospital brochure mailed.   Encounter closed.

## 2021-02-19 ENCOUNTER — Ambulatory Visit: Payer: Medicaid Other | Admitting: *Deleted

## 2021-02-19 ENCOUNTER — Other Ambulatory Visit: Payer: Self-pay | Admitting: Obstetrics

## 2021-02-19 ENCOUNTER — Other Ambulatory Visit: Payer: Self-pay

## 2021-02-19 ENCOUNTER — Other Ambulatory Visit: Payer: Self-pay | Admitting: Obstetrics and Gynecology

## 2021-02-19 ENCOUNTER — Ambulatory Visit: Payer: Medicaid Other

## 2021-02-19 ENCOUNTER — Ambulatory Visit: Payer: Medicaid Other | Attending: Obstetrics

## 2021-02-19 VITALS — BP 131/69 | HR 88

## 2021-02-19 DIAGNOSIS — O99323 Drug use complicating pregnancy, third trimester: Secondary | ICD-10-CM

## 2021-02-19 DIAGNOSIS — O365930 Maternal care for other known or suspected poor fetal growth, third trimester, not applicable or unspecified: Secondary | ICD-10-CM

## 2021-02-19 DIAGNOSIS — F319 Bipolar disorder, unspecified: Secondary | ICD-10-CM | POA: Diagnosis not present

## 2021-02-19 DIAGNOSIS — Z5181 Encounter for therapeutic drug level monitoring: Secondary | ICD-10-CM

## 2021-02-19 DIAGNOSIS — O10912 Unspecified pre-existing hypertension complicating pregnancy, second trimester: Secondary | ICD-10-CM | POA: Diagnosis not present

## 2021-02-19 DIAGNOSIS — O99891 Other specified diseases and conditions complicating pregnancy: Secondary | ICD-10-CM | POA: Diagnosis not present

## 2021-02-19 DIAGNOSIS — O10013 Pre-existing essential hypertension complicating pregnancy, third trimester: Secondary | ICD-10-CM

## 2021-02-19 DIAGNOSIS — O99343 Other mental disorders complicating pregnancy, third trimester: Secondary | ICD-10-CM

## 2021-02-19 DIAGNOSIS — Z79899 Other long term (current) drug therapy: Secondary | ICD-10-CM | POA: Insufficient documentation

## 2021-02-19 DIAGNOSIS — O34219 Maternal care for unspecified type scar from previous cesarean delivery: Secondary | ICD-10-CM | POA: Diagnosis not present

## 2021-02-19 DIAGNOSIS — O99333 Smoking (tobacco) complicating pregnancy, third trimester: Secondary | ICD-10-CM

## 2021-02-19 DIAGNOSIS — M329 Systemic lupus erythematosus, unspecified: Secondary | ICD-10-CM

## 2021-02-19 DIAGNOSIS — Z3A29 29 weeks gestation of pregnancy: Secondary | ICD-10-CM

## 2021-02-19 DIAGNOSIS — O09213 Supervision of pregnancy with history of pre-term labor, third trimester: Secondary | ICD-10-CM

## 2021-02-19 DIAGNOSIS — O09293 Supervision of pregnancy with other poor reproductive or obstetric history, third trimester: Secondary | ICD-10-CM | POA: Diagnosis not present

## 2021-02-19 DIAGNOSIS — F1920 Other psychoactive substance dependence, uncomplicated: Secondary | ICD-10-CM | POA: Diagnosis not present

## 2021-02-19 DIAGNOSIS — D649 Anemia, unspecified: Secondary | ICD-10-CM

## 2021-02-19 DIAGNOSIS — F17210 Nicotine dependence, cigarettes, uncomplicated: Secondary | ICD-10-CM | POA: Diagnosis not present

## 2021-02-19 DIAGNOSIS — O99013 Anemia complicating pregnancy, third trimester: Secondary | ICD-10-CM

## 2021-02-22 ENCOUNTER — Other Ambulatory Visit: Payer: Self-pay | Admitting: *Deleted

## 2021-02-22 DIAGNOSIS — O365990 Maternal care for other known or suspected poor fetal growth, unspecified trimester, not applicable or unspecified: Secondary | ICD-10-CM

## 2021-02-23 ENCOUNTER — Other Ambulatory Visit: Payer: Self-pay

## 2021-02-23 ENCOUNTER — Encounter (HOSPITAL_COMMUNITY): Payer: Self-pay | Admitting: Obstetrics and Gynecology

## 2021-02-23 ENCOUNTER — Inpatient Hospital Stay (HOSPITAL_COMMUNITY)
Admission: AD | Admit: 2021-02-23 | Discharge: 2021-02-24 | Disposition: A | Discharge status: Home / Self Care | Payer: Medicaid Other | Attending: Obstetrics and Gynecology | Admitting: Obstetrics and Gynecology

## 2021-02-23 DIAGNOSIS — O4703 False labor before 37 completed weeks of gestation, third trimester: Secondary | ICD-10-CM | POA: Diagnosis not present

## 2021-02-23 DIAGNOSIS — O4700 False labor before 37 completed weeks of gestation, unspecified trimester: Secondary | ICD-10-CM

## 2021-02-23 DIAGNOSIS — Z3A29 29 weeks gestation of pregnancy: Secondary | ICD-10-CM | POA: Insufficient documentation

## 2021-02-23 DIAGNOSIS — R519 Headache, unspecified: Secondary | ICD-10-CM

## 2021-02-23 DIAGNOSIS — Z8249 Family history of ischemic heart disease and other diseases of the circulatory system: Secondary | ICD-10-CM | POA: Insufficient documentation

## 2021-02-23 DIAGNOSIS — O10913 Unspecified pre-existing hypertension complicating pregnancy, third trimester: Secondary | ICD-10-CM | POA: Diagnosis not present

## 2021-02-23 DIAGNOSIS — O10919 Unspecified pre-existing hypertension complicating pregnancy, unspecified trimester: Secondary | ICD-10-CM

## 2021-02-23 DIAGNOSIS — M7989 Other specified soft tissue disorders: Secondary | ICD-10-CM | POA: Diagnosis not present

## 2021-02-23 DIAGNOSIS — O26893 Other specified pregnancy related conditions, third trimester: Secondary | ICD-10-CM

## 2021-02-23 DIAGNOSIS — O10013 Pre-existing essential hypertension complicating pregnancy, third trimester: Secondary | ICD-10-CM | POA: Diagnosis not present

## 2021-02-23 LAB — URINALYSIS, ROUTINE W REFLEX MICROSCOPIC
Bilirubin Urine: NEGATIVE
Glucose, UA: NEGATIVE mg/dL
Hgb urine dipstick: NEGATIVE
Ketones, ur: 5 mg/dL — AB
Leukocytes,Ua: NEGATIVE
Nitrite: NEGATIVE
Protein, ur: NEGATIVE mg/dL
Specific Gravity, Urine: 1.024 (ref 1.005–1.030)
pH: 7 (ref 5.0–8.0)

## 2021-02-23 LAB — PROTEIN / CREATININE RATIO, URINE
Creatinine, Urine: 99.2 mg/dL
Protein Creatinine Ratio: 0.16 mg/mg{Cre} — ABNORMAL HIGH (ref 0.00–0.15)
Total Protein, Urine: 16 mg/dL

## 2021-02-23 LAB — COMPREHENSIVE METABOLIC PANEL
ALT: 28 U/L (ref 0–44)
AST: 25 U/L (ref 15–41)
Albumin: 3 g/dL — ABNORMAL LOW (ref 3.5–5.0)
Alkaline Phosphatase: 102 U/L (ref 38–126)
Anion gap: 6 (ref 5–15)
BUN: 11 mg/dL (ref 6–20)
CO2: 23 mmol/L (ref 22–32)
Calcium: 8.8 mg/dL — ABNORMAL LOW (ref 8.9–10.3)
Chloride: 105 mmol/L (ref 98–111)
Creatinine, Ser: 0.57 mg/dL (ref 0.44–1.00)
GFR, Estimated: >60 mL/min (ref >60)
Glucose, Bld: 141 mg/dL — ABNORMAL HIGH (ref 70–99)
Potassium: 3.9 mmol/L (ref 3.5–5.1)
Sodium: 134 mmol/L — ABNORMAL LOW (ref 135–145)
Total Bilirubin: 0.3 mg/dL (ref 0.3–1.2)
Total Protein: 6 g/dL — ABNORMAL LOW (ref 6.5–8.1)

## 2021-02-23 LAB — CBC
HCT: 33.7 % — ABNORMAL LOW (ref 36.0–46.0)
Hemoglobin: 11.2 g/dL — ABNORMAL LOW (ref 12.0–15.0)
MCH: 29.4 pg (ref 26.0–34.0)
MCHC: 33.2 g/dL (ref 30.0–36.0)
MCV: 88.5 fL (ref 80.0–100.0)
Platelets: 239 10*3/uL (ref 150–400)
RBC: 3.81 MIL/uL — ABNORMAL LOW (ref 3.87–5.11)
RDW: 13.3 % (ref 11.5–15.5)
WBC: 8.2 10*3/uL (ref 4.0–10.5)
nRBC: 0 % (ref 0.0–0.2)

## 2021-02-23 LAB — FETAL FIBRONECTIN: Fetal Fibronectin: NEGATIVE

## 2021-02-23 MED ORDER — TERBUTALINE SULFATE 1 MG/ML IJ SOLN: 0.2500 mg | Freq: Once | INTRAMUSCULAR | Status: DC

## 2021-02-23 MED ORDER — TERBUTALINE SULFATE 1 MG/ML IJ SOLN
0.2500 mg | Freq: Once | INTRAMUSCULAR | Status: AC
  Administered 2021-02-23: 0.25 mg via SUBCUTANEOUS
  Filled 2021-02-23: qty 1

## 2021-02-23 MED ORDER — ACETAMINOPHEN 500 MG PO TABS
1000.0000 mg | ORAL_TABLET | Freq: Four times a day (QID) | ORAL | Status: DC | PRN
  Administered 2021-02-23: 1000 mg via ORAL
  Filled 2021-02-23: qty 2

## 2021-02-23 NOTE — MAU Note (Signed)
Pt has HTN and is on medication which she is taking. States her b/p has been up the last couple of days. Has had h/a and some dizziness. Feet swollen. Has not taken anything for h/a. Pt has 2 cuffs from home which she took her b/p in Triage. One cuff read 207 116 in Triage and the other read 141 77.

## 2021-02-23 NOTE — Discharge Instructions (Signed)
Hypertension During Pregnancy High blood pressure (hypertension) is when the force of blood pumping through the arteries is high enough to cause problems with your health. Arteries are blood vessels that carry blood from the heart throughout the body. Hypertension during pregnancy can cause problems for you and your baby. It can be mild or severe. There are different types of hypertension that can happen during pregnancy. These include:  Chronic hypertension. This happens when you had high blood pressure before you became pregnant, and it continues during the pregnancy. Hypertension that develops before you are [redacted] weeks pregnant and continues during the pregnancy is also called chronic hypertension. If you have chronic hypertension, it will not go away after you have your baby. You will need follow-up visits with your health care provider after you have your baby. Your health care provider may want you to keep taking medicine for your blood pressure.  Gestational hypertension. This is hypertension that develops after the 20th week of pregnancy. Gestational hypertension usually goes away after you have your baby, but your health care provider will need to monitor your blood pressure to make sure that it is getting better.  Postpartum hypertension. This is high blood pressure that was present before delivery and continues after delivery or that starts after delivery. This usually occurs within 48 hours after childbirth but may occur up to 6 weeks after giving birth. When hypertension during pregnancy is severe, it is a medical emergency that requires treatment right away. How does this affect me? Women who have hypertension during pregnancy have a greater chance of developing hypertension later in life or during future pregnancies. In some cases, hypertension during pregnancy can cause serious complications, such as:  Stroke.  Heart attack.  Injury to other organs, such as kidneys, lungs, or  liver.  Preeclampsia.  A condition called hemolysis, elevated liver enzymes, and low platelet count (HELLP) syndrome.  Convulsions or seizures.  Placental abruption. How does this affect my baby? Hypertension during pregnancy can affect your baby. Your baby may:  Be born early (prematurely).  Not weigh as much as he or she should at birth (low birth weight).  Not tolerate labor well, leading to an unplanned cesarean delivery. This condition may also result in a baby's death before birth (stillbirth). What are the risks? There are certain factors that make it more likely for you to develop hypertension during pregnancy. These include:  Having hypertension during a previous pregnancy or a family history of hypertension.  Being overweight.  Being age 35 or older.  Being pregnant for the first time.  Being pregnant with more than one baby.  Becoming pregnant using fertilization methods, such as IVF (in vitro fertilization).  Having other medical problems, such as diabetes, kidney disease, or lupus. What can I do to lower my risk? The exact cause of hypertension during pregnancy is not known. You may be able to lower your risk by:  Maintaining a healthy weight.  Eating a healthy and balanced diet.  Following your health care provider's instructions about treating any long-term conditions that you had before becoming pregnant. It is very important to keep all of your prenatal care appointments. Your health care provider will check your blood pressure and make sure that your pregnancy is progressing as expected. If a problem is found, early treatment can prevent complications.   How is this treated? Treatment for hypertension during pregnancy varies depending on the type of hypertension you have and how serious it is.  If you were   taking medicine for high blood pressure before you became pregnant, talk with your health care provider. You may need to change medicine during  pregnancy because some medicines, like ACE inhibitors, may not be considered safe for your baby.  If you have gestational hypertension, your health care provider may order medicine to treat this during pregnancy.  If you are at risk for preeclampsia, your health care provider may recommend that you take a low-dose aspirin during your pregnancy.  If you have severe hypertension, you may need to be hospitalized so you and your baby can be monitored closely. You may also need to be given medicine to lower your blood pressure.  In some cases, if your condition gets worse, you may need to deliver your baby early. Follow these instructions at home: Eating and drinking  Drink enough fluid to keep your urine pale yellow.  Avoid caffeine.   Lifestyle  Do not use any products that contain nicotine or tobacco. These products include cigarettes, chewing tobacco, and vaping devices, such as e-cigarettes. If you need help quitting, ask your health care provider.  Do not use alcohol or drugs.  Avoid stress as much as possible.  Rest and get plenty of sleep.  Regular exercise can help to reduce your blood pressure. Ask your health care provider what kinds of exercise are best for you. General instructions  Take over-the-counter and prescription medicines only as told by your health care provider.  Keep all prenatal and follow-up visits. This is important. Contact a health care provider if:  You have symptoms that your health care provider told you may require more treatment or monitoring, such as: ? Headaches. ? Nausea or vomiting. ? Abdominal pain. ? Dizziness. ? Light-headedness. Get help right away if:  You have symptoms of serious complications, such as: ? Severe abdominal pain that does not get better with treatment. ? A severe headache that does not get better, blurred vision, or double vision. ? Vomiting that does not get better. ? Sudden, rapid weight gain or swelling in your  hands, ankles, or face. ? Vaginal bleeding. ? Blood in your urine. ? Shortness of breath or chest pain. ? Weakness on one side of your body or difficulty speaking.  Your baby is not moving as much as usual. These symptoms may represent a serious problem that is an emergency. Do not wait to see if the symptoms will go away. Get medical help right away. Call your local emergency services (911 in the [place] Do not drive yourself to the hospital. Summary  Hypertension during pregnancy can cause problems for you and your baby.  Treatment for hypertension during pregnancy varies depending on the type of hypertension you have and how serious it is.  Keep all prenatal and follow-up visits. This is important.  Get help right away if you have symptoms of serious complications related to high blood pressure. This information is not intended to replace advice given to you by your health care provider. Make sure you discuss any questions you have with your health care provider. Document Revised: 06/11/2020 Document Reviewed: 06/11/2020 Elsevier Patient Education  2021 [employer] Preterm Labor The normal length of a pregnancy is 39-41 weeks. Preterm labor is when labor starts before 37 completed weeks of pregnancy. Babies who are born prematurely and survive may not be fully developed and may be at an increased risk for long-term problems such as cerebral palsy, developmental delays, and vision and hearing problems. Babies who are born too early may  have problems soon after birth. Problems may include regulating blood sugar, body temperature, heart rate, and breathing rate. These babies often have trouble with feeding. The risk of having problems is highest for babies who are born before 34 weeks of pregnancy. What are the causes? The exact cause of this condition is not known. What increases the risk? You are more likely to have preterm labor if you have certain risk factors that relate to your  medical history, problems with present and past pregnancies, and lifestyle factors. Medical history  You have abnormalities of the uterus, including a short cervix.  You have STIs (sexually transmitted infections), or other infections of the urinary tract and the vagina.  You have chronic illnesses, such as blood clotting problems, diabetes, or high blood pressure.  You are overweight or underweight. Present and past pregnancies  You have had preterm labor before.  You are pregnant with twins or other multiples.  You have been diagnosed with a condition in which the placenta covers your cervix (placenta previa).  You waited less than 6 months between giving birth and becoming pregnant again.  Your unborn baby has some abnormalities.  You have vaginal bleeding during pregnancy.  You became pregnant through in vitro fertilization (IVF). Lifestyle and environmental factors  You use tobacco products.  You drink alcohol.  You use street drugs.  You have stress and no social support.  You experience domestic violence.  You are exposed to certain chemicals or environmental pollutants. Other factors  You are younger than age 52 or older than age 65. What are the signs or symptoms? Symptoms of this condition include:  Cramps similar to those that can happen during a menstrual period. The cramps may happen with diarrhea.  Pain in the abdomen or lower back.  Regular contractions that may feel like tightening of the abdomen.  A feeling of increased pressure in the pelvis.  Increased watery or bloody mucus discharge from the vagina.  Water breaking (ruptured amniotic sac). How is this diagnosed? This condition is diagnosed based on:  Your medical history and a physical exam.  A pelvic exam.  An ultrasound.  Monitoring your uterus for contractions.  Other tests, including: ? A swab of the cervix to check for a chemical called fetal fibronectin. ? Urine  tests. How is this treated? Treatment for this condition depends on the length of your pregnancy, your condition, and the health of your baby. Treatment may include:  Taking medicines, such as: ? Hormone medicines. These may be given early in pregnancy to help support the pregnancy. ? Medicines to stop contractions. ? Medicines to help mature the baby's lungs. These may be prescribed if the risk of delivery is high. ? Medicines to prevent your baby from developing cerebral palsy.  Bed rest. If the labor happens before 34 weeks of pregnancy, you may need to stay in the hospital.  Delivery of the baby. Follow these instructions at home:  Do not use any products that contain nicotine or tobacco, such as cigarettes, e-cigarettes, and chewing tobacco. If you need help quitting, ask your health care provider.  Do not drink alcohol.  Take over-the-counter and prescription medicines only as told by your health care provider.  Rest as told by your health care provider.  Return to your normal activities as told by your health care provider. Ask your health care provider what activities are safe for you.  Keep all follow-up visits as told by your health care provider. This is important.  How is this prevented? To increase your chance of having a full-term pregnancy:  Do not use street drugs or medicines that have not been prescribed to you during your pregnancy.  Talk with your health care provider before taking any herbal supplements, even if you have been taking them regularly.  Make sure you gain a healthy amount of weight during your pregnancy.  Watch for infection. If you think that you might have an infection, get it checked right away. Symptoms of infection may include: ? Fever. ? Abnormal vaginal discharge or discharge that smells bad. ? Pain or burning with urination. ? Needing to urinate urgently. ? Frequently urinating or passing small amounts of urine frequently. ? Blood in  your urine. ? Urine that smells bad or unusual.  Tell your health care provider if you have had preterm labor before. Contact a health care provider if:  You think you are going into preterm labor.  You have signs or symptoms of preterm labor.  You have symptoms of infection. Get help right away if:  You are having regular, painful contractions every 5 minutes or less.  Your water breaks. Summary  Preterm labor is labor that starts before you reach 37 weeks of pregnancy.  Delivering your baby early increases your baby's risk of developing lifelong problems.  The exact cause of preterm labor is unknown. However, having an abnormal uterus, an STI (sexually transmitted infection), or vaginal bleeding during pregnancy increases your risk for preterm labor.  Keep all follow-up visits as told by your health care provider. This is important.  Contact a health care provider if you have signs or symptoms of preterm labor. This information is not intended to replace advice given to you by your health care provider. Make sure you discuss any questions you have with your health care provider. Document Revised: 10/22/2019 Document Reviewed: 10/22/2019 Elsevier Patient Education  2021 [employer].

## 2021-02-23 NOTE — MAU Provider Note (Addendum)
History     CSN: [id number]  Arrival date and time: 02/23/21 1929   Event Date/Time   First Provider Initiated Contact with Patient 02/23/21 2058      Chief Complaint  Patient presents with  . Hypertension   35 y.o. [id number] @29 .6 wks with SLE and CHTN presenting with HA and elevated BP. Reports getting high readings with her BP cuff but she doesn't think it's working. HA is frontal and rates pain 6/10. Has not taken anything for it. States her eyes feel heavy, no blurry vision or spots. Denies CP. Reports SOB with exertion. Reports good FM. No pregnancy complaints. She is currently taking Labetalol 400 bid.  RN Note: Pt has HTN and is on medication which she is taking. States her b/p has been up the last couple of days. Has had h/a and some dizziness. Feet swollen. Has not taken anything for h/a. Pt has 2 cuffs from home which she took her b/p in Triage. One cuff read 207 116 in Triage and the other read 141 77.   OB History    Gravida  3   Para  2   Term      Preterm  2   AB  0   Living  1     SAB      IAB      Ectopic      Multiple      Live Births  2        Obstetric Comments  #2:C/s "her lupus was attacking the baby"  (CA) #1 8/10        Past Medical History:  Diagnosis Date  . Anxiety   . Arthritis   . Bipolar 1 disorder (HCC)   . Bipolar disorder (HCC)   . Chronic pain   . Depression   . Fibromyalgia   . Hypertension    'clonidine" for that  . Infection    UTI  . Lupus (HCC)   . Neutropenia (HCC)   . Suboxone maintenance treatment complicating pregnancy, antepartum [facility])     Past Surgical History:  Procedure Laterality Date  . CESAREAN SECTION    . WISDOM TOOTH EXTRACTION      Family History  Problem Relation Age of Onset  . Hypertension Mother   . Heart attack Father        In his 82s. Died from it.  . Depression Father   . Rheum arthritis Maternal Grandmother   . Lupus Maternal Grandmother   . Diabetes Maternal  Grandmother   . Heart disease Maternal Grandfather   . Diabetes Maternal Grandfather   . Breast cancer Maternal Aunt   . Pancreatic cancer Maternal Uncle   . Diabetes Maternal Aunt   . Diabetes Maternal Aunt     Social History   Tobacco Use  . Smoking status: Current Every Day Smoker    Packs/day: 0.25    Years: 15.00    Pack years: 3.75    Types: Cigarettes  . Smokeless tobacco: Never Used  Vaping Use  . Vaping Use: Never used  Substance Use Topics  . Alcohol use: No  . Drug use: Yes    Types: Marijuana    Comment: takes Suboxone- but not daily    Allergies:  Allergies  Allergen Reactions  . Sulfa Antibiotics Swelling    Mouth/facial swelling  . Codeine Swelling    "not actually allergic, just doesn't want to take it  . Nsaids Other (See Comments)    Interacts  with GERD    Medications Prior to Admission  Medication Sig Dispense Refill Last Dose  . Buprenorphine HCl-Naloxone HCl (SUBOXONE) 2-0.5 MG FILM Place 3 Film under the tongue daily for 14 days. In divided doses: 2 in AM and 1 in pm 42 each 0 02/23/2021 at Unknown time  . cephALEXin (KEFLEX) 500 MG capsule Take 1 capsule (500 mg total) by mouth 4 (four) times daily. 28 capsule 2 02/23/2021 at Unknown time  . cloNIDine (CATAPRES) 0.2 MG tablet Take 1 tablet (0.2 mg total) by mouth at bedtime. 30 tablet 5 02/23/2021 at Unknown time  . hydroxychloroquine (PLAQUENIL) 200 MG tablet Take 2 tablets (400 mg total) by mouth daily. 60 tablet 5 02/23/2021 at Unknown time  . labetalol (NORMODYNE) 200 MG tablet Take 2 tablets (400 mg total) by mouth 2 (two) times daily. 120 tablet 3 02/23/2021 at Unknown time  . lamoTRIgine (LAMICTAL) 200 MG tablet Take 1 tablet (200 mg total) by mouth daily. 30 tablet 5 02/23/2021 at Unknown time  . metoCLOPramide (REGLAN) 10 MG tablet Take 1 tablet (10 mg total) by mouth 4 (four) times daily as needed for nausea or vomiting. 120 tablet 2 02/23/2021 at Unknown time  . omeprazole (PRILOSEC) 20 MG  capsule TAKE 2 CAPSULES BY MOUTH TWICE DAILY AS NEEDED FOR ACID REFLUX. 120 capsule 3 02/23/2021 at Unknown time  . ondansetron (ZOFRAN ODT) 4 MG disintegrating tablet Take 1 tablet (4 mg total) by mouth every 6 (six) hours. 120 tablet 3 02/23/2021 at Unknown time  . predniSONE (DELTASONE) 20 MG tablet Take 1 tablet (20 mg total) by mouth daily. 90 tablet 3 02/23/2021 at Unknown time  . Prenatal Vit-Fe Fumarate-FA (PREPLUS) 27-1 MG TABS Take 1 tablet by mouth daily. 30 tablet 13 02/23/2021 at Unknown time  . promethazine (PHENERGAN) 25 MG tablet Take 1 tablet (25 mg total) by mouth every 6 (six) hours as needed for nausea or vomiting. 60 tablet 3 02/23/2021 at Unknown time  . terconazole (TERAZOL 7) 0.4 % vaginal cream INSERT 1 APPLICATORFUL VAGINALLY AT BEDTIME FOR 3 DAYS 45 g 0 02/22/2021 at Unknown time  . lidocaine (XYLOCAINE) 2 % solution Use as directed 15 mLs in the mouth or throat as needed for mouth pain. (Patient not taking: Reported on 02/17/2021) 15 mL 0   . OLANZapine (ZYPREXA) 20 MG tablet Take 20 mg by mouth at bedtime. (Patient not taking: Reported on 02/17/2021)       Review of Systems  Eyes: Positive for visual disturbance.  Respiratory: Negative for shortness of breath.   Cardiovascular: Negative for chest pain.  Gastrointestinal: Negative for abdominal pain.  Genitourinary: Negative for vaginal bleeding.  Neurological: Positive for dizziness and headaches.   Physical Exam   Blood pressure 131/68, pulse 93, temperature 99 F (37.2 C), resp. rate 18, height 5\' 4"  (1.626 m), weight 73.9 kg, last menstrual period 07/29/2020, SpO2 98 %. Patient Vitals for the past 24 hrs:  BP Temp Pulse Resp SpO2 Height Weight  02/23/21 2115 120/73 -- 98 -- 98 % -- --  02/23/21 2100 126/77 -- 93 -- 96 % -- --  02/23/21 2056 131/68 -- 93 -- -- -- --  02/23/21 2029 132/72 -- (!) 102 -- 98 % 5\' 4"  (1.626 m) 73.9 kg  02/23/21 2026 -- 99 F (37.2 C) -- 18 -- -- --    Physical Exam Vitals and  nursing note reviewed.  Constitutional:      General: She is not in acute distress.  Appearance: Normal appearance.  HENT:     Head: Normocephalic and atraumatic.  Cardiovascular:     Rate and Rhythm: Normal rate.  Pulmonary:     Effort: Pulmonary effort is normal. No respiratory distress.  Abdominal:     Palpations: Abdomen is soft.     Tenderness: There is no abdominal tenderness.     Comments: gravid  Musculoskeletal:        General: Normal range of motion.     Cervical back: Normal range of motion.  Skin:    General: Skin is warm and dry.  Neurological:     General: No focal deficit present.     Mental Status: She is alert and oriented to person, place, and time. Mental status is at baseline.     Cranial Nerves: No cranial nerve deficit.  Psychiatric:        Mood and Affect: Mood normal.        Behavior: Behavior normal.   EFM: 140 bpm, mod variability, no accels, rare variable decels Toco: UI  Results for orders placed or performed during the hospital encounter of 02/23/21 (from the past 24 hour(s))  Urinalysis, Routine w reflex microscopic Urine, Clean Catch     Status: Abnormal   Collection Time: 02/23/21  8:40 PM  Result Value Ref Range   Color, Urine YELLOW YELLOW   APPearance HAZY (A) CLEAR   Specific Gravity, Urine 1.024 1.005 - 1.030   pH 7.0 5.0 - 8.0   Glucose, UA NEGATIVE NEGATIVE mg/dL   Hgb urine dipstick NEGATIVE NEGATIVE   Bilirubin Urine NEGATIVE NEGATIVE   Ketones, ur 5 (A) NEGATIVE mg/dL   Protein, ur NEGATIVE NEGATIVE mg/dL   Nitrite NEGATIVE NEGATIVE   Leukocytes,Ua NEGATIVE NEGATIVE   MAU Course  Procedures Tylenol  MDM Labs ordered and pending. Transfer of care given to Lorenda Peck, CNM  02/23/2021 10:03 PM   Assumed care Results for orders placed or performed during the hospital encounter of 02/23/21 (from the past 24 hour(s))  Urinalysis, Routine w reflex microscopic     Status: Abnormal   Collection Time:  02/23/21  8:40 PM  Result Value Ref Range   Color, Urine YELLOW YELLOW   APPearance HAZY (A) CLEAR   Specific Gravity, Urine 1.024 1.005 - 1.030   pH 7.0 5.0 - 8.0   Glucose, UA NEGATIVE NEGATIVE mg/dL   Hgb urine dipstick NEGATIVE NEGATIVE   Bilirubin Urine NEGATIVE NEGATIVE   Ketones, ur 5 (A) NEGATIVE mg/dL   Protein, ur NEGATIVE NEGATIVE mg/dL   Nitrite NEGATIVE NEGATIVE   Leukocytes,Ua NEGATIVE NEGATIVE  Protein / creatinine ratio, urine     Status: Abnormal   Collection Time: 02/23/21  8:40 PM  Result Value Ref Range   Creatinine, Urine 99.20 mg/dL   Total Protein, Urine 16 mg/dL   Protein Creatinine Ratio 0.16 (H) 0.00 - 0.15 mg/mg[Cre]  Comprehensive metabolic panel     Status: Abnormal   Collection Time: 02/23/21  9:54 PM  Result Value Ref Range   Sodium 134 (L) 135 - 145 mmol/L   Potassium 3.9 3.5 - 5.1 mmol/L   Chloride 105 98 - 111 mmol/L   CO2 23 22 - 32 mmol/L   Glucose, Bld 141 (H) 70 - 99 mg/dL   BUN 11 6 - 20 mg/dL   Creatinine, Ser 3.50 0.44 - 1.00 mg/dL   Calcium 8.8 (L) 8.9 - 10.3 mg/dL   Total Protein 6.0 (L) 6.5 - 8.1 g/dL   Albumin 3.0 (  L) 3.5 - 5.0 g/dL   AST 25 15 - 41 U/L   ALT 28 0 - 44 U/L   Alkaline Phosphatase 102 38 - 126 U/L   Total Bilirubin 0.3 0.3 - 1.2 mg/dL   GFR, Estimated >56 >43 mL/min   Anion gap 6 5 - 15  CBC     Status: Abnormal   Collection Time: 02/23/21  9:54 PM  Result Value Ref Range   WBC 8.2 4.0 - 10.5 K/uL   RBC 3.81 (L) 3.87 - 5.11 MIL/uL   Hemoglobin 11.2 (L) 12.0 - 15.0 g/dL   HCT 32.9 (L) 51.8 - 84.1 %   MCV 88.5 80.0 - 100.0 fL   MCH 29.4 26.0 - 34.0 pg   MCHC 33.2 30.0 - 36.0 g/dL   RDW 66.0 63.0 - 16.0 %   Platelets 239 150 - 400 K/uL   nRBC 0.0 0.0 - 0.2 %  Fetal fibronectin     Status: None   Collection Time: 02/23/21 10:34 PM  Result Value Ref Range   Fetal Fibronectin NEGATIVE NEGATIVE   Results reviewed, within normal limits BPs have mostly been normal with two elevated readings.   Noted to be  having contractions. FFn done and resulted as negative Dilation: Closed Effacement (%): 0 Cervical Position: Posterior Station: Ballotable Presentation: Undeterminable Exam by:: Sotirios Navarro CNM Terbutaline given with good resolution of contractions   Assessment and Plan  A:  Single IUP at [redacted]w[redacted]d       Chronic hypertension in pregnancy       Headache, resolved with Tylenol       Preterm uterine contractions, resolved  P:   Discharge home        Continue meds as ordered        Preeclampsia precautions        Preterm labor precautions      Encouraged to return if she develops worsening of symptoms, increase in pain, fever, or other concerning symptoms.    Aviva Signs, CNM

## 2021-02-23 NOTE — MAU Provider Note (Incomplete Revision)
History     CSN: [id number]  Arrival date and time: 02/23/21 1929   Event Date/Time   First Provider Initiated Contact with Patient 02/23/21 2058      Chief Complaint  Patient presents with  . Hypertension   35 y.o. [id number] @29 .6 wks with SLE and CHTN presenting with HA and elevated BP. Reports getting high readings with her BP cuff but she doesn't think it's working. HA is frontal and rates pain 6/10. Has not taken anything for it. States her eyes feel heavy, no blurry vision or spots. Denies CP. Reports SOB with exertion. Reports good FM. No pregnancy complaints. She is currently taking Labetalol 400 bid.  OB History    Gravida  3   Para  2   Term      Preterm  2   AB  0   Living  1     SAB      IAB      Ectopic      Multiple      Live Births  2        Obstetric Comments  #2:C/s "her lupus was attacking the baby"  (CA) #1 8/10        Past Medical History:  Diagnosis Date  . Anxiety   . Arthritis   . Bipolar 1 disorder (HCC)   . Bipolar disorder (HCC)   . Chronic pain   . Depression   . Fibromyalgia   . Hypertension    'clonidine" for that  . Infection    UTI  . Lupus (HCC)   . Neutropenia (HCC)   . Suboxone maintenance treatment complicating pregnancy, antepartum [facility])     Past Surgical History:  Procedure Laterality Date  . CESAREAN SECTION    . WISDOM TOOTH EXTRACTION      Family History  Problem Relation Age of Onset  . Hypertension Mother   . Heart attack Father        In his 7s. Died from it.  . Depression Father   . Rheum arthritis Maternal Grandmother   . Lupus Maternal Grandmother   . Diabetes Maternal Grandmother   . Heart disease Maternal Grandfather   . Diabetes Maternal Grandfather   . Breast cancer Maternal Aunt   . Pancreatic cancer Maternal Uncle   . Diabetes Maternal Aunt   . Diabetes Maternal Aunt     Social History   Tobacco Use  . Smoking status: Current Every Day Smoker    Packs/day: 0.25     Years: 15.00    Pack years: 3.75    Types: Cigarettes  . Smokeless tobacco: Never Used  Vaping Use  . Vaping Use: Never used  Substance Use Topics  . Alcohol use: No  . Drug use: Yes    Types: Marijuana    Comment: takes Suboxone- but not daily    Allergies:  Allergies  Allergen Reactions  . Sulfa Antibiotics Swelling    Mouth/facial swelling  . Codeine Swelling    "not actually allergic, just doesn't want to take it  . Nsaids Other (See Comments)    Interacts with GERD    Medications Prior to Admission  Medication Sig Dispense Refill Last Dose  . Buprenorphine HCl-Naloxone HCl (SUBOXONE) 2-0.5 MG FILM Place 3 Film under the tongue daily for 14 days. In divided doses: 2 in AM and 1 in pm 42 each 0 02/23/2021 at Unknown time  . cephALEXin (KEFLEX) 500 MG capsule Take 1 capsule (500 mg total) by  mouth 4 (four) times daily. 28 capsule 2 02/23/2021 at Unknown time  . cloNIDine (CATAPRES) 0.2 MG tablet Take 1 tablet (0.2 mg total) by mouth at bedtime. 30 tablet 5 02/23/2021 at Unknown time  . hydroxychloroquine (PLAQUENIL) 200 MG tablet Take 2 tablets (400 mg total) by mouth daily. 60 tablet 5 02/23/2021 at Unknown time  . labetalol (NORMODYNE) 200 MG tablet Take 2 tablets (400 mg total) by mouth 2 (two) times daily. 120 tablet 3 02/23/2021 at Unknown time  . lamoTRIgine (LAMICTAL) 200 MG tablet Take 1 tablet (200 mg total) by mouth daily. 30 tablet 5 02/23/2021 at Unknown time  . metoCLOPramide (REGLAN) 10 MG tablet Take 1 tablet (10 mg total) by mouth 4 (four) times daily as needed for nausea or vomiting. 120 tablet 2 02/23/2021 at Unknown time  . omeprazole (PRILOSEC) 20 MG capsule TAKE 2 CAPSULES BY MOUTH TWICE DAILY AS NEEDED FOR ACID REFLUX. 120 capsule 3 02/23/2021 at Unknown time  . ondansetron (ZOFRAN ODT) 4 MG disintegrating tablet Take 1 tablet (4 mg total) by mouth every 6 (six) hours. 120 tablet 3 02/23/2021 at Unknown time  . predniSONE (DELTASONE) 20 MG tablet Take 1 tablet (20 mg  total) by mouth daily. 90 tablet 3 02/23/2021 at Unknown time  . Prenatal Vit-Fe Fumarate-FA (PREPLUS) 27-1 MG TABS Take 1 tablet by mouth daily. 30 tablet 13 02/23/2021 at Unknown time  . promethazine (PHENERGAN) 25 MG tablet Take 1 tablet (25 mg total) by mouth every 6 (six) hours as needed for nausea or vomiting. 60 tablet 3 02/23/2021 at Unknown time  . terconazole (TERAZOL 7) 0.4 % vaginal cream INSERT 1 APPLICATORFUL VAGINALLY AT BEDTIME FOR 3 DAYS 45 g 0 02/22/2021 at Unknown time  . lidocaine (XYLOCAINE) 2 % solution Use as directed 15 mLs in the mouth or throat as needed for mouth pain. (Patient not taking: Reported on 02/17/2021) 15 mL 0   . OLANZapine (ZYPREXA) 20 MG tablet Take 20 mg by mouth at bedtime. (Patient not taking: Reported on 02/17/2021)       Review of Systems  Eyes: Positive for visual disturbance.  Respiratory: Negative for shortness of breath.   Cardiovascular: Negative for chest pain.  Gastrointestinal: Negative for abdominal pain.  Genitourinary: Negative for vaginal bleeding.  Neurological: Positive for dizziness and headaches.   Physical Exam   Blood pressure 131/68, pulse 93, temperature 99 F (37.2 C), resp. rate 18, height 5\' 4"  (1.626 m), weight 73.9 kg, last menstrual period 07/29/2020, SpO2 98 %. Patient Vitals for the past 24 hrs:  BP Temp Pulse Resp SpO2 Height Weight  02/23/21 2115 120/73 - 98 - 98 % - -  02/23/21 2100 126/77 - 93 - 96 % - -  02/23/21 2056 131/68 - 93 - - - -  02/23/21 2029 132/72 - (!) 102 - 98 % 5\' 4"  (1.626 m) 73.9 kg  02/23/21 2026 - 99 F (37.2 C) - 18 - - -    Physical Exam Vitals and nursing note reviewed.  Constitutional:      General: She is not in acute distress.    Appearance: Normal appearance.  HENT:     Head: Normocephalic and atraumatic.  Cardiovascular:     Rate and Rhythm: Normal rate.  Pulmonary:     Effort: Pulmonary effort is normal. No respiratory distress.  Abdominal:     Palpations: Abdomen is soft.      Tenderness: There is no abdominal tenderness.     Comments: gravid  Musculoskeletal:        General: Normal range of motion.     Cervical back: Normal range of motion.  Skin:    General: Skin is warm and dry.  Neurological:     General: No focal deficit present.     Mental Status: She is alert and oriented to person, place, and time. Mental status is at baseline.     Cranial Nerves: No cranial nerve deficit.  Psychiatric:        Mood and Affect: Mood normal.        Behavior: Behavior normal.   EFM: 140 bpm, mod variability, no accels, rare variable decels Toco: UI  Results for orders placed or performed during the hospital encounter of 02/23/21 (from the past 24 hour(s))  Urinalysis, Routine w reflex microscopic Urine, Clean Catch     Status: Abnormal   Collection Time: 02/23/21  8:40 PM  Result Value Ref Range   Color, Urine YELLOW YELLOW   APPearance HAZY (A) CLEAR   Specific Gravity, Urine 1.024 1.005 - 1.030   pH 7.0 5.0 - 8.0   Glucose, UA NEGATIVE NEGATIVE mg/dL   Hgb urine dipstick NEGATIVE NEGATIVE   Bilirubin Urine NEGATIVE NEGATIVE   Ketones, ur 5 (A) NEGATIVE mg/dL   Protein, ur NEGATIVE NEGATIVE mg/dL   Nitrite NEGATIVE NEGATIVE   Leukocytes,Ua NEGATIVE NEGATIVE   MAU Course  Procedures Tylenol  MDM Labs ordered and pending. Transfer of care given to Lorenda Peck, [place]  02/23/2021 10:03 PM   Assessment and Plan

## 2021-02-24 ENCOUNTER — Encounter: Payer: Self-pay | Admitting: Family Medicine

## 2021-02-24 ENCOUNTER — Ambulatory Visit (INDEPENDENT_AMBULATORY_CARE_PROVIDER_SITE_OTHER): Payer: Medicaid Other | Admitting: Family Medicine

## 2021-02-24 VITALS — BP 131/72 | HR 91 | Wt 162.0 lb

## 2021-02-24 DIAGNOSIS — Z23 Encounter for immunization: Secondary | ICD-10-CM

## 2021-02-24 DIAGNOSIS — Z3A30 30 weeks gestation of pregnancy: Secondary | ICD-10-CM

## 2021-02-24 DIAGNOSIS — O10013 Pre-existing essential hypertension complicating pregnancy, third trimester: Secondary | ICD-10-CM

## 2021-02-24 DIAGNOSIS — M329 Systemic lupus erythematosus, unspecified: Secondary | ICD-10-CM

## 2021-02-24 DIAGNOSIS — O4703 False labor before 37 completed weeks of gestation, third trimester: Secondary | ICD-10-CM | POA: Diagnosis not present

## 2021-02-24 DIAGNOSIS — Z3A29 29 weeks gestation of pregnancy: Secondary | ICD-10-CM | POA: Diagnosis not present

## 2021-02-24 DIAGNOSIS — O169 Unspecified maternal hypertension, unspecified trimester: Secondary | ICD-10-CM

## 2021-02-24 DIAGNOSIS — F1120 Opioid dependence, uncomplicated: Secondary | ICD-10-CM

## 2021-02-24 DIAGNOSIS — O99891 Other specified diseases and conditions complicating pregnancy: Secondary | ICD-10-CM

## 2021-02-24 DIAGNOSIS — R87810 Cervical high risk human papillomavirus (HPV) DNA test positive: Secondary | ICD-10-CM

## 2021-02-24 DIAGNOSIS — R87610 Atypical squamous cells of undetermined significance on cytologic smear of cervix (ASC-US): Secondary | ICD-10-CM

## 2021-02-24 DIAGNOSIS — O0993 Supervision of high risk pregnancy, unspecified, third trimester: Secondary | ICD-10-CM | POA: Diagnosis not present

## 2021-02-24 DIAGNOSIS — O99320 Drug use complicating pregnancy, unspecified trimester: Secondary | ICD-10-CM

## 2021-02-24 DIAGNOSIS — O365920 Maternal care for other known or suspected poor fetal growth, second trimester, not applicable or unspecified: Secondary | ICD-10-CM

## 2021-02-24 DIAGNOSIS — D6862 Lupus anticoagulant syndrome: Secondary | ICD-10-CM

## 2021-02-24 DIAGNOSIS — O99119 Other diseases of the blood and blood-forming organs and certain disorders involving the immune mechanism complicating pregnancy, unspecified trimester: Secondary | ICD-10-CM

## 2021-02-24 LAB — CARBOXY-THC NORMALIZED RATIO
Carboxy-THC: 424 ng/mL
THC/CR Ratio: 719 ng/mg creat

## 2021-02-24 LAB — BUPRENORPHINE + DRUG SCREEN, UR
6-Acetylmorphine: NEGATIVE ng/mL
Amphetamines: NEGATIVE ng/mL
Barbiturates: NEGATIVE ng/mL
Benzodiazepines: NEGATIVE ng/mL
Buprenorphine: POSITIVE ng/mL — AB
Carisoprodol: NEGATIVE ng/mL
Cocaine Metabolite: NEGATIVE ng/mL
Creatinine: 59 mg/dL (ref >=20)
Ethyl Glucuronide: NEGATIVE ng/mL
Fentanyl: NEGATIVE ng/mL
Gabapentin: NEGATIVE ug/mL
Marijuana MTB (THC): POSITIVE ng/mL — AB
Methadone: NEGATIVE ng/mL
Nitrites: NEGATIVE ug/mL (ref <200)
Opiates: NEGATIVE ng/mL
Oxycodone: NEGATIVE ng/mL
Phencyclidine (PCP): NEGATIVE ng/mL
Propoxyphene: NEGATIVE ng/mL
Tapentadol: NEGATIVE ng/mL
Tramadol: NEGATIVE ng/mL
Urine pH: 8.8 (ref 4.5–8.9)

## 2021-02-24 LAB — BUPRENORPHINE/NALOXONE CONFIRM
Buprenorphine/CR: 42.4 ng/mg creat
Naloxone: 37 ng/ml
Norbup/Bup Ratio: 1.08 RATIO
Norbuprenorphine/CR: 45.8 ng/mg creat

## 2021-02-24 NOTE — Patient Instructions (Signed)
 Contraception Choices Contraception, also called birth control, refers to methods or devices that prevent pregnancy. Hormonal methods Contraceptive implant A contraceptive implant is a thin, plastic tube that contains a hormone that prevents pregnancy. It is different from an intrauterine device (IUD). It is inserted into the upper part of the arm by a health care provider. Implants can be effective for up to 3 years. Progestin-only injections Progestin-only injections are injections of progestin, a synthetic form of the hormone progesterone. They are given every 3 months by a health care provider. Birth control pills Birth control pills are pills that contain hormones that prevent pregnancy. They must be taken once a day, preferably at the same time each day. A prescription is needed to use this method of contraception. Birth control patch The birth control patch contains hormones that prevent pregnancy. It is placed on the skin and must be changed once a week for three weeks and removed on the fourth week. A prescription is needed to use this method of contraception. Vaginal ring A vaginal ring contains hormones that prevent pregnancy. It is placed in the vagina for three weeks and removed on the fourth week. After that, the process is repeated with a new ring. A prescription is needed to use this method of contraception. Emergency contraceptive Emergency contraceptives prevent pregnancy after unprotected sex. They come in pill form and can be taken up to 5 days after sex. They work best the sooner they are taken after having sex. Most emergency contraceptives are available without a prescription. This method should not be used as your only form of birth control.   Barrier methods Female condom A female condom is a thin sheath that is worn over the penis during sex. Condoms keep sperm from going inside a woman's body. They can be used with a sperm-killing substance (spermicide) to increase their  effectiveness. They should be thrown away after one use. Female condom A female condom is a soft, loose-fitting sheath that is put into the vagina before sex. The condom keeps sperm from going inside a woman's body. They should be thrown away after one use. Diaphragm A diaphragm is a soft, dome-shaped barrier. It is inserted into the vagina before sex, along with a spermicide. The diaphragm blocks sperm from entering the uterus, and the spermicide kills sperm. A diaphragm should be left in the vagina for 6-8 hours after sex and removed within 24 hours. A diaphragm is prescribed and fitted by a health care provider. A diaphragm should be replaced every 1-2 years, after giving birth, after gaining more than 15 lb (6.8 kg), and after pelvic surgery. Cervical cap A cervical cap is a round, soft latex or plastic cup that fits over the cervix. It is inserted into the vagina before sex, along with spermicide. It blocks sperm from entering the uterus. The cap should be left in place for 6-8 hours after sex and removed within 48 hours. A cervical cap must be prescribed and fitted by a health care provider. It should be replaced every 2 years. Sponge A sponge is a soft, circular piece of polyurethane foam with spermicide in it. The sponge helps block sperm from entering the uterus, and the spermicide kills sperm. To use it, you make it wet and then insert it into the vagina. It should be inserted before sex, left in for at least 6 hours after sex, and removed and thrown away within 30 hours. Spermicides Spermicides are chemicals that kill or block sperm from entering the   cervix and uterus. They can come as a cream, jelly, suppository, foam, or tablet. A spermicide should be inserted into the vagina with an applicator at least 10-15 minutes before sex to allow time for it to work. The process must be repeated every time you have sex. Spermicides do not require a prescription.   Intrauterine  contraception Intrauterine device (IUD) An IUD is a T-shaped device that is put in a woman's uterus. There are two types:  Hormone IUD.This type contains progestin, a synthetic form of the hormone progesterone. This type can stay in place for 3-5 years.  Copper IUD.This type is wrapped in copper wire. It can stay in place for 10 years. Permanent methods of contraception Female tubal ligation In this method, a woman's fallopian tubes are sealed, tied, or blocked during surgery to prevent eggs from traveling to the uterus. Hysteroscopic sterilization In this method, a small, flexible insert is placed into each fallopian tube. The inserts cause scar tissue to form in the fallopian tubes and block them, so sperm cannot reach an egg. The procedure takes about 3 months to be effective. Another form of birth control must be used during those 3 months. Female sterilization This is a procedure to tie off the tubes that carry sperm (vasectomy). After the procedure, the man can still ejaculate fluid (semen). Another form of birth control must be used for 3 months after the procedure. Natural planning methods Natural family planning In this method, a couple does not have sex on days when the woman could become pregnant. Calendar method In this method, the woman keeps track of the length of each menstrual cycle, identifies the days when pregnancy can happen, and does not have sex on those days. Ovulation method In this method, a couple avoids sex during ovulation. Symptothermal method This method involves not having sex during ovulation. The woman typically checks for ovulation by watching changes in her temperature and in the consistency of cervical mucus. Post-ovulation method In this method, a couple waits to have sex until after ovulation. Where to find more information  Centers for Disease Control and Prevention: www.cdc.gov Summary  Contraception, also called birth control, refers to methods or  devices that prevent pregnancy.  Hormonal methods of contraception include implants, injections, pills, patches, vaginal rings, and emergency contraceptives.  Barrier methods of contraception can include female condoms, female condoms, diaphragms, cervical caps, sponges, and spermicides.  There are two types of IUDs (intrauterine devices). An IUD can be put in a woman's uterus to prevent pregnancy for 3-5 years.  Permanent sterilization can be done through a procedure for males and females. Natural family planning methods involve nothaving sex on days when the woman could become pregnant. This information is not intended to replace advice given to you by your health care provider. Make sure you discuss any questions you have with your health care provider. Document Revised: 02/24/2020 Document Reviewed: 02/24/2020 Elsevier Patient Education  2021 [employer]   Breastfeeding  Choosing to breastfeed is one of the best decisions you can make for yourself and your baby. A change in hormones during pregnancy causes your breasts to make breast milk in your milk-producing glands. Hormones prevent breast milk from being released before your baby is born. They also prompt milk flow after birth. Once breastfeeding has begun, thoughts of your baby, as well as his or her sucking or crying, can stimulate the release of milk from your milk-producing glands. Benefits of breastfeeding Research shows that breastfeeding offers many health benefits   for infants and mothers. It also offers a cost-free and convenient way to feed your baby. For your baby  Your first milk (colostrum) helps your baby's digestive system to function better.  Special cells in your milk (antibodies) help your baby to fight off infections.  Breastfed babies are less likely to develop asthma, allergies, obesity, or type 2 diabetes. They are also at lower risk for sudden infant death syndrome (SIDS).  Nutrients in breast milk are better  able to meet your baby's needs compared to infant formula.  Breast milk improves your baby's brain development. For you  Breastfeeding helps to create a very special bond between you and your baby.  Breastfeeding is convenient. Breast milk costs nothing and is always available at the correct temperature.  Breastfeeding helps to burn calories. It helps you to lose the weight that you gained during pregnancy.  Breastfeeding makes your uterus return faster to its size before pregnancy. It also slows bleeding (lochia) after you give birth.  Breastfeeding helps to lower your risk of developing type 2 diabetes, osteoporosis, rheumatoid arthritis, cardiovascular disease, and breast, ovarian, uterine, and endometrial cancer later in life. Breastfeeding basics Starting breastfeeding  Find a comfortable place to sit or lie down, with your neck and back well-supported.  Place a pillow or a rolled-up blanket under your baby to bring him or her to the level of your breast (if you are seated). Nursing pillows are specially designed to help support your arms and your baby while you breastfeed.  Make sure that your baby's tummy (abdomen) is facing your abdomen.  Gently massage your breast. With your fingertips, massage from the outer edges of your breast inward toward the nipple. This encourages milk flow. If your milk flows slowly, you may need to continue this action during the feeding.  Support your breast with 4 fingers underneath and your thumb above your nipple (make the letter "C" with your hand). Make sure your fingers are well away from your nipple and your baby's mouth.  Stroke your baby's lips gently with your finger or nipple.  When your baby's mouth is open wide enough, quickly bring your baby to your breast, placing your entire nipple and as much of the areola as possible into your baby's mouth. The areola is the colored area around your nipple. ? More areola should be visible above your  baby's upper lip than below the lower lip. ? Your baby's lips should be opened and extended outward (flanged) to ensure an adequate, comfortable latch. ? Your baby's tongue should be between his or her lower gum and your breast.  Make sure that your baby's mouth is correctly positioned around your nipple (latched). Your baby's lips should create a seal on your breast and be turned out (everted).  It is common for your baby to suck about 2-3 minutes in order to start the flow of breast milk. Latching Teaching your baby how to latch onto your breast properly is very important. An improper latch can cause nipple pain, decreased milk supply, and poor weight gain in your baby. Also, if your baby is not latched onto your nipple properly, he or she may swallow some air during feeding. This can make your baby fussy. Burping your baby when you switch breasts during the feeding can help to get rid of the air. However, teaching your baby to latch on properly is still the best way to prevent fussiness from swallowing air while breastfeeding. Signs that your baby has successfully latched onto   your nipple  Silent tugging or silent sucking, without causing you pain. Infant's lips should be extended outward (flanged).  Swallowing heard between every 3-4 sucks once your milk has started to flow (after your let-down milk reflex occurs).  Muscle movement above and in front of his or her ears while sucking. Signs that your baby has not successfully latched onto your nipple  Sucking sounds or smacking sounds from your baby while breastfeeding.  Nipple pain. If you think your baby has not latched on correctly, slip your finger into the corner of your baby's mouth to break the suction and place it between your baby's gums. Attempt to start breastfeeding again. Signs of successful breastfeeding Signs from your baby  Your baby will gradually decrease the number of sucks or will completely stop sucking.  Your baby  will fall asleep.  Your baby's body will relax.  Your baby will retain a small amount of milk in his or her mouth.  Your baby will let go of your breast by himself or herself. Signs from you  Breasts that have increased in firmness, weight, and size 1-3 hours after feeding.  Breasts that are softer immediately after breastfeeding.  Increased milk volume, as well as a change in milk consistency and color by the fifth day of breastfeeding.  Nipples that are not sore, cracked, or bleeding. Signs that your baby is getting enough milk  Wetting at least 1-2 diapers during the first 24 hours after birth.  Wetting at least 5-6 diapers every 24 hours for the first week after birth. The urine should be clear or pale yellow by the age of 5 days.  Wetting 6-8 diapers every 24 hours as your baby continues to grow and develop.  At least 3 stools in a 24-hour period by the age of 5 days. The stool should be soft and yellow.  At least 3 stools in a 24-hour period by the age of 7 days. The stool should be seedy and yellow.  No loss of weight greater than 10% of birth weight during the first 3 days of life.  Average weight gain of 4-7 oz (113-198 g) per week after the age of 4 days.  Consistent daily weight gain by the age of 5 days, without weight loss after the age of 2 weeks. After a feeding, your baby may spit up a small amount of milk. This is normal. Breastfeeding frequency and duration Frequent feeding will help you make more milk and can prevent sore nipples and extremely full breasts (breast engorgement). Breastfeed when you feel the need to reduce the fullness of your breasts or when your baby shows signs of hunger. This is called "breastfeeding on demand." Signs that your baby is hungry include:  Increased alertness, activity, or restlessness.  Movement of the head from side to side.  Opening of the mouth when the corner of the mouth or cheek is stroked (rooting).  Increased  sucking sounds, smacking lips, cooing, sighing, or squeaking.  Hand-to-mouth movements and sucking on fingers or hands.  Fussing or crying. Avoid introducing a pacifier to your baby in the first 4-6 weeks after your baby is born. After this time, you may choose to use a pacifier. Research has shown that pacifier use during the first year of a baby's life decreases the risk of sudden infant death syndrome (SIDS). Allow your baby to feed on each breast as long as he or she wants. When your baby unlatches or falls asleep while feeding from the   first breast, offer the second breast. Because newborns are often sleepy in the first few weeks of life, you may need to awaken your baby to get him or her to feed. Breastfeeding times will vary from baby to baby. However, the following rules can serve as a guide to help you make sure that your baby is properly fed:  Newborns (babies 4 weeks of age or younger) may breastfeed every 1-3 hours.  Newborns should not go without breastfeeding for longer than 3 hours during the day or 5 hours during the night.  You should breastfeed your baby a minimum of 8 times in a 24-hour period. Breast milk pumping Pumping and storing breast milk allows you to make sure that your baby is exclusively fed your breast milk, even at times when you are unable to breastfeed. This is especially important if you go back to work while you are still breastfeeding, or if you are not able to be present during feedings. Your lactation consultant can help you find a method of pumping that works best for you and give you guidelines about how long it is safe to store breast milk.      Caring for your breasts while you breastfeed Nipples can become dry, cracked, and sore while breastfeeding. The following recommendations can help keep your breasts moisturized and healthy:  Avoid using soap on your nipples.  Wear a supportive bra designed especially for nursing. Avoid wearing underwire-style  bras or extremely tight bras (sports bras).  Air-dry your nipples for 3-4 minutes after each feeding.  Use only cotton bra pads to absorb leaked breast milk. Leaking of breast milk between feedings is normal.  Use lanolin on your nipples after breastfeeding. Lanolin helps to maintain your skin's normal moisture barrier. Pure lanolin is not harmful (not toxic) to your baby. You may also hand express a few drops of breast milk and gently massage that milk into your nipples and allow the milk to air-dry. In the first few weeks after giving birth, some women experience breast engorgement. Engorgement can make your breasts feel heavy, warm, and tender to the touch. Engorgement peaks within 3-5 days after you give birth. The following recommendations can help to ease engorgement:  Completely empty your breasts while breastfeeding or pumping. You may want to start by applying warm, moist heat (in the shower or with warm, water-soaked hand towels) just before feeding or pumping. This increases circulation and helps the milk flow. If your baby does not completely empty your breasts while breastfeeding, pump any extra milk after he or she is finished.  Apply ice packs to your breasts immediately after breastfeeding or pumping, unless this is too uncomfortable for you. To do this: ? Put ice in a plastic bag. ? Place a towel between your skin and the bag. ? Leave the ice on for 20 minutes, 2-3 times a day.  Make sure that your baby is latched on and positioned properly while breastfeeding. If engorgement persists after 48 hours of following these recommendations, contact your health care provider or a lactation consultant. Overall health care recommendations while breastfeeding  Eat 3 healthy meals and 3 snacks every day. Well-nourished mothers who are breastfeeding need an additional 450-500 calories a day. You can meet this requirement by increasing the amount of a balanced diet that you eat.  Drink  enough water to keep your urine pale yellow or clear.  Rest often, relax, and continue to take your prenatal vitamins to prevent fatigue, stress, and low   vitamin and mineral levels in your body (nutrient deficiencies).  Do not use any products that contain nicotine or tobacco, such as cigarettes and e-cigarettes. Your baby may be harmed by chemicals from cigarettes that pass into breast milk and exposure to secondhand smoke. If you need help quitting, ask your health care provider.  Avoid alcohol.  Do not use illegal drugs or marijuana.  Talk with your health care provider before taking any medicines. These include over-the-counter and prescription medicines as well as vitamins and herbal supplements. Some medicines that may be harmful to your baby can pass through breast milk.  It is possible to become pregnant while breastfeeding. If birth control is desired, ask your health care provider about options that will be safe while breastfeeding your baby. Where to find more information: [employer]: www.llli.org Contact a health care provider if:  You feel like you want to stop breastfeeding or have become frustrated with breastfeeding.  Your nipples are cracked or bleeding.  Your breasts are red, tender, or warm.  You have: ? Painful breasts or nipples. ? A swollen area on either breast. ? A fever or chills. ? Nausea or vomiting. ? Drainage other than breast milk from your nipples.  Your breasts do not become full before feedings by the fifth day after you give birth.  You feel sad and depressed.  Your baby is: ? Too sleepy to eat well. ? Having trouble sleeping. ? More than 1 week old and wetting fewer than 6 diapers in a 24-hour period. ? Not gaining weight by 5 days of age.  Your baby has fewer than 3 stools in a 24-hour period.  Your baby's skin or the white parts of his or her eyes become yellow. Get help right away if:  Your baby is overly tired  (lethargic) and does not want to wake up and feed.  Your baby develops an unexplained fever. Summary  Breastfeeding offers many health benefits for infant and mothers.  Try to breastfeed your infant when he or she shows early signs of hunger.  Gently tickle or stroke your baby's lips with your finger or nipple to allow the baby to open his or her mouth. Bring the baby to your breast. Make sure that much of the areola is in your baby's mouth. Offer one side and burp the baby before you offer the other side.  Talk with your health care provider or lactation consultant if you have questions or you face problems as you breastfeed. This information is not intended to replace advice given to you by your health care provider. Make sure you discuss any questions you have with your health care provider. Document Revised: 12/14/2017 Document Reviewed: 10/21/2016 Else[employer] Patient Education  2021 [employer]  

## 2021-02-24 NOTE — Progress Notes (Signed)
   Subjective:  Wendy Herman is a 35 y.o. G3P0201 at [redacted]w[redacted]d being seen today for ongoing prenatal care.  She is currently monitored for the following issues for this high-risk pregnancy and has Chest pain; Neutropenia (HCC); Anemia; SLE (systemic lupus erythematosus) (HCC); Discoid lupus erythematosus; Bipolar affective disorder, current episode hypomanic (HCC); Vitamin D insufficiency; Bipolar 1 disorder with moderate mania (HCC); Supervision of high-risk pregnancy; History of preterm delivery, currently pregnant; Tobacco smoking affecting pregnancy, antepartum; Hypertension affecting pregnancy, antepartum; Lupus anticoagulant affecting pregnancy, antepartum (HCC); Suboxone maintenance treatment complicating pregnancy, antepartum, unspecified trimester (HCC); ASCUS with positive high risk HPV cervical; Systemic lupus erythematosus affecting pregnancy (HCC); IUGR (intrauterine growth restriction) affecting care of mother, second trimester; and Vaginal bleeding in pregnancy on their problem list.  Patient reports visit to MAU last night.  Contractions: Not present. Vag. Bleeding: None.  Movement: Present. Denies leaking of fluid.   The following portions of the patient's history were reviewed and updated as appropriate: allergies, current medications, past family history, past medical history, past social history, past surgical history and problem list. Problem list updated.  Objective:   Vitals:   02/24/21 1559  BP: 131/72  Pulse: 91  Weight: 162 lb (73.5 kg)    Fetal Status: Fetal Heart Rate (bpm): 136   Movement: Present     General:  Alert, oriented and cooperative. Patient is in no acute distress.  Skin: Skin is warm and dry. No rash noted.   Cardiovascular: Normal heart rate noted  Respiratory: Normal respiratory effort, no problems with respiration noted  Abdomen: Soft, gravid, appropriate for gestational age. Pain/Pressure: Absent     Pelvic: Vag. Bleeding: None     Cervical exam  deferred        Extremities: Normal range of motion.  Edema: None  Mental Status: Normal mood and affect. Normal behavior. Normal judgment and thought content.   Urinalysis:      Assessment and Plan:  Pregnancy: G3P0201 at [redacted]w[redacted]d  1. [redacted] weeks gestation of pregnancy   2. Supervision of high risk pregnancy in third trimester BP well controlled today, FHR normal Seen in MAU last night for elevated BP and headache, benign labs and headache resolved with treatment, BP's not severe range Also contracting, FFN negative and resolved with dose of terbutaline  3. Hypertension affecting pregnancy, antepartum BP much improved on labetalol 400mg  BID  4. Lupus anticoagulant affecting pregnancy, antepartum (HCC) Mild positive Beta-2 IgM, remainder of labs negative  5. Suboxone maintenance treatment complicating pregnancy, antepartum, unspecified trimester (HCC) Stable on 6mg  daily UDS today  6. ASCUS with positive high risk HPV cervical colpo PP  7. Systemic lupus erythematosus affecting pregnancy (HCC) Neg SSA/SSB Ab's early in pregnancy Diagnosed with flare at last visit and placed on steroid taper Today reports flare improving, still tapering down Following w MFM  8. IUGR (intrauterine growth restriction) affecting care of mother, second trimester Last growth 02/19/2021, EFW 6% with elevated dopplers but no AEDF/REDF Continue weekly antenatal testing per MFM Answered many questions regarding this diagnosis and its significance Discussed that delivery date may move up depending on clinical course  Preterm labor symptoms and general obstetric precautions including but not limited to vaginal bleeding, contractions, leaking of fluid and fetal movement were reviewed in detail with the patient. Please refer to After Visit Summary for other counseling recommendations.  Return in 2 weeks (on 03/10/2021) for [facility], ob visit, needs MD.   05/10/2021, MD

## 2021-02-24 NOTE — Progress Notes (Signed)
30 wee

## 2021-02-25 ENCOUNTER — Ambulatory Visit: Payer: Medicaid Other | Attending: Obstetrics

## 2021-02-25 ENCOUNTER — Ambulatory Visit: Payer: Medicaid Other

## 2021-02-25 LAB — PAIN MGT SCRN (14 DRUGS), UR
Amphetamine Scrn, Ur: NEGATIVE ng/mL
BARBITURATE SCREEN URINE: NEGATIVE ng/mL
BENZODIAZEPINE SCREEN, URINE: NEGATIVE ng/mL
Buprenorphine, Urine: POSITIVE ng/mL — AB
CANNABINOIDS UR QL SCN: POSITIVE ng/mL — AB
Cocaine (Metab) Scrn, Ur: NEGATIVE ng/mL
Creatinine(Crt), U: 94.5 mg/dL (ref 20.0–300.0)
Fentanyl, Urine: NEGATIVE pg/mL
Meperidine Screen, Urine: NEGATIVE ng/mL
Methadone Screen, Urine: NEGATIVE ng/mL
OXYCODONE+OXYMORPHONE UR QL SCN: NEGATIVE ng/mL
Opiate Scrn, Ur: NEGATIVE ng/mL
Ph of Urine: 6.5 (ref 4.5–8.9)
Phencyclidine Qn, Ur: NEGATIVE ng/mL
Propoxyphene Scrn, Ur: NEGATIVE ng/mL
Tramadol Screen, Urine: NEGATIVE ng/mL

## 2021-02-26 ENCOUNTER — Telehealth: Payer: Self-pay

## 2021-02-26 NOTE — Telephone Encounter (Signed)
Pt called stating she is having a stinging sensation when she urinates x 2 days. Pt made aware that if symptoms persists she can go to [facility] at [facility] to be seen. Understanding was voiced. Allon Costlow l Kowen Kluth, CMA

## 2021-02-28 ENCOUNTER — Other Ambulatory Visit: Payer: Self-pay | Admitting: Family Medicine

## 2021-03-02 ENCOUNTER — Other Ambulatory Visit: Payer: Self-pay

## 2021-03-02 ENCOUNTER — Inpatient Hospital Stay (HOSPITAL_COMMUNITY): Payer: Medicaid Other

## 2021-03-02 ENCOUNTER — Encounter (HOSPITAL_COMMUNITY): Payer: Self-pay | Admitting: Family Medicine

## 2021-03-02 ENCOUNTER — Inpatient Hospital Stay (HOSPITAL_COMMUNITY)
Admission: AD | Admit: 2021-03-02 | Discharge: 2021-03-02 | Disposition: A | Discharge status: Home / Self Care | Payer: Medicaid Other | Attending: Family Medicine | Admitting: Family Medicine

## 2021-03-02 DIAGNOSIS — F319 Bipolar disorder, unspecified: Secondary | ICD-10-CM | POA: Diagnosis not present

## 2021-03-02 DIAGNOSIS — O26893 Other specified pregnancy related conditions, third trimester: Secondary | ICD-10-CM | POA: Insufficient documentation

## 2021-03-02 DIAGNOSIS — F17210 Nicotine dependence, cigarettes, uncomplicated: Secondary | ICD-10-CM | POA: Diagnosis not present

## 2021-03-02 DIAGNOSIS — M797 Fibromyalgia: Secondary | ICD-10-CM | POA: Insufficient documentation

## 2021-03-02 DIAGNOSIS — O99333 Smoking (tobacco) complicating pregnancy, third trimester: Secondary | ICD-10-CM | POA: Diagnosis not present

## 2021-03-02 DIAGNOSIS — R109 Unspecified abdominal pain: Secondary | ICD-10-CM | POA: Diagnosis not present

## 2021-03-02 DIAGNOSIS — O10913 Unspecified pre-existing hypertension complicating pregnancy, third trimester: Secondary | ICD-10-CM | POA: Insufficient documentation

## 2021-03-02 DIAGNOSIS — O99343 Other mental disorders complicating pregnancy, third trimester: Secondary | ICD-10-CM | POA: Diagnosis not present

## 2021-03-02 DIAGNOSIS — Z79899 Other long term (current) drug therapy: Secondary | ICD-10-CM | POA: Insufficient documentation

## 2021-03-02 DIAGNOSIS — O99891 Other specified diseases and conditions complicating pregnancy: Secondary | ICD-10-CM | POA: Diagnosis not present

## 2021-03-02 DIAGNOSIS — R300 Dysuria: Secondary | ICD-10-CM | POA: Diagnosis not present

## 2021-03-02 DIAGNOSIS — Z7952 Long term (current) use of systemic steroids: Secondary | ICD-10-CM | POA: Diagnosis not present

## 2021-03-02 DIAGNOSIS — R319 Hematuria, unspecified: Secondary | ICD-10-CM | POA: Diagnosis not present

## 2021-03-02 DIAGNOSIS — Z3A30 30 weeks gestation of pregnancy: Secondary | ICD-10-CM | POA: Diagnosis not present

## 2021-03-02 LAB — URINALYSIS, ROUTINE W REFLEX MICROSCOPIC
Bilirubin Urine: NEGATIVE
Glucose, UA: NEGATIVE mg/dL
Hgb urine dipstick: NEGATIVE
Ketones, ur: NEGATIVE mg/dL
Nitrite: NEGATIVE
Protein, ur: NEGATIVE mg/dL
Specific Gravity, Urine: 1.016 (ref 1.005–1.030)
pH: 7 (ref 5.0–8.0)

## 2021-03-02 LAB — CBC WITH DIFFERENTIAL/PLATELET
Abs Immature Granulocytes: 0.08 10*3/uL — ABNORMAL HIGH (ref 0.00–0.07)
Basophils Absolute: 0 10*3/uL (ref 0.0–0.1)
Basophils Relative: 0 %
Eosinophils Absolute: 0.1 10*3/uL (ref 0.0–0.5)
Eosinophils Relative: 1 %
HCT: 33.3 % — ABNORMAL LOW (ref 36.0–46.0)
Hemoglobin: 11.1 g/dL — ABNORMAL LOW (ref 12.0–15.0)
Immature Granulocytes: 1 %
Lymphocytes Relative: 22 %
Lymphs Abs: 2.2 10*3/uL (ref 0.7–4.0)
MCH: 29.7 pg (ref 26.0–34.0)
MCHC: 33.3 g/dL (ref 30.0–36.0)
MCV: 89 fL (ref 80.0–100.0)
Monocytes Absolute: 0.8 10*3/uL (ref 0.1–1.0)
Monocytes Relative: 8 %
Neutro Abs: 6.9 10*3/uL (ref 1.7–7.7)
Neutrophils Relative %: 68 %
Platelets: 221 10*3/uL (ref 150–400)
RBC: 3.74 MIL/uL — ABNORMAL LOW (ref 3.87–5.11)
RDW: 13.5 % (ref 11.5–15.5)
WBC: 10.1 10*3/uL (ref 4.0–10.5)
nRBC: 0 % (ref 0.0–0.2)

## 2021-03-02 NOTE — MAU Provider Note (Signed)
History     CSN: [id number]  Arrival date and time: 03/02/21 1308   Event Date/Time   First Provider Initiated Contact with Patient 03/02/21 2009      Chief Complaint  Patient presents with  . Dysuria   HPI Wendy Herman is a 35 y.o. G3P0201 at [redacted]w[redacted]d who presents with painful urination. She states each time she urinates, she is having pain at her urethra. She also reports back pain that she rates a 3/10 and has not tried anything for the pain. She states she was treated for a UTI in mid May and felt better for 2 days but feels like the pain came back right away. She also thinks she may have seen blood in her urine. She is concerned about kidney stones. She denies any abdominal pain, vaginal bleeding or discharge. Reports normal fetal movement.   OB History    Gravida  3   Para  2   Term      Preterm  2   AB  0   Living  1     SAB      IAB      Ectopic      Multiple      Live Births  2        Obstetric Comments  #2:C/s "her lupus was attacking the baby"  (CA) #1 Kendell Bane        Past Medical History:  Diagnosis Date  . Anxiety   . Arthritis   . Bipolar 1 disorder (HCC)   . Bipolar disorder (HCC)   . Chronic pain   . Depression   . Fibromyalgia   . Hypertension    'clonidine" for that  . Infection    UTI  . Lupus (HCC)   . Neutropenia (HCC)   . Suboxone maintenance treatment complicating pregnancy, antepartum [facility])     Past Surgical History:  Procedure Laterality Date  . CESAREAN SECTION    . WISDOM TOOTH EXTRACTION      Family History  Problem Relation Age of Onset  . Hypertension Mother   . Heart attack Father        In his 33s. Died from it.  . Depression Father   . Rheum arthritis Maternal Grandmother   . Lupus Maternal Grandmother   . Diabetes Maternal Grandmother   . Heart disease Maternal Grandfather   . Diabetes Maternal Grandfather   . Breast cancer Maternal Aunt   . Pancreatic cancer Maternal Uncle   . Diabetes  Maternal Aunt   . Diabetes Maternal Aunt     Social History   Tobacco Use  . Smoking status: Current Every Day Smoker    Packs/day: 0.25    Years: 15.00    Pack years: 3.75    Types: Cigarettes  . Smokeless tobacco: Never Used  Vaping Use  . Vaping Use: Never used  Substance Use Topics  . Alcohol use: No  . Drug use: Yes    Types: Marijuana    Comment: takes Suboxone- but not daily    Allergies:  Allergies  Allergen Reactions  . Sulfa Antibiotics Swelling    Mouth/facial swelling  . Codeine Swelling    "not actually allergic, just doesn't want to take it  . Nsaids Other (See Comments)    Interacts with GERD    Medications Prior to Admission  Medication Sig Dispense Refill Last Dose  . cloNIDine (CATAPRES) 0.2 MG tablet Take 1 tablet (0.2 mg total) by mouth at bedtime.  30 tablet 5 03/02/2021 at Unknown time  . hydroxychloroquine (PLAQUENIL) 200 MG tablet Take 2 tablets (400 mg total) by mouth daily. 60 tablet 5 03/02/2021 at Unknown time  . labetalol (NORMODYNE) 200 MG tablet Take 2 tablets (400 mg total) by mouth 2 (two) times daily. 120 tablet 3 03/02/2021 at Unknown time  . lamoTRIgine (LAMICTAL) 200 MG tablet Take 1 tablet (200 mg total) by mouth daily. 30 tablet 5 03/02/2021 at Unknown time  . omeprazole (PRILOSEC) 20 MG capsule TAKE 2 CAPSULES BY MOUTH TWICE DAILY AS NEEDED FOR ACID REFLUX. 120 capsule 3 03/02/2021 at Unknown time  . ondansetron (ZOFRAN ODT) 4 MG disintegrating tablet Take 1 tablet (4 mg total) by mouth every 6 (six) hours. 120 tablet 3 03/02/2021 at Unknown time  . Prenatal Vit-Fe Fumarate-FA (PREPLUS) 27-1 MG TABS Take 1 tablet by mouth daily. 30 tablet 13 03/02/2021 at Unknown time  . promethazine (PHENERGAN) 25 MG tablet Take 1 tablet (25 mg total) by mouth every 6 (six) hours as needed for nausea or vomiting. 60 tablet 3 03/02/2021 at Unknown time  . Buprenorphine HCl-Naloxone HCl (SUBOXONE) 2-0.5 MG FILM Place 3 Film under the tongue daily for 14 days.  In divided doses: 2 in AM and 1 in pm 42 each 0   . cephALEXin (KEFLEX) 500 MG capsule Take 1 capsule (500 mg total) by mouth 4 (four) times daily. 28 capsule 2   . lidocaine (XYLOCAINE) 2 % solution Use as directed 15 mLs in the mouth or throat as needed for mouth pain. (Patient not taking: No sig reported) 15 mL 0   . metoCLOPramide (REGLAN) 10 MG tablet Take 1 tablet (10 mg total) by mouth 4 (four) times daily as needed for nausea or vomiting. 120 tablet 2   . OLANZapine (ZYPREXA) 20 MG tablet Take 20 mg by mouth at bedtime. (Patient not taking: No sig reported)     . polyethylene glycol powder (GLYCOLAX/MIRALAX) 17 GM/SCOOP powder TAKE 17 G BY MOUTH 2 (TWO) TIMES DAILY AS NEEDED. 510 g 1   . predniSONE (DELTASONE) 20 MG tablet Take 1 tablet (20 mg total) by mouth daily. 90 tablet 3   . terconazole (TERAZOL 7) 0.4 % vaginal cream INSERT 1 APPLICATORFUL VAGINALLY AT BEDTIME FOR 3 DAYS 45 g 0     Review of Systems  Constitutional: Negative.  Negative for fatigue and fever.  HENT: Negative.   Respiratory: Negative.  Negative for shortness of breath.   Cardiovascular: Negative.  Negative for chest pain.  Gastrointestinal: Negative.  Negative for abdominal pain, constipation, diarrhea, nausea and vomiting.  Genitourinary: Positive for dysuria, frequency and hematuria. Negative for vaginal bleeding and vaginal discharge.  Musculoskeletal: Positive for back pain.  Neurological: Negative.  Negative for dizziness and headaches.   Physical Exam   Blood pressure 137/71, pulse 94, temperature 98.7 F (37.1 C), resp. rate 18, height 5\' 4"  (1.626 m), weight 75.3 kg, last menstrual period 07/29/2020.  Physical Exam Vitals and nursing note reviewed.  Constitutional:      General: She is not in acute distress.    Appearance: She is well-developed.  HENT:     Head: Normocephalic.  Eyes:     Pupils: Pupils are equal, round, and reactive to light.  Cardiovascular:     Rate and Rhythm: Normal rate  and regular rhythm.     Heart sounds: Normal heart sounds.  Pulmonary:     Effort: Pulmonary effort is normal. No respiratory distress.     Breath  sounds: Normal breath sounds.  Abdominal:     General: Bowel sounds are normal. There is no distension.     Palpations: Abdomen is soft.     Tenderness: There is no abdominal tenderness.  Skin:    General: Skin is warm and dry.  Neurological:     Mental Status: She is alert and oriented to person, place, and time.  Psychiatric:        Behavior: Behavior normal.        Thought Content: Thought content normal.        Judgment: Judgment normal.    Fetal Tracing:  Baseline: 140 Variability: moderate  Accels: 10x10 Decels: none  Toco: none    MAU Course  Procedures Results for orders placed or performed during the hospital encounter of 03/02/21 (from the past 24 hour(s))  Urinalysis, Routine w reflex microscopic Urine, Clean Catch     Status: Abnormal   Collection Time: 03/02/21  7:42 PM  Result Value Ref Range   Color, Urine YELLOW YELLOW   APPearance HAZY (A) CLEAR   Specific Gravity, Urine 1.016 1.005 - 1.030   pH 7.0 5.0 - 8.0   Glucose, UA NEGATIVE NEGATIVE mg/dL   Hgb urine dipstick NEGATIVE NEGATIVE   Bilirubin Urine NEGATIVE NEGATIVE   Ketones, ur NEGATIVE NEGATIVE mg/dL   Protein, ur NEGATIVE NEGATIVE mg/dL   Nitrite NEGATIVE NEGATIVE   Leukocytes,Ua TRACE (A) NEGATIVE   RBC / HPF 0-5 0 - 5 RBC/hpf   WBC, UA 6-10 0 - 5 WBC/hpf   Bacteria, UA RARE (A) NONE SEEN   Squamous Epithelial / LPF 0-5 0 - 5  CBC with Differential/Platelet     Status: Abnormal   Collection Time: 03/02/21  8:53 PM  Result Value Ref Range   WBC 10.1 4.0 - 10.5 K/uL   RBC 3.74 (L) 3.87 - 5.11 MIL/uL   Hemoglobin 11.1 (L) 12.0 - 15.0 g/dL   HCT 40.933.3 (L) 81.136.0 - 91.446.0 %   MCV 89.0 80.0 - 100.0 fL   MCH 29.7 26.0 - 34.0 pg   MCHC 33.3 30.0 - 36.0 g/dL   RDW 78.213.5 95.611.5 - 21.315.5 %   Platelets 221 150 - 400 K/uL   nRBC 0.0 0.0 - 0.2 %    Neutrophils Relative % 68 %   Neutro Abs 6.9 1.7 - 7.7 K/uL   Lymphocytes Relative 22 %   Lymphs Abs 2.2 0.7 - 4.0 K/uL   Monocytes Relative 8 %   Monocytes Absolute 0.8 0.1 - 1.0 K/uL   Eosinophils Relative 1 %   Eosinophils Absolute 0.1 0.0 - 0.5 K/uL   Basophils Relative 0 %   Basophils Absolute 0.0 0.0 - 0.1 K/uL   Immature Granulocytes 1 %   Abs Immature Granulocytes 0.08 (H) 0.00 - 0.07 K/uL   US Renal  Result Date: 03/02/2021 CLINICAL DATA:  Right flank pain, hematuria EXAM: RENAL / URINARY TRACT ULTRASOUND COMPLETE COMPARISON:  None. FINDINGS: Right Kidney: Renal measurements: 10.3 x 4.4 x 4.6 cm = volume: 110 mL. Echogenicity within normal limits. No mass or hydronephrosis visualized. Left Kidney: Renal measurements: 10.7 x 6.4 x 5.6 cm = volume: 203 mL. Echogenicity within normal limits. No mass or hydronephrosis visualized. Bladder: Appears normal for degree of bladder distention. Other: None. IMPRESSION: No acute findings.  No hydronephrosis. Electronically Signed   By: Charlett NoseKevin  Dover M.D.   On: 03/02/2021 21:21   MDM UA, UC CBC with Diff US Renal Patient declines pain medication  Reassurance provided  of normalcy of exam. Patient asking if ok to take previously prescribed pyridium. Discussed increasing PO hydration and patient has appointment Friday and can reassess as needed.  Assessment and Plan   1. Dysuria during pregnancy in third trimester   2. [redacted] weeks gestation of pregnancy    -Discharge home in stable condition -Third trimester precautions discussed -Patient advised to follow-up with OB as scheduled for prenatal care -Patient may return to MAU as needed or if her condition were to change or worsen   Rolm Bookbinder CNM 03/02/2021, 8:09 PM

## 2021-03-02 NOTE — MAU Note (Signed)
Pt was here  about 2 weeks ago with urinary symptoms. Pt feels it has not resolved. Reports painful urination and back pain. Has seen some blood in her urine as well. Thinks she might have a kidney stone.  Good fetal movement felt no vag bleeding or leaking.

## 2021-03-02 NOTE — Discharge Instructions (Signed)

## 2021-03-03 ENCOUNTER — Other Ambulatory Visit: Payer: Self-pay | Admitting: Family Medicine

## 2021-03-03 MED ORDER — BUPRENORPHINE HCL-NALOXONE HCL 2-0.5 MG SL FILM: 3.0000 | ORAL_FILM | Freq: Every day | SUBLINGUAL | 0 refills | Status: DC

## 2021-03-04 ENCOUNTER — Ambulatory Visit: Payer: Self-pay | Admitting: Nurse Practitioner

## 2021-03-04 LAB — CULTURE, OB URINE

## 2021-03-05 ENCOUNTER — Ambulatory Visit: Payer: Medicaid Other | Admitting: *Deleted

## 2021-03-05 ENCOUNTER — Other Ambulatory Visit: Payer: Self-pay

## 2021-03-05 ENCOUNTER — Other Ambulatory Visit: Payer: Medicaid Other

## 2021-03-05 ENCOUNTER — Encounter: Payer: Self-pay | Admitting: *Deleted

## 2021-03-05 ENCOUNTER — Ambulatory Visit: Payer: Medicaid Other | Attending: Obstetrics

## 2021-03-05 VITALS — BP 138/79 | HR 90

## 2021-03-05 DIAGNOSIS — O99013 Anemia complicating pregnancy, third trimester: Secondary | ICD-10-CM | POA: Diagnosis not present

## 2021-03-05 DIAGNOSIS — O10013 Pre-existing essential hypertension complicating pregnancy, third trimester: Secondary | ICD-10-CM | POA: Diagnosis not present

## 2021-03-05 DIAGNOSIS — F319 Bipolar disorder, unspecified: Secondary | ICD-10-CM

## 2021-03-05 DIAGNOSIS — O10913 Unspecified pre-existing hypertension complicating pregnancy, third trimester: Secondary | ICD-10-CM | POA: Insufficient documentation

## 2021-03-05 DIAGNOSIS — Z3A31 31 weeks gestation of pregnancy: Secondary | ICD-10-CM

## 2021-03-05 DIAGNOSIS — M329 Systemic lupus erythematosus, unspecified: Secondary | ICD-10-CM | POA: Diagnosis not present

## 2021-03-05 DIAGNOSIS — D649 Anemia, unspecified: Secondary | ICD-10-CM | POA: Diagnosis not present

## 2021-03-05 DIAGNOSIS — O99323 Drug use complicating pregnancy, third trimester: Secondary | ICD-10-CM

## 2021-03-05 DIAGNOSIS — O365930 Maternal care for other known or suspected poor fetal growth, third trimester, not applicable or unspecified: Secondary | ICD-10-CM

## 2021-03-05 DIAGNOSIS — O99113 Other diseases of the blood and blood-forming organs and certain disorders involving the immune mechanism complicating pregnancy, third trimester: Secondary | ICD-10-CM

## 2021-03-05 DIAGNOSIS — O09213 Supervision of pregnancy with history of pre-term labor, third trimester: Secondary | ICD-10-CM

## 2021-03-05 DIAGNOSIS — O365990 Maternal care for other known or suspected poor fetal growth, unspecified trimester, not applicable or unspecified: Secondary | ICD-10-CM | POA: Insufficient documentation

## 2021-03-05 DIAGNOSIS — O99333 Smoking (tobacco) complicating pregnancy, third trimester: Secondary | ICD-10-CM

## 2021-03-05 DIAGNOSIS — F1920 Other psychoactive substance dependence, uncomplicated: Secondary | ICD-10-CM

## 2021-03-05 DIAGNOSIS — Z362 Encounter for other antenatal screening follow-up: Secondary | ICD-10-CM

## 2021-03-05 NOTE — Progress Notes (Signed)
C/o" headache and blurred vision last night."

## 2021-03-10 ENCOUNTER — Encounter: Payer: Medicaid Other | Admitting: Family Medicine

## 2021-03-11 ENCOUNTER — Telehealth: Payer: Self-pay

## 2021-03-11 ENCOUNTER — Encounter: Payer: Self-pay | Admitting: *Deleted

## 2021-03-11 ENCOUNTER — Ambulatory Visit: Payer: Medicaid Other | Attending: Obstetrics

## 2021-03-11 ENCOUNTER — Inpatient Hospital Stay (HOSPITAL_COMMUNITY)
Admission: AD | Admit: 2021-03-11 | Discharge: 2021-03-11 | Disposition: A | Discharge status: Home / Self Care | Payer: Medicaid Other | Attending: Obstetrics and Gynecology | Admitting: Obstetrics and Gynecology

## 2021-03-11 ENCOUNTER — Encounter (HOSPITAL_COMMUNITY): Payer: Self-pay | Admitting: Obstetrics and Gynecology

## 2021-03-11 ENCOUNTER — Ambulatory Visit: Payer: Medicaid Other | Admitting: *Deleted

## 2021-03-11 ENCOUNTER — Ambulatory Visit (HOSPITAL_BASED_OUTPATIENT_CLINIC_OR_DEPARTMENT_OTHER): Payer: Medicaid Other | Admitting: *Deleted

## 2021-03-11 ENCOUNTER — Other Ambulatory Visit: Payer: Self-pay

## 2021-03-11 VITALS — BP 143/92 | HR 89

## 2021-03-11 DIAGNOSIS — O10013 Pre-existing essential hypertension complicating pregnancy, third trimester: Secondary | ICD-10-CM | POA: Diagnosis not present

## 2021-03-11 DIAGNOSIS — O99343 Other mental disorders complicating pregnancy, third trimester: Secondary | ICD-10-CM

## 2021-03-11 DIAGNOSIS — O99333 Smoking (tobacco) complicating pregnancy, third trimester: Secondary | ICD-10-CM | POA: Insufficient documentation

## 2021-03-11 DIAGNOSIS — F1920 Other psychoactive substance dependence, uncomplicated: Secondary | ICD-10-CM

## 2021-03-11 DIAGNOSIS — Z7952 Long term (current) use of systemic steroids: Secondary | ICD-10-CM | POA: Insufficient documentation

## 2021-03-11 DIAGNOSIS — F1290 Cannabis use, unspecified, uncomplicated: Secondary | ICD-10-CM | POA: Diagnosis not present

## 2021-03-11 DIAGNOSIS — O365990 Maternal care for other known or suspected poor fetal growth, unspecified trimester, not applicable or unspecified: Secondary | ICD-10-CM

## 2021-03-11 DIAGNOSIS — D649 Anemia, unspecified: Secondary | ICD-10-CM

## 2021-03-11 DIAGNOSIS — F319 Bipolar disorder, unspecified: Secondary | ICD-10-CM | POA: Diagnosis not present

## 2021-03-11 DIAGNOSIS — O99323 Drug use complicating pregnancy, third trimester: Secondary | ICD-10-CM | POA: Diagnosis not present

## 2021-03-11 DIAGNOSIS — Z79899 Other long term (current) drug therapy: Secondary | ICD-10-CM | POA: Diagnosis not present

## 2021-03-11 DIAGNOSIS — M329 Systemic lupus erythematosus, unspecified: Secondary | ICD-10-CM | POA: Diagnosis not present

## 2021-03-11 DIAGNOSIS — O169 Unspecified maternal hypertension, unspecified trimester: Secondary | ICD-10-CM

## 2021-03-11 DIAGNOSIS — O10913 Unspecified pre-existing hypertension complicating pregnancy, third trimester: Secondary | ICD-10-CM | POA: Diagnosis not present

## 2021-03-11 DIAGNOSIS — O99113 Other diseases of the blood and blood-forming organs and certain disorders involving the immune mechanism complicating pregnancy, third trimester: Secondary | ICD-10-CM | POA: Insufficient documentation

## 2021-03-11 DIAGNOSIS — Z3A32 32 weeks gestation of pregnancy: Secondary | ICD-10-CM | POA: Insufficient documentation

## 2021-03-11 DIAGNOSIS — F17210 Nicotine dependence, cigarettes, uncomplicated: Secondary | ICD-10-CM

## 2021-03-11 DIAGNOSIS — O09213 Supervision of pregnancy with history of pre-term labor, third trimester: Secondary | ICD-10-CM

## 2021-03-11 DIAGNOSIS — Z362 Encounter for other antenatal screening follow-up: Secondary | ICD-10-CM | POA: Diagnosis not present

## 2021-03-11 DIAGNOSIS — O26893 Other specified pregnancy related conditions, third trimester: Secondary | ICD-10-CM | POA: Diagnosis not present

## 2021-03-11 DIAGNOSIS — O288 Other abnormal findings on antenatal screening of mother: Secondary | ICD-10-CM

## 2021-03-11 DIAGNOSIS — M797 Fibromyalgia: Secondary | ICD-10-CM | POA: Insufficient documentation

## 2021-03-11 DIAGNOSIS — O368130 Decreased fetal movements, third trimester, not applicable or unspecified: Secondary | ICD-10-CM | POA: Diagnosis not present

## 2021-03-11 DIAGNOSIS — O99013 Anemia complicating pregnancy, third trimester: Secondary | ICD-10-CM

## 2021-03-11 DIAGNOSIS — O99891 Other specified diseases and conditions complicating pregnancy: Secondary | ICD-10-CM | POA: Diagnosis not present

## 2021-03-11 DIAGNOSIS — O09293 Supervision of pregnancy with other poor reproductive or obstetric history, third trimester: Secondary | ICD-10-CM

## 2021-03-11 DIAGNOSIS — O365930 Maternal care for other known or suspected poor fetal growth, third trimester, not applicable or unspecified: Secondary | ICD-10-CM

## 2021-03-11 DIAGNOSIS — O34219 Maternal care for unspecified type scar from previous cesarean delivery: Secondary | ICD-10-CM

## 2021-03-11 LAB — COMPREHENSIVE METABOLIC PANEL
ALT: 26 U/L (ref 0–44)
AST: 26 U/L (ref 15–41)
Albumin: 3.1 g/dL — ABNORMAL LOW (ref 3.5–5.0)
Alkaline Phosphatase: 123 U/L (ref 38–126)
Anion gap: 9 (ref 5–15)
BUN: 12 mg/dL (ref 6–20)
CO2: 21 mmol/L — ABNORMAL LOW (ref 22–32)
Calcium: 8.9 mg/dL (ref 8.9–10.3)
Chloride: 102 mmol/L (ref 98–111)
Creatinine, Ser: 0.63 mg/dL (ref 0.44–1.00)
GFR, Estimated: >60 mL/min (ref >60)
Glucose, Bld: 115 mg/dL — ABNORMAL HIGH (ref 70–99)
Potassium: 3.6 mmol/L (ref 3.5–5.1)
Sodium: 132 mmol/L — ABNORMAL LOW (ref 135–145)
Total Bilirubin: 0.3 mg/dL (ref 0.3–1.2)
Total Protein: 6.2 g/dL — ABNORMAL LOW (ref 6.5–8.1)

## 2021-03-11 LAB — CBC
HCT: 36.3 % (ref 36.0–46.0)
Hemoglobin: 11.7 g/dL — ABNORMAL LOW (ref 12.0–15.0)
MCH: 29.3 pg (ref 26.0–34.0)
MCHC: 32.2 g/dL (ref 30.0–36.0)
MCV: 91 fL (ref 80.0–100.0)
Platelets: 250 10*3/uL (ref 150–400)
RBC: 3.99 MIL/uL (ref 3.87–5.11)
RDW: 13.8 % (ref 11.5–15.5)
WBC: 5.5 10*3/uL (ref 4.0–10.5)
nRBC: 0 % (ref 0.0–0.2)

## 2021-03-11 LAB — PROTEIN / CREATININE RATIO, URINE
Creatinine, Urine: 242.75 mg/dL
Protein Creatinine Ratio: 0.11 mg/mg{Cre} (ref 0.00–0.15)
Total Protein, Urine: 27 mg/dL

## 2021-03-11 MED ORDER — BETAMETHASONE SOD PHOS & ACET 6 (3-3) MG/ML IJ SUSP
12.0000 mg | Freq: Once | INTRAMUSCULAR | Status: AC
  Administered 2021-03-11: 12 mg via INTRAMUSCULAR
  Filled 2021-03-11: qty 5

## 2021-03-11 MED ORDER — LACTATED RINGERS IV SOLN: Freq: Once | INTRAVENOUS | Status: AC

## 2021-03-11 NOTE — Telephone Encounter (Signed)
Mar/patient called to advise she may be a little late-verified w/Sonographers, okay to check her in upon arrival-03/11/2021@158p 

## 2021-03-11 NOTE — MAU Note (Addendum)
Just left appt, baby was having decels on NST.  Pt hadn't eaten, so was told to eat and then come to the hospital.  Denies pain, bleeding or leaking.  Has CHTN, did taken her medicine about an hour ago. Reports normal movement today. Denies HA, visual changes, 'lingering pain in RUQ", reports "a lot of swelling in feet", though better today.

## 2021-03-11 NOTE — Procedures (Signed)
Wendy Herman 05-07-1986 [redacted]w[redacted]d  Fetus A Non-Stress Test Interpretation for 03/11/21  Indication:  FGR, Abnormal Dopplers  Fetal Heart Rate A Mode: External Baseline Rate (A): 140 bpm Variability: Minimal Accelerations: None Decelerations: Variable Multiple birth?: No  Uterine Activity Mode: Palpation, Toco Contraction Frequency (min): occ Contraction Duration (sec): 50-60 Contraction Quality: Mild Resting Tone Palpated: Relaxed Resting Time: Adequate  Interpretation (Fetal Testing) Nonstress Test Interpretation: Non-reactive Overall Impression: Non-reassuring Comments: Dr. Grace Bushy reviewed tracing. Patient sent to [facility] MAU for further monitoring.

## 2021-03-11 NOTE — MAU Provider Note (Addendum)
History     CSN: [id number]  Arrival date and time: 03/11/21 1835   Event Date/Time   First Provider Initiated Contact with Patient 03/11/21 1924      Chief Complaint  Patient presents with   Hypertension   HPI  Wendy Herman [id number]  @ [redacted]w[redacted]d  with a history of IUGR, and chronic hypertension, here in MAU for an NST. She was seen in MFM today and had a 8/10 BPP; non reactive NST, and elevated dopplers. She was sent here for further monitoring.  She missed one dose of labetalol today and yesterday. Last dose of labetalol was today at 1800; 400 mg PO.   Feels like her fetal movements are decreased today. Last suboxone was this morning.    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 Died from it.   Depression Father    Rheum arthritis Maternal Grandmother    Lupus Maternal Grandmother    Diabetes Maternal Grandmother    Heart disease Maternal Grandfather    Diabetes Maternal Grandfather    Breast cancer Maternal Aunt    Pancreatic cancer Maternal Uncle    Diabetes Maternal Aunt    Diabetes Maternal Aunt     Social History   Tobacco Use   Smoking status: Every Day    Packs/day: 0.25     Years: 15.00    Pack years: 3.75    Types: Cigarettes   Smokeless tobacco: Never  Vaping Use   Vaping Use: Never used  Substance Use Topics   Alcohol use: No   Drug use: Yes    Types: Marijuana    Comment: takes Suboxone- but not daily    Allergies:  Allergies  Allergen Reactions   Sulfa Antibiotics Swelling    Mouth/facial swelling   Codeine Swelling    "not actually allergic, just doesn't want to take it   Nsaids Other (See Comments)    Interacts with GERD    Medications Prior to Admission  Medication Sig Dispense Refill Last Dose   Buprenorphine HCl-Naloxone HCl (SUBOXONE) 2-0.5 MG FILM Place 3 Film under the tongue daily for 14 days. In divided doses: 2 in AM and 1 in pm 42 each 0  cloNIDine (CATAPRES) 0.2 MG tablet Take 1 tablet (0.2 mg total) by mouth at bedtime. 30 tablet 5    hydroxychloroquine (PLAQUENIL) 200 MG tablet Take 2 tablets (400 mg total) by mouth daily. 60 tablet 5    labetalol (NORMODYNE) 200 MG tablet Take 2 tablets (400 mg total) by mouth 2 (two) times daily. 120 tablet 3    lamoTRIgine (LAMICTAL) 200 MG tablet Take 1 tablet (200 mg total) by mouth daily. 30 tablet 5    lidocaine (XYLOCAINE) 2 % solution Use as directed 15 mLs in the mouth or throat as needed for mouth pain. (Patient not taking: No sig reported) 15 mL 0    metoCLOPramide (REGLAN) 10 MG tablet Take 1 tablet (10 mg total) by mouth 4 (four) times daily as needed for nausea or vomiting. 120 tablet 2    OLANZapine (ZYPREXA) 20 MG tablet Take 20 mg by mouth at bedtime. (Patient not taking: No sig reported)      omeprazole (PRILOSEC) 20 MG capsule TAKE 2 CAPSULES BY MOUTH TWICE DAILY AS NEEDED FOR ACID REFLUX. 120 capsule 3    ondansetron (ZOFRAN ODT) 4 MG disintegrating tablet Take 1 tablet (4 mg total) by mouth every 6 (six) hours. 120 tablet 3    polyethylene glycol powder (GLYCOLAX/MIRALAX) 17 GM/SCOOP powder TAKE 17 G BY MOUTH 2 (TWO) TIMES DAILY AS NEEDED. 510 g 1    predniSONE (DELTASONE)  20 MG tablet Take 1 tablet (20 mg total) by mouth daily. 90 tablet 3    Prenatal Vit-Fe Fumarate-FA (PREPLUS) 27-1 MG TABS Take 1 tablet by mouth daily. 30 tablet 13    promethazine (PHENERGAN) 25 MG tablet Take 1 tablet (25 mg total) by mouth every 6 (six) hours as needed for nausea or vomiting. 60 tablet 3    terconazole (TERAZOL 7) 0.4 % vaginal cream INSERT 1 APPLICATORFUL VAGINALLY AT BEDTIME FOR 3 DAYS (Patient not taking: Reported on 03/05/2021) 45 g 0    No results found for this or any previous visit (from the past 48 hour(s)).   Review of Systems  Eyes:  Negative for photophobia and visual disturbance.  Gastrointestinal:  Negative for abdominal pain.  Genitourinary:  Negative for vaginal bleeding and vaginal discharge.  Neurological:  Negative for headaches.  Physical Exam   Blood pressure (!) 139/95, pulse 89, temperature 98.6 F (37 C), temperature source Oral, resp. rate 18, height 5\' 4"  (1.626 m), weight 72 kg, last menstrual period 07/29/2020, SpO2 99 %.   Patient Vitals for the past 24 hrs:  BP Temp Temp src Pulse Resp SpO2 Height Weight  03/11/21 1856 (!) 139/95 98.6 F (37 C) Oral 89 18 99 % 5\' 4"  (1.626 m) 72 kg    Physical Exam Constitutional:      General: She is not in acute distress.    Appearance: She is not ill-appearing, toxic-appearing or diaphoretic.  HENT:     Head: Normocephalic.  Eyes:     Pupils: Pupils are equal, round, and reactive to light.  Pulmonary:     Effort: Pulmonary effort is normal.  Musculoskeletal:        General: Normal range of motion.  Skin:    General: Skin is warm.  Neurological:     Mental Status: She is alert and oriented to person, place, and time.     Deep Tendon Reflexes: Reflexes normal.     Comments: Negative clonus   Psychiatric:        Behavior: Behavior normal.   Fetal  Tracing:  Baseline: 140 bpm Variability: Minimal  Accelerations: 10x10 Decelerations: None Toco: None   MAU Course   Procedures None  MDM  PIH labs collected,  Reviewed tracing and BPP, dopplers, BP's with Dr. Alysia Penna. Will fluid bolus her and collect PIH labs.  Report given to Wynelle Bourgeois CNM, who resumes care of the patient.   Results for orders placed or performed during the hospital encounter of 03/11/21 (from the past 24 hour(s))  CBC     Status: Abnormal   Collection Time: 03/11/21  7:24 PM  Result Value Ref Range   WBC 5.5 4.0 - 10.5 K/uL   RBC 3.99 3.87 - 5.11 MIL/uL   Hemoglobin 11.7 (L) 12.0 - 15.0 g/dL   HCT 99.3 71.6 - 96.7 %   MCV 91.0 80.0 - 100.0 fL   MCH 29.3 26.0 - 34.0 pg   MCHC 32.2 30.0 - 36.0 g/dL   RDW 89.3 81.0 - 17.5 %   Platelets 250 150 - 400 K/uL   nRBC 0.0 0.0 - 0.2 %  Comprehensive metabolic panel     Status: Abnormal   Collection Time: 03/11/21  7:24 PM  Result Value Ref Range   Sodium 132 (L) 135 - 145 mmol/L   Potassium 3.6 3.5 - 5.1 mmol/L   Chloride 102 98 - 111 mmol/L   CO2 21 (L) 22 - 32 mmol/L   Glucose, Bld 115 (H) 70 - 99 mg/dL   BUN 12 6 - 20 mg/dL   Creatinine, Ser 1.02 0.44 - 1.00 mg/dL   Calcium 8.9 8.9 - 58.5 mg/dL   Total Protein 6.2 (L) 6.5 - 8.1 g/dL   Albumin 3.1 (L) 3.5 - 5.0 g/dL   AST 26 15 - 41 U/L   ALT 26 0 - 44 U/L   Alkaline Phosphatase 123 38 - 126 U/L   Total Bilirubin 0.3 0.3 - 1.2 mg/dL   GFR, Estimated >27 >78 mL/min   Anion gap 9 5 - 15  Protein / creatinine ratio, urine     Status: None   Collection Time: 03/11/21  7:42 PM  Result Value Ref Range   Creatinine, Urine 242.75 mg/dL   Total Protein, Urine 27 mg/dL   Protein Creatinine Ratio 0.11 0.00 - 0.15 mg/mg[Cre]   Reviewed results with patient  Assessment and Plan  A:  Single IUP at [redacted]w[redacted]d        BPP8/10, nonreactive NST        Now has reactive NST         Chronic hypertension, missed dose of Labetalol         Normal PIH labs  P:   Discharge home per Dr Alysia Penna        Preeclampsia precautions        Fetal kick counts        Followup in office for  monitoring   Encouraged to return if she develops worsening of symptoms, increase in pain, fever, or other concerning symptoms.   Aviva Signs, CNM

## 2021-03-12 ENCOUNTER — Other Ambulatory Visit: Payer: Self-pay | Admitting: *Deleted

## 2021-03-12 ENCOUNTER — Inpatient Hospital Stay (HOSPITAL_COMMUNITY)
Admission: AD | Admit: 2021-03-12 | Discharge: 2021-03-13 | Disposition: A | Discharge status: Home / Self Care | Payer: Medicaid Other | Attending: Obstetrics and Gynecology | Admitting: Obstetrics and Gynecology

## 2021-03-12 DIAGNOSIS — O10013 Pre-existing essential hypertension complicating pregnancy, third trimester: Secondary | ICD-10-CM

## 2021-03-12 DIAGNOSIS — Z3689 Encounter for other specified antenatal screening: Secondary | ICD-10-CM | POA: Diagnosis not present

## 2021-03-12 DIAGNOSIS — Z3A32 32 weeks gestation of pregnancy: Secondary | ICD-10-CM | POA: Insufficient documentation

## 2021-03-12 DIAGNOSIS — O10913 Unspecified pre-existing hypertension complicating pregnancy, third trimester: Secondary | ICD-10-CM

## 2021-03-12 DIAGNOSIS — O368120 Decreased fetal movements, second trimester, not applicable or unspecified: Secondary | ICD-10-CM | POA: Insufficient documentation

## 2021-03-12 DIAGNOSIS — O365930 Maternal care for other known or suspected poor fetal growth, third trimester, not applicable or unspecified: Secondary | ICD-10-CM

## 2021-03-12 DIAGNOSIS — O368130 Decreased fetal movements, third trimester, not applicable or unspecified: Secondary | ICD-10-CM | POA: Diagnosis not present

## 2021-03-12 MED ORDER — BETAMETHASONE SOD PHOS & ACET 6 (3-3) MG/ML IJ SUSP
12.0000 mg | Freq: Once | INTRAMUSCULAR | Status: AC
  Administered 2021-03-12: 12 mg via INTRAMUSCULAR
  Filled 2021-03-12: qty 5

## 2021-03-12 NOTE — MAU Provider Note (Addendum)
Event Date/Time   First Provider Initiated Contact with Patient 03/12/21 2130   S Ms. Wendy Herman is a 35 y.o. G14P0201 pregnant female who presents to MAU today for repeat betamethasone dose. She was seen in MAU last night for monitoring after a BPP of 8/10 - given BMZ for IUGR with cHTN and possible need for preterm delivery. When I went in to medically screen, pt noted she has not felt baby move much at all today, except for just after she got to the MAU waiting room. No other physical complaints including vaginal bleeding, cramping or contractions.  Receives care at [facility] (red chart pt), prenatal records reviewed.  Pertinent items noted in HPI and remainder of comprehensive ROS otherwise negative.   O BP 139/82 (BP Location: Left Arm)   Pulse 80   Resp 16   LMP 07/29/2020   SpO2 100%  Physical Exam Vitals and nursing note reviewed.  Constitutional:      General: She is not in acute distress.    Appearance: Normal appearance. She is not ill-appearing.  HENT:     Head: Normocephalic and atraumatic.     Mouth/Throat:     Mouth: Mucous membranes are moist.  Eyes:     Pupils: Pupils are equal, round, and reactive to light.  Cardiovascular:     Rate and Rhythm: Normal rate.  Pulmonary:     Effort: Pulmonary effort is normal.  Musculoskeletal:        General: Normal range of motion.     Cervical back: Normal range of motion.  Skin:    General: Skin is warm.     Capillary Refill: Capillary refill takes less than 2 seconds.  Neurological:     Mental Status: She is alert and oriented to person, place, and time.  Psychiatric:        Mood and Affect: Mood normal.        Behavior: Behavior normal.        Thought Content: Thought content normal.        Judgment: Judgment normal.   Pt placed on monitors for NST, care turned over to [facility], CNM at 2200. Wendy Herman, CNM, MSN, IBCLC     [address] [place], [place] 03/12/2021 10:06 PM  Fetal Tracing:   Baseline:  135 Variability: Moderate Accelerations: Present 10x10 Decelerations: Occasional Variable Toco: UI  A Decreased Fetal Movement NST Reactive  P Discharge from MAU in stable condition Warning signs for worsening condition that would warrant emergency follow-up discussed Patient may return to MAU as needed for emergent OB/GYN related complaints  Wendy Robins MSN, CNM Advanced Practice Provider, Center for [facility]

## 2021-03-12 NOTE — Progress Notes (Signed)
Pt reports decreased FM today, denies Ctx, LOF, and VB.

## 2021-03-12 NOTE — Progress Notes (Signed)
Pt here for second dose of BMZ, given yesterday 03/11/2021 @2011  for potential delivery.

## 2021-03-15 ENCOUNTER — Other Ambulatory Visit: Payer: Self-pay | Admitting: Obstetrics

## 2021-03-16 ENCOUNTER — Other Ambulatory Visit: Payer: Self-pay | Admitting: Obstetrics

## 2021-03-16 DIAGNOSIS — O365990 Maternal care for other known or suspected poor fetal growth, unspecified trimester, not applicable or unspecified: Secondary | ICD-10-CM

## 2021-03-17 ENCOUNTER — Other Ambulatory Visit: Payer: Self-pay | Admitting: Obstetrics and Gynecology

## 2021-03-17 ENCOUNTER — Other Ambulatory Visit: Payer: Self-pay

## 2021-03-17 ENCOUNTER — Telehealth: Payer: Self-pay | Admitting: *Deleted

## 2021-03-17 ENCOUNTER — Encounter: Payer: Self-pay | Admitting: *Deleted

## 2021-03-17 ENCOUNTER — Ambulatory Visit (INDEPENDENT_AMBULATORY_CARE_PROVIDER_SITE_OTHER): Payer: Medicaid Other | Admitting: Family Medicine

## 2021-03-17 VITALS — BP 135/87 | HR 91 | Wt 160.0 lb

## 2021-03-17 DIAGNOSIS — M329 Systemic lupus erythematosus, unspecified: Secondary | ICD-10-CM

## 2021-03-17 DIAGNOSIS — F3112 Bipolar disorder, current episode manic without psychotic features, moderate: Secondary | ICD-10-CM

## 2021-03-17 DIAGNOSIS — O169 Unspecified maternal hypertension, unspecified trimester: Secondary | ICD-10-CM

## 2021-03-17 DIAGNOSIS — Z3A33 33 weeks gestation of pregnancy: Secondary | ICD-10-CM | POA: Diagnosis not present

## 2021-03-17 DIAGNOSIS — O365920 Maternal care for other known or suspected poor fetal growth, second trimester, not applicable or unspecified: Secondary | ICD-10-CM

## 2021-03-17 DIAGNOSIS — O0993 Supervision of high risk pregnancy, unspecified, third trimester: Secondary | ICD-10-CM

## 2021-03-17 DIAGNOSIS — O99891 Other specified diseases and conditions complicating pregnancy: Secondary | ICD-10-CM

## 2021-03-17 DIAGNOSIS — O99320 Drug use complicating pregnancy, unspecified trimester: Secondary | ICD-10-CM

## 2021-03-17 DIAGNOSIS — F1120 Opioid dependence, uncomplicated: Secondary | ICD-10-CM

## 2021-03-17 DIAGNOSIS — O09899 Supervision of other high risk pregnancies, unspecified trimester: Secondary | ICD-10-CM

## 2021-03-17 MED ORDER — BUPRENORPHINE HCL-NALOXONE HCL 2-0.5 MG SL FILM: 2.0000 | ORAL_FILM | Freq: Two times a day (BID) | SUBLINGUAL | 0 refills | Status: DC

## 2021-03-17 MED ORDER — ACCU-CHEK GUIDE VI STRP: ORAL_STRIP | 12 refills | Status: DC

## 2021-03-17 MED ORDER — ACCU-CHEK GUIDE W/DEVICE KIT: PACK | 0 refills | Status: DC

## 2021-03-17 MED ORDER — ACCU-CHEK SOFTCLIX LANCETS MISC
12 refills | Status: DC
Start: 2021-03-17 — End: 2021-04-17

## 2021-03-17 NOTE — Telephone Encounter (Signed)
Call to patient. Advised c-section date has been moved up to 04-14-21 at 0930. Arrive 0730.  Encounter closed.

## 2021-03-17 NOTE — Progress Notes (Signed)
PRENATAL VISIT NOTE  Subjective:  Wendy Herman is a 35 y.o. G3P0201 at [redacted]w[redacted]d being seen today for ongoing prenatal care.  She is currently monitored for the following issues for this high-risk pregnancy and has Chest pain; Neutropenia (HCC); Anemia; SLE (systemic lupus erythematosus) (HCC); Discoid lupus erythematosus; Bipolar affective disorder, current episode hypomanic (HCC); Vitamin D insufficiency; Bipolar 1 disorder with moderate mania (HCC); Supervision of high-risk pregnancy; History of preterm delivery, currently pregnant; Tobacco smoking affecting pregnancy, antepartum; Hypertension affecting pregnancy, antepartum; Lupus anticoagulant affecting pregnancy, antepartum (HCC); Suboxone maintenance treatment complicating pregnancy, antepartum, unspecified trimester (HCC); ASCUS with positive high risk HPV cervical; Systemic lupus erythematosus affecting pregnancy (HCC); IUGR (intrauterine growth restriction) affecting care of mother, second trimester; and Vaginal bleeding in pregnancy on their problem list.  Patient reports  feels like she is having some withdrawal symptoms over night.  .  Contractions: Irritability. Vag. Bleeding: None.  Movement: Present. Denies leaking of fluid.   The following portions of the patient's history were reviewed and updated as appropriate: allergies, current medications, past family history, past medical history, past social history, past surgical history and problem list.   Objective:   Vitals:   03/17/21 0820  BP: 135/87  Pulse: 91  Weight: 160 lb (72.6 kg)    Fetal Status: Fetal Heart Rate (bpm): 134   Movement: Present     General:  Alert, oriented and cooperative. Patient is in no acute distress.  Skin: Skin is warm and dry. No rash noted.   Cardiovascular: Normal heart rate noted  Respiratory: Normal respiratory effort, no problems with respiration noted  Abdomen: Soft, gravid, appropriate for gestational age.  Pain/Pressure: Absent      Pelvic: Cervical exam deferred        Extremities: Normal range of motion.  Edema: Trace  Mental Status: Normal mood and affect. Normal behavior. Normal judgment and thought content.   Assessment and Plan:  Pregnancy: G3P0201 at [redacted]w[redacted]d 1. [redacted] weeks gestation of pregnancy  - Glucose Tolerance, 2 Hours w/1 Hour - HIV Antibody (routine testing w rflx) - RPR  2. Supervision of high risk pregnancy in third trimester FHT normal. Phlebotomy called - Patient vomited after the glucola. Will have pt check cbgs. Meter sent to pharmacy. - Glucose Tolerance, 2 Hours w/1 Hour - HIV Antibody (routine testing w rflx) - RPR  3. IUGR (intrauterine growth restriction) affecting care of mother, second trimester Delivery at 37 weeks recommended. Twice weekly testing: BPP/NST.  4. Systemic lupus erythematosus affecting pregnancy (HCC) On prednisone 20mg  daily and plaquinel. Will need stress dose steroids in labor  5. History of preterm delivery, currently pregnant  6. Hypertension affecting pregnancy, antepartum On labetalol and clonidine  7. Suboxone maintenance treatment complicating pregnancy, antepartum, unspecified trimester (HCC) Increase dose of suboxone 4mg  BID  8. Bipolar 1 disorder with moderate mania (HCC)   Preterm labor symptoms and general obstetric precautions including but not limited to vaginal bleeding, contractions, leaking of fluid and fetal movement were reviewed in detail with the patient. Please refer to After Visit Summary for other counseling recommendations.   No follow-ups on file.  Future Appointments  Date Time Provider Department Center  03/19/2021  2:30 PM [facility] NURSE [facility]  03/19/2021  2:45 PM [facility] US4 [facility]  03/26/2021 10:45 AM [facility] DO [facility] None  03/26/2021  3:30 PM [facility] NURSE [facility]  03/26/2021  3:45 PM [facility] US4 [facility]  04/01/2021  3:30 PM [facility] 04/03/2021, DO [facility]  None  04/02/2021 12:30 PM [facility] NURSE  [facility]  04/02/2021 12:45 PM [facility] US5 [facility]  04/08/2021  2:30 PM [facility] NURSE [facility]  04/08/2021  2:45 PM [facility] US6 [facility]  04/08/2021  4:15 PM Levie Heritage, DO [facility] None  04/16/2021 11:20 AM Barbette Merino, NP [facility] None  05/19/2021  3:30 PM Levie Heritage, DO [facility] None    Levie Heritage, DO

## 2021-03-18 ENCOUNTER — Telehealth: Payer: Self-pay

## 2021-03-18 DIAGNOSIS — I7300 Raynaud's syndrome without gangrene: Secondary | ICD-10-CM

## 2021-03-18 DIAGNOSIS — O24419 Gestational diabetes mellitus in pregnancy, unspecified control: Secondary | ICD-10-CM

## 2021-03-18 LAB — GLUCOSE TOLERANCE, 2 HOURS W/ 1HR: Glucose, Fasting: 114 mg/dL — ABNORMAL HIGH (ref 65–91)

## 2021-03-18 LAB — HIV ANTIBODY (ROUTINE TESTING W REFLEX): HIV Screen 4th Generation wRfx: NONREACTIVE

## 2021-03-18 LAB — RPR: RPR Ser Ql: NONREACTIVE

## 2021-03-18 NOTE — Telephone Encounter (Signed)
Pt called requesting a Rx for a Dexcom glucose meter. Pt made aware that per her pharmacy, Dexcom glucose meter is not covered by her insurance. Understanding was voiced. Shivam Mestas l Chantille Navarrete, CMA

## 2021-03-19 ENCOUNTER — Encounter: Payer: Self-pay | Admitting: *Deleted

## 2021-03-19 ENCOUNTER — Ambulatory Visit: Payer: Medicaid Other | Admitting: *Deleted

## 2021-03-19 ENCOUNTER — Ambulatory Visit: Payer: Medicaid Other | Attending: Obstetrics

## 2021-03-19 ENCOUNTER — Other Ambulatory Visit: Payer: Self-pay

## 2021-03-19 VITALS — BP 134/73 | HR 98

## 2021-03-19 DIAGNOSIS — O10913 Unspecified pre-existing hypertension complicating pregnancy, third trimester: Secondary | ICD-10-CM | POA: Diagnosis not present

## 2021-03-19 DIAGNOSIS — O365990 Maternal care for other known or suspected poor fetal growth, unspecified trimester, not applicable or unspecified: Secondary | ICD-10-CM

## 2021-03-22 MED ORDER — DEXCOM G6 RECEIVER DEVI: 1.0000 [IU] | 0 refills | Status: DC

## 2021-03-22 MED ORDER — DEXCOM G6 TRANSMITTER MISC: 1.0000 [IU] | 3 refills | Status: DC

## 2021-03-22 MED ORDER — DEXCOM G6 SENSOR MISC: 1.0000 [IU] | 3 refills | Status: DC

## 2021-03-24 ENCOUNTER — Other Ambulatory Visit: Payer: Self-pay | Admitting: Family Medicine

## 2021-03-24 DIAGNOSIS — I7300 Raynaud's syndrome without gangrene: Secondary | ICD-10-CM

## 2021-03-24 DIAGNOSIS — O24419 Gestational diabetes mellitus in pregnancy, unspecified control: Secondary | ICD-10-CM

## 2021-03-25 ENCOUNTER — Other Ambulatory Visit: Payer: Self-pay | Admitting: Family Medicine

## 2021-03-26 ENCOUNTER — Other Ambulatory Visit: Payer: Self-pay | Admitting: Family Medicine

## 2021-03-26 ENCOUNTER — Other Ambulatory Visit: Payer: Self-pay

## 2021-03-26 ENCOUNTER — Ambulatory Visit: Payer: Medicaid Other | Attending: Obstetrics

## 2021-03-26 ENCOUNTER — Ambulatory Visit: Payer: Medicaid Other | Admitting: *Deleted

## 2021-03-26 ENCOUNTER — Encounter: Payer: Medicaid Other | Admitting: Family Medicine

## 2021-03-26 VITALS — BP 136/73

## 2021-03-26 DIAGNOSIS — O365990 Maternal care for other known or suspected poor fetal growth, unspecified trimester, not applicable or unspecified: Secondary | ICD-10-CM | POA: Insufficient documentation

## 2021-03-26 DIAGNOSIS — I7300 Raynaud's syndrome without gangrene: Secondary | ICD-10-CM

## 2021-03-26 DIAGNOSIS — O365930 Maternal care for other known or suspected poor fetal growth, third trimester, not applicable or unspecified: Secondary | ICD-10-CM | POA: Diagnosis not present

## 2021-03-26 DIAGNOSIS — O24419 Gestational diabetes mellitus in pregnancy, unspecified control: Secondary | ICD-10-CM

## 2021-03-29 ENCOUNTER — Other Ambulatory Visit: Payer: Self-pay | Admitting: Family Medicine

## 2021-03-29 ENCOUNTER — Other Ambulatory Visit: Payer: Self-pay | Admitting: *Deleted

## 2021-03-29 ENCOUNTER — Telehealth: Payer: Self-pay

## 2021-03-29 DIAGNOSIS — O365930 Maternal care for other known or suspected poor fetal growth, third trimester, not applicable or unspecified: Secondary | ICD-10-CM

## 2021-03-29 MED ORDER — BUPRENORPHINE HCL-NALOXONE HCL 2-0.5 MG SL FILM: 3.0000 | ORAL_FILM | Freq: Two times a day (BID) | SUBLINGUAL | 0 refills | Status: DC

## 2021-03-29 NOTE — Progress Notes (Signed)
Still having withdrawal symptoms. Increase suboxone to 6mg  BID.

## 2021-03-29 NOTE — Telephone Encounter (Signed)
Pt called stating she is on Suboxone and feels like she is having with drawl symptoms. She states she is having night sweats. A message will be sent to the provider.  Gloria Lambertson l Atticus Wedin, CMA

## 2021-03-30 ENCOUNTER — Other Ambulatory Visit: Payer: Self-pay | Admitting: Family Medicine

## 2021-03-30 MED ORDER — METOCLOPRAMIDE HCL 10 MG PO TABS: 10.0000 mg | ORAL_TABLET | Freq: Four times a day (QID) | ORAL | 2 refills | Status: DC | PRN

## 2021-03-30 MED ORDER — ONDANSETRON 4 MG PO TBDP: 4.0000 mg | ORAL_TABLET | Freq: Four times a day (QID) | ORAL | 3 refills | Status: DC

## 2021-03-30 MED ORDER — PREDNISONE 20 MG PO TABS: 20.0000 mg | ORAL_TABLET | Freq: Every day | ORAL | 3 refills | Status: DC

## 2021-04-01 ENCOUNTER — Other Ambulatory Visit (HOSPITAL_COMMUNITY)
Admission: RE | Admit: 2021-04-01 | Discharge: 2021-04-01 | Disposition: A | Discharge status: Home / Self Care | Payer: Medicaid Other | Source: Ambulatory Visit | Attending: Family Medicine | Admitting: Family Medicine

## 2021-04-01 ENCOUNTER — Ambulatory Visit (INDEPENDENT_AMBULATORY_CARE_PROVIDER_SITE_OTHER): Payer: Medicaid Other | Admitting: Family Medicine

## 2021-04-01 ENCOUNTER — Other Ambulatory Visit: Payer: Self-pay

## 2021-04-01 VITALS — BP 137/84 | HR 100 | Wt 160.0 lb

## 2021-04-01 DIAGNOSIS — O09899 Supervision of other high risk pregnancies, unspecified trimester: Secondary | ICD-10-CM

## 2021-04-01 DIAGNOSIS — R87810 Cervical high risk human papillomavirus (HPV) DNA test positive: Secondary | ICD-10-CM

## 2021-04-01 DIAGNOSIS — Z3A35 35 weeks gestation of pregnancy: Secondary | ICD-10-CM | POA: Diagnosis not present

## 2021-04-01 DIAGNOSIS — O365920 Maternal care for other known or suspected poor fetal growth, second trimester, not applicable or unspecified: Secondary | ICD-10-CM

## 2021-04-01 DIAGNOSIS — O0993 Supervision of high risk pregnancy, unspecified, third trimester: Secondary | ICD-10-CM | POA: Diagnosis not present

## 2021-04-01 DIAGNOSIS — F1120 Opioid dependence, uncomplicated: Secondary | ICD-10-CM

## 2021-04-01 DIAGNOSIS — O24419 Gestational diabetes mellitus in pregnancy, unspecified control: Secondary | ICD-10-CM | POA: Insufficient documentation

## 2021-04-01 DIAGNOSIS — M329 Systemic lupus erythematosus, unspecified: Secondary | ICD-10-CM

## 2021-04-01 DIAGNOSIS — F3112 Bipolar disorder, current episode manic without psychotic features, moderate: Secondary | ICD-10-CM

## 2021-04-01 DIAGNOSIS — O99320 Drug use complicating pregnancy, unspecified trimester: Secondary | ICD-10-CM

## 2021-04-01 DIAGNOSIS — O99891 Other specified diseases and conditions complicating pregnancy: Secondary | ICD-10-CM

## 2021-04-01 DIAGNOSIS — O169 Unspecified maternal hypertension, unspecified trimester: Secondary | ICD-10-CM

## 2021-04-01 DIAGNOSIS — R87610 Atypical squamous cells of undetermined significance on cytologic smear of cervix (ASC-US): Secondary | ICD-10-CM

## 2021-04-01 NOTE — Progress Notes (Signed)
+   Fetal movement. Pt is starting to have increased pressure and irregular contractions.

## 2021-04-01 NOTE — Progress Notes (Signed)
PRENATAL VISIT NOTE  Subjective:  Wendy Herman is a 35 y.o. G3P0201 at [redacted]w[redacted]d being seen today for ongoing prenatal care.  She is currently monitored for the following issues for this high-risk pregnancy and has Chest pain; Neutropenia (HCC); Anemia; SLE (systemic lupus erythematosus) (HCC); Discoid lupus erythematosus; Bipolar affective disorder, current episode hypomanic (HCC); Vitamin D insufficiency; Bipolar 1 disorder with moderate mania (HCC); Supervision of high-risk pregnancy; History of preterm delivery, currently pregnant; Tobacco smoking affecting pregnancy, antepartum; Hypertension affecting pregnancy, antepartum; Lupus anticoagulant affecting pregnancy, antepartum (HCC); Suboxone maintenance treatment complicating pregnancy, antepartum, unspecified trimester (HCC); ASCUS with positive high risk HPV cervical; Systemic lupus erythematosus affecting pregnancy (HCC); IUGR (intrauterine growth restriction) affecting care of mother, second trimester; and Vaginal bleeding in pregnancy on their problem list.  Patient reports  a lot of joint pain, ulceration on back of head c/w lupus flare .  Contractions: Irritability. Vag. Bleeding: None.  Movement: Present. Denies leaking of fluid.   The following portions of the patient's history were reviewed and updated as appropriate: allergies, current medications, past family history, past medical history, past social history, past surgical history and problem list.   Objective:   Vitals:   04/01/21 1542  BP: 137/84  Pulse: 100  Weight: 160 lb (72.6 kg)    Fetal Status: Fetal Heart Rate (bpm): 143   Movement: Present     General:  Alert, oriented and cooperative. Patient is in no acute distress.  Skin: Skin is warm and dry. No rash noted.   Cardiovascular: Normal heart rate noted  Respiratory: Normal respiratory effort, no problems with respiration noted  Abdomen: Soft, gravid, appropriate for gestational age.  Pain/Pressure: Present      Pelvic: Cervical exam deferred        Extremities: Normal range of motion.  Edema: Trace  Mental Status: Normal mood and affect. Normal behavior. Normal judgment and thought content.   Assessment and Plan:  Pregnancy: G3P0201 at [redacted]w[redacted]d 1. [redacted] weeks gestation of pregnancy - Strep Gp B NAA - Cervicovaginal ancillary only( [place])  2. Supervision of high risk pregnancy in third trimester FHT and FH normal - Strep Gp B NAA - Cervicovaginal ancillary only( [place])  3. Systemic lupus erythematosus affecting pregnancy (HCC) Steroid burst over next 6 days: 40mg  x 3 days, then 30mg  x 3 days, then back to 20mg . Will need stress dose steroids during cesarean delivery  4. IUGR (intrauterine growth restriction) affecting care of mother, second trimester Delivery at 37 weeks  5. ASCUS with positive high risk HPV cervical  6. History of preterm delivery, currently pregnant  7. Hypertension affecting pregnancy, antepartum Bp controlled.  8. Suboxone maintenance treatment complicating pregnancy, antepartum, unspecified trimester (HCC) At 6mg  BID.  Stable  9. GDM Controlled for the most part - occasional high CBG.   Preterm labor symptoms and general obstetric precautions including but not limited to vaginal bleeding, contractions, leaking of fluid and fetal movement were reviewed in detail with the patient. Please refer to After Visit Summary for other counseling recommendations.   No follow-ups on file.  Future Appointments  Date Time Provider Department Center  04/02/2021 12:30 PM [facility] NURSE [facility]  04/02/2021 12:45 PM [facility] US5 [facility]  04/07/2021  2:45 PM [facility] GDM CLASS [facility] NDM  04/08/2021  4:15 PM 06/03/2021, DO [facility] None  04/16/2021 11:20 AM 06/08/2021, NP [facility] None  05/19/2021  3:30 PM Levie Heritage, DO [facility] None    04/18/2021  Joyice Faster, DO

## 2021-04-02 ENCOUNTER — Other Ambulatory Visit: Payer: Self-pay | Admitting: Maternal & Fetal Medicine

## 2021-04-02 ENCOUNTER — Encounter: Payer: Self-pay | Admitting: *Deleted

## 2021-04-02 ENCOUNTER — Ambulatory Visit: Payer: Medicaid Other | Admitting: *Deleted

## 2021-04-02 ENCOUNTER — Ambulatory Visit: Payer: Medicaid Other | Attending: Obstetrics and Gynecology

## 2021-04-02 VITALS — BP 148/83 | HR 93

## 2021-04-02 DIAGNOSIS — O99343 Other mental disorders complicating pregnancy, third trimester: Secondary | ICD-10-CM

## 2021-04-02 DIAGNOSIS — Z3A35 35 weeks gestation of pregnancy: Secondary | ICD-10-CM

## 2021-04-02 DIAGNOSIS — O99333 Smoking (tobacco) complicating pregnancy, third trimester: Secondary | ICD-10-CM | POA: Diagnosis not present

## 2021-04-02 DIAGNOSIS — O99323 Drug use complicating pregnancy, third trimester: Secondary | ICD-10-CM | POA: Diagnosis not present

## 2021-04-02 DIAGNOSIS — F17210 Nicotine dependence, cigarettes, uncomplicated: Secondary | ICD-10-CM

## 2021-04-02 DIAGNOSIS — O99891 Other specified diseases and conditions complicating pregnancy: Secondary | ICD-10-CM

## 2021-04-02 DIAGNOSIS — M329 Systemic lupus erythematosus, unspecified: Secondary | ICD-10-CM

## 2021-04-02 DIAGNOSIS — O99320 Drug use complicating pregnancy, unspecified trimester: Secondary | ICD-10-CM | POA: Insufficient documentation

## 2021-04-02 DIAGNOSIS — O10013 Pre-existing essential hypertension complicating pregnancy, third trimester: Secondary | ICD-10-CM

## 2021-04-02 DIAGNOSIS — O10913 Unspecified pre-existing hypertension complicating pregnancy, third trimester: Secondary | ICD-10-CM | POA: Insufficient documentation

## 2021-04-02 DIAGNOSIS — O365930 Maternal care for other known or suspected poor fetal growth, third trimester, not applicable or unspecified: Secondary | ICD-10-CM | POA: Insufficient documentation

## 2021-04-02 DIAGNOSIS — F319 Bipolar disorder, unspecified: Secondary | ICD-10-CM | POA: Diagnosis not present

## 2021-04-02 DIAGNOSIS — F1920 Other psychoactive substance dependence, uncomplicated: Secondary | ICD-10-CM | POA: Diagnosis not present

## 2021-04-02 DIAGNOSIS — O09213 Supervision of pregnancy with history of pre-term labor, third trimester: Secondary | ICD-10-CM

## 2021-04-02 DIAGNOSIS — D649 Anemia, unspecified: Secondary | ICD-10-CM

## 2021-04-02 DIAGNOSIS — O09293 Supervision of pregnancy with other poor reproductive or obstetric history, third trimester: Secondary | ICD-10-CM

## 2021-04-02 DIAGNOSIS — O365990 Maternal care for other known or suspected poor fetal growth, unspecified trimester, not applicable or unspecified: Secondary | ICD-10-CM | POA: Insufficient documentation

## 2021-04-02 DIAGNOSIS — O99013 Anemia complicating pregnancy, third trimester: Secondary | ICD-10-CM | POA: Diagnosis not present

## 2021-04-02 DIAGNOSIS — O34219 Maternal care for unspecified type scar from previous cesarean delivery: Secondary | ICD-10-CM | POA: Diagnosis not present

## 2021-04-02 NOTE — Procedures (Signed)
JAMI BOGDANSKI October 18, 1985 [redacted]w[redacted]d  Fetus A Non-Stress Test Interpretation for 04/02/21  Indication:  FGR  Fetal Heart Rate A Mode: External Baseline Rate (A): 140 bpm Variability: Minimal, Moderate Accelerations: None Decelerations: None Multiple birth?: No  Uterine Activity Mode: Palpation, Toco Contraction Frequency (min): none Resting Tone Palpated: Relaxed Resting Time: Adequate  Interpretation (Fetal Testing) Nonstress Test Interpretation: Non-reactive Overall Impression: Reassuring for gestational age Comments: Dr. Judeth Cornfield reviewed tracing

## 2021-04-03 LAB — STREP GP B NAA: Strep Gp B NAA: NEGATIVE

## 2021-04-06 ENCOUNTER — Other Ambulatory Visit: Payer: Self-pay | Admitting: *Deleted

## 2021-04-06 DIAGNOSIS — O365990 Maternal care for other known or suspected poor fetal growth, unspecified trimester, not applicable or unspecified: Secondary | ICD-10-CM

## 2021-04-06 LAB — CERVICOVAGINAL ANCILLARY ONLY
Bacterial Vaginitis (gardnerella): NEGATIVE
Candida Glabrata: NEGATIVE
Candida Vaginitis: POSITIVE — AB
Chlamydia: NEGATIVE
Comment: NEGATIVE
Comment: NEGATIVE
Comment: NEGATIVE
Comment: NEGATIVE
Comment: NEGATIVE
Comment: NORMAL
Neisseria Gonorrhea: NEGATIVE
Trichomonas: NEGATIVE

## 2021-04-07 ENCOUNTER — Encounter: Payer: Medicaid Other | Attending: Family Medicine | Admitting: Registered"

## 2021-04-07 MED ORDER — PREDNISONE 20 MG PO TABS: 20.0000 mg | ORAL_TABLET | Freq: Every day | ORAL | 6 refills | Status: DC

## 2021-04-08 ENCOUNTER — Encounter: Payer: Medicaid Other | Admitting: Family Medicine

## 2021-04-08 ENCOUNTER — Ambulatory Visit: Payer: Medicaid Other

## 2021-04-08 ENCOUNTER — Encounter (HOSPITAL_COMMUNITY): Payer: Self-pay

## 2021-04-08 ENCOUNTER — Encounter: Payer: Self-pay | Admitting: Registered"

## 2021-04-08 ENCOUNTER — Other Ambulatory Visit: Payer: Self-pay | Admitting: Family Medicine

## 2021-04-08 MED ORDER — MICONAZOLE NITRATE 2 % VA CREA: 1.0000 | TOPICAL_CREAM | Freq: Every day | VAGINAL | 2 refills | Status: DC

## 2021-04-08 NOTE — Patient Instructions (Addendum)
Wendy Herman  04/08/2021   Your procedure is scheduled on:  04/14/2021  Arrive at 0730 at [employer] [facility] on [address] at [facility]  and [employer]. You are invited to use the FREE valet parking or use the Visitor's parking deck.  Pick up the phone at the desk and dial [phone].  Call this number if you have problems the morning of surgery: [phone]  Remember:   Do not eat food:(After Midnight) Desps de medianoche.  Do not drink clear liquids: (After Midnight) Desps de medianoche.  Take these medicines the morning of surgery with A SIP OF WATER:  Take suboxone, plaquenil, labetalol, prednisone and lamictal as prescribed.take clonidine as prescribed also.  May take phenergan, prilosec and reglan if needed that morning   Do not wear jewelry, make-up or nail polish.  Do not wear lotions, powders, or perfumes. Do not wear deodorant.  Do not shave 48 hours prior to surgery.  Do not bring valuables to the hospital.  [facility] is not   responsible for any belongings or valuables brought to the hospital.  Contacts, dentures or bridgework may not be worn into surgery.  Leave suitcase in the car. After surgery it may be brought to your room.  For patients admitted to the hospital, checkout time is 11:00 AM the day of              discharge.      Please read over the following fact sheets that you were given:     Preparing for Surgery

## 2021-04-08 NOTE — Progress Notes (Deleted)
Patient was scheduled for GDM class last week but was not able to make it. Patient called on way to Gestational Diabetes class on 04/07/21 to let the staff know she was stuck in traffic. She was already over 10 minutes late for class when she called and by the time she could get to the office, find a place to park and make it to the 4th floor it didn't make sense for her to come in. Also she is scheduled for C-section next week. Patient said she wanted to come anyway because her doctor wanted her to come to class. Patient did not show. 

## 2021-04-08 NOTE — Progress Notes (Deleted)
Patient was scheduled for GDM class last week but was not able to make it. Patient called on way to Gestational Diabetes class on 04/07/21 to let the staff know she was stuck in traffic. She was already over 10 minutes late for class when she called and by the time she could get to the office, find a place to park and make it to the 4th floor it didn't make sense for her to come in. Also she is scheduled for C-section next week. Patient said she wanted to come anyway because her doctor wanted her to come to class. Patient did not show.

## 2021-04-08 NOTE — Addendum Note (Signed)
Addended by: Levie Heritage on: 04/08/2021 01:06 PM   Modules accepted: Orders

## 2021-04-09 ENCOUNTER — Other Ambulatory Visit: Payer: Self-pay

## 2021-04-09 ENCOUNTER — Other Ambulatory Visit: Payer: Self-pay | Admitting: Family Medicine

## 2021-04-09 ENCOUNTER — Ambulatory Visit: Payer: Medicaid Other | Attending: Obstetrics and Gynecology

## 2021-04-09 ENCOUNTER — Ambulatory Visit: Payer: Medicaid Other | Admitting: *Deleted

## 2021-04-09 ENCOUNTER — Encounter: Payer: Self-pay | Admitting: *Deleted

## 2021-04-09 VITALS — BP 143/83 | HR 90

## 2021-04-09 DIAGNOSIS — O99343 Other mental disorders complicating pregnancy, third trimester: Secondary | ICD-10-CM

## 2021-04-09 DIAGNOSIS — O99013 Anemia complicating pregnancy, third trimester: Secondary | ICD-10-CM

## 2021-04-09 DIAGNOSIS — O365990 Maternal care for other known or suspected poor fetal growth, unspecified trimester, not applicable or unspecified: Secondary | ICD-10-CM | POA: Insufficient documentation

## 2021-04-09 DIAGNOSIS — O10013 Pre-existing essential hypertension complicating pregnancy, third trimester: Secondary | ICD-10-CM | POA: Diagnosis not present

## 2021-04-09 DIAGNOSIS — O99333 Smoking (tobacco) complicating pregnancy, third trimester: Secondary | ICD-10-CM | POA: Diagnosis not present

## 2021-04-09 DIAGNOSIS — I10 Essential (primary) hypertension: Secondary | ICD-10-CM | POA: Diagnosis not present

## 2021-04-09 DIAGNOSIS — Z3A36 36 weeks gestation of pregnancy: Secondary | ICD-10-CM

## 2021-04-09 DIAGNOSIS — M329 Systemic lupus erythematosus, unspecified: Secondary | ICD-10-CM

## 2021-04-09 DIAGNOSIS — O09293 Supervision of pregnancy with other poor reproductive or obstetric history, third trimester: Secondary | ICD-10-CM | POA: Diagnosis not present

## 2021-04-09 DIAGNOSIS — D649 Anemia, unspecified: Secondary | ICD-10-CM

## 2021-04-09 DIAGNOSIS — O34219 Maternal care for unspecified type scar from previous cesarean delivery: Secondary | ICD-10-CM | POA: Diagnosis not present

## 2021-04-09 DIAGNOSIS — F17210 Nicotine dependence, cigarettes, uncomplicated: Secondary | ICD-10-CM | POA: Diagnosis not present

## 2021-04-09 DIAGNOSIS — F319 Bipolar disorder, unspecified: Secondary | ICD-10-CM | POA: Diagnosis not present

## 2021-04-09 DIAGNOSIS — O365930 Maternal care for other known or suspected poor fetal growth, third trimester, not applicable or unspecified: Secondary | ICD-10-CM | POA: Diagnosis not present

## 2021-04-09 DIAGNOSIS — O09213 Supervision of pregnancy with history of pre-term labor, third trimester: Secondary | ICD-10-CM

## 2021-04-09 DIAGNOSIS — F1920 Other psychoactive substance dependence, uncomplicated: Secondary | ICD-10-CM

## 2021-04-09 DIAGNOSIS — O99323 Drug use complicating pregnancy, third trimester: Secondary | ICD-10-CM

## 2021-04-09 DIAGNOSIS — O99891 Other specified diseases and conditions complicating pregnancy: Secondary | ICD-10-CM | POA: Diagnosis not present

## 2021-04-09 MED ORDER — PREDNISONE 20 MG PO TABS: 20.0000 mg | ORAL_TABLET | Freq: Every day | ORAL | 6 refills | Status: AC

## 2021-04-09 MED ORDER — PREDNISONE 20 MG PO TABS
20.0000 mg | ORAL_TABLET | Freq: Every day | ORAL | 6 refills | Status: DC
Start: 2021-04-09 — End: 2021-04-09

## 2021-04-09 NOTE — Progress Notes (Signed)
Pt called requesting a refill of Prednisone. Pt made aware that a Rx for Prednisone 20 mg 1 tab PO daily. Increase to 40 mg daily was sent to pt's pharmacy on 7/6 but the pharmacy states they do not have it on file.A new Rx for or Prednisone 20 mg 1 tab PO daily. Increase to 40 mg daily, was resent on 04/09/21. Gunnison Chahal l Moana Munford, CMA

## 2021-04-09 NOTE — Procedures (Signed)
Wendy Herman Mar 05, 1986 [redacted]w[redacted]d  Fetus A Non-Stress Test Interpretation for 04/09/21  Indication:  FGR  Fetal Heart Rate A Mode: External Baseline Rate (A): 140 bpm Variability: Moderate Accelerations: None Decelerations: None Multiple birth?: No  Uterine Activity Mode: Palpation, Toco Contraction Frequency (min): none Resting Tone Palpated: Relaxed Resting Time: Adequate  Interpretation (Fetal Testing) Nonstress Test Interpretation: Non-reactive Overall Impression: Reassuring for gestational age Comments: Dr. Judeth Cornfield reviewed tracing.

## 2021-04-09 NOTE — Progress Notes (Signed)
Not in lobby when called X 2.

## 2021-04-12 ENCOUNTER — Telehealth (HOSPITAL_COMMUNITY): Payer: Self-pay | Admitting: *Deleted

## 2021-04-12 ENCOUNTER — Other Ambulatory Visit (HOSPITAL_COMMUNITY)
Admission: RE | Admit: 2021-04-12 | Discharge: 2021-04-12 | Disposition: A | Discharge status: Home / Self Care | Payer: Medicaid Other | Source: Ambulatory Visit | Attending: Obstetrics and Gynecology | Admitting: Obstetrics and Gynecology

## 2021-04-12 ENCOUNTER — Other Ambulatory Visit: Payer: Self-pay

## 2021-04-12 ENCOUNTER — Other Ambulatory Visit: Payer: Self-pay | Admitting: Family Medicine

## 2021-04-12 ENCOUNTER — Encounter (HOSPITAL_COMMUNITY)
Admission: RE | Admit: 2021-04-12 | Discharge: 2021-04-12 | Disposition: A | Discharge status: Home / Self Care | Payer: Medicaid Other | Source: Ambulatory Visit | Attending: Obstetrics and Gynecology | Admitting: Obstetrics and Gynecology

## 2021-04-12 DIAGNOSIS — Z20822 Contact with and (suspected) exposure to covid-19: Secondary | ICD-10-CM | POA: Diagnosis not present

## 2021-04-12 DIAGNOSIS — Z01812 Encounter for preprocedural laboratory examination: Secondary | ICD-10-CM | POA: Insufficient documentation

## 2021-04-12 DIAGNOSIS — O219 Vomiting of pregnancy, unspecified: Secondary | ICD-10-CM

## 2021-04-12 LAB — CBC
HCT: 35.8 % — ABNORMAL LOW (ref 36.0–46.0)
Hemoglobin: 12.2 g/dL (ref 12.0–15.0)
MCH: 30.3 pg (ref 26.0–34.0)
MCHC: 34.1 g/dL (ref 30.0–36.0)
MCV: 88.8 fL (ref 80.0–100.0)
Platelets: 246 10*3/uL (ref 150–400)
RBC: 4.03 MIL/uL (ref 3.87–5.11)
RDW: 13.9 % (ref 11.5–15.5)
WBC: 5.9 10*3/uL (ref 4.0–10.5)
nRBC: 0 % (ref 0.0–0.2)

## 2021-04-12 LAB — TYPE AND SCREEN
ABO/RH(D): A POS
Antibody Screen: NEGATIVE

## 2021-04-12 LAB — SARS CORONAVIRUS 2 (TAT 6-24 HRS): SARS Coronavirus 2: NEGATIVE

## 2021-04-12 NOTE — Telephone Encounter (Signed)
Pt had questions regarding loading dose of prednisone preop.  Spoke with Dr Adrian Blackwater.  Plan is to give this dose of prednisone the morning of surgery and the patient does not need to change her current prednisone medications.  Called pt and reviewed this information with her.  All questions answered and verbalized understanding.

## 2021-04-13 LAB — RPR: RPR Ser Ql: NONREACTIVE

## 2021-04-14 ENCOUNTER — Inpatient Hospital Stay (HOSPITAL_COMMUNITY): Payer: Medicaid Other | Admitting: Anesthesiology

## 2021-04-14 ENCOUNTER — Encounter (HOSPITAL_COMMUNITY): Payer: Self-pay | Admitting: Obstetrics and Gynecology

## 2021-04-14 ENCOUNTER — Encounter (HOSPITAL_COMMUNITY)
Admission: AD | Disposition: A | Discharge status: Home / Self Care | Payer: Self-pay | Source: Home / Self Care | Attending: Obstetrics and Gynecology

## 2021-04-14 ENCOUNTER — Other Ambulatory Visit: Payer: Self-pay

## 2021-04-14 ENCOUNTER — Inpatient Hospital Stay (HOSPITAL_COMMUNITY)
Admission: AD | Admit: 2021-04-14 | Discharge: 2021-04-17 | DRG: 787 | Disposition: A | Discharge status: Home / Self Care | Payer: Medicaid Other | Attending: Obstetrics and Gynecology | Admitting: Obstetrics and Gynecology

## 2021-04-14 DIAGNOSIS — O99330 Smoking (tobacco) complicating pregnancy, unspecified trimester: Secondary | ICD-10-CM | POA: Diagnosis present

## 2021-04-14 DIAGNOSIS — R87610 Atypical squamous cells of undetermined significance on cytologic smear of cervix (ASC-US): Secondary | ICD-10-CM | POA: Diagnosis present

## 2021-04-14 DIAGNOSIS — O24420 Gestational diabetes mellitus in childbirth, diet controlled: Secondary | ICD-10-CM | POA: Diagnosis present

## 2021-04-14 DIAGNOSIS — M329 Systemic lupus erythematosus, unspecified: Secondary | ICD-10-CM | POA: Diagnosis not present

## 2021-04-14 DIAGNOSIS — F17210 Nicotine dependence, cigarettes, uncomplicated: Secondary | ICD-10-CM | POA: Diagnosis not present

## 2021-04-14 DIAGNOSIS — O99324 Drug use complicating childbirth: Secondary | ICD-10-CM | POA: Diagnosis present

## 2021-04-14 DIAGNOSIS — F1120 Opioid dependence, uncomplicated: Secondary | ICD-10-CM

## 2021-04-14 DIAGNOSIS — F319 Bipolar disorder, unspecified: Secondary | ICD-10-CM | POA: Diagnosis present

## 2021-04-14 DIAGNOSIS — O99344 Other mental disorders complicating childbirth: Secondary | ICD-10-CM | POA: Diagnosis not present

## 2021-04-14 DIAGNOSIS — O169 Unspecified maternal hypertension, unspecified trimester: Secondary | ICD-10-CM | POA: Diagnosis present

## 2021-04-14 DIAGNOSIS — F310 Bipolar disorder, current episode hypomanic: Secondary | ICD-10-CM | POA: Diagnosis present

## 2021-04-14 DIAGNOSIS — O99892 Other specified diseases and conditions complicating childbirth: Secondary | ICD-10-CM | POA: Diagnosis present

## 2021-04-14 DIAGNOSIS — O24419 Gestational diabetes mellitus in pregnancy, unspecified control: Secondary | ICD-10-CM | POA: Diagnosis present

## 2021-04-14 DIAGNOSIS — F1190 Opioid use, unspecified, uncomplicated: Secondary | ICD-10-CM

## 2021-04-14 DIAGNOSIS — O99334 Smoking (tobacco) complicating childbirth: Secondary | ICD-10-CM | POA: Diagnosis present

## 2021-04-14 DIAGNOSIS — D6862 Lupus anticoagulant syndrome: Secondary | ICD-10-CM | POA: Diagnosis present

## 2021-04-14 DIAGNOSIS — O365930 Maternal care for other known or suspected poor fetal growth, third trimester, not applicable or unspecified: Secondary | ICD-10-CM | POA: Diagnosis present

## 2021-04-14 DIAGNOSIS — O1002 Pre-existing essential hypertension complicating childbirth: Secondary | ICD-10-CM | POA: Diagnosis present

## 2021-04-14 DIAGNOSIS — Z98891 History of uterine scar from previous surgery: Secondary | ICD-10-CM

## 2021-04-14 DIAGNOSIS — O99119 Other diseases of the blood and blood-forming organs and certain disorders involving the immune mechanism complicating pregnancy, unspecified trimester: Secondary | ICD-10-CM | POA: Diagnosis present

## 2021-04-14 DIAGNOSIS — F1290 Cannabis use, unspecified, uncomplicated: Secondary | ICD-10-CM | POA: Diagnosis present

## 2021-04-14 DIAGNOSIS — O34211 Maternal care for low transverse scar from previous cesarean delivery: Secondary | ICD-10-CM | POA: Diagnosis not present

## 2021-04-14 DIAGNOSIS — O9912 Other diseases of the blood and blood-forming organs and certain disorders involving the immune mechanism complicating childbirth: Secondary | ICD-10-CM | POA: Diagnosis present

## 2021-04-14 DIAGNOSIS — O1092 Unspecified pre-existing hypertension complicating childbirth: Secondary | ICD-10-CM | POA: Diagnosis not present

## 2021-04-14 DIAGNOSIS — F3112 Bipolar disorder, current episode manic without psychotic features, moderate: Secondary | ICD-10-CM | POA: Diagnosis present

## 2021-04-14 DIAGNOSIS — O365920 Maternal care for other known or suspected poor fetal growth, second trimester, not applicable or unspecified: Secondary | ICD-10-CM | POA: Diagnosis present

## 2021-04-14 DIAGNOSIS — Z3A37 37 weeks gestation of pregnancy: Secondary | ICD-10-CM | POA: Diagnosis not present

## 2021-04-14 DIAGNOSIS — O09899 Supervision of other high risk pregnancies, unspecified trimester: Secondary | ICD-10-CM

## 2021-04-14 LAB — COMPREHENSIVE METABOLIC PANEL
ALT: 41 U/L (ref 0–44)
AST: 37 U/L (ref 15–41)
Albumin: 2.9 g/dL — ABNORMAL LOW (ref 3.5–5.0)
Alkaline Phosphatase: 126 U/L (ref 38–126)
Anion gap: 6 (ref 5–15)
BUN: 16 mg/dL (ref 6–20)
CO2: 24 mmol/L (ref 22–32)
Calcium: 9.2 mg/dL (ref 8.9–10.3)
Chloride: 105 mmol/L (ref 98–111)
Creatinine, Ser: 0.66 mg/dL (ref 0.44–1.00)
GFR, Estimated: >60 mL/min (ref >60)
Glucose, Bld: 101 mg/dL — ABNORMAL HIGH (ref 70–99)
Potassium: 3.5 mmol/L (ref 3.5–5.1)
Sodium: 135 mmol/L (ref 135–145)
Total Bilirubin: 0.4 mg/dL (ref 0.3–1.2)
Total Protein: 5.7 g/dL — ABNORMAL LOW (ref 6.5–8.1)

## 2021-04-14 LAB — GLUCOSE, CAPILLARY
Glucose-Capillary: 110 mg/dL — ABNORMAL HIGH (ref 70–99)
Glucose-Capillary: 122 mg/dL — ABNORMAL HIGH (ref 70–99)

## 2021-04-14 SURGERY — Surgical Case
Anesthesia: Spinal

## 2021-04-14 MED ORDER — PRENATAL MULTIVITAMIN CH
1.0000 | ORAL_TABLET | Freq: Every day | ORAL | Status: DC
  Administered 2021-04-16 – 2021-04-17 (×2): 1 via ORAL
  Filled 2021-04-14 (×3): qty 1

## 2021-04-14 MED ORDER — HYDROCORTISONE NA SUCCINATE PF 100 MG IJ SOLR
25.0000 mg | Freq: Three times a day (TID) | INTRAMUSCULAR | Status: AC
  Administered 2021-04-14 – 2021-04-15 (×3): 25 mg via INTRAVENOUS
  Filled 2021-04-14 (×3): qty 0.5

## 2021-04-14 MED ORDER — NICOTINE 7 MG/24HR TD PT24
7.0000 mg | MEDICATED_PATCH | Freq: Every day | TRANSDERMAL | Status: DC
  Administered 2021-04-15 – 2021-04-16 (×3): 7 mg via TRANSDERMAL
  Filled 2021-04-14 (×4): qty 1

## 2021-04-14 MED ORDER — CLONIDINE HCL 0.2 MG PO TABS
0.2000 mg | ORAL_TABLET | Freq: Every day | ORAL | Status: DC
  Administered 2021-04-14 – 2021-04-16 (×3): 0.2 mg via ORAL
  Filled 2021-04-14 (×4): qty 1

## 2021-04-14 MED ORDER — FENTANYL CITRATE (PF) 100 MCG/2ML IJ SOLN
INTRAMUSCULAR | Status: DC | PRN
  Administered 2021-04-14: 15 ug via INTRATHECAL

## 2021-04-14 MED ORDER — FENTANYL CITRATE (PF) 100 MCG/2ML IJ SOLN
INTRAMUSCULAR | Status: AC
  Filled 2021-04-14: qty 2

## 2021-04-14 MED ORDER — DIPHENHYDRAMINE HCL 25 MG PO CAPS: 25.0000 mg | ORAL_CAPSULE | Freq: Four times a day (QID) | ORAL | Status: DC | PRN

## 2021-04-14 MED ORDER — OXYTOCIN-SODIUM CHLORIDE 30-0.9 UT/500ML-% IV SOLN
INTRAVENOUS | Status: AC
  Filled 2021-04-14: qty 500

## 2021-04-14 MED ORDER — TRANEXAMIC ACID 1000 MG/10ML IV SOLN
INTRAVENOUS | Status: DC | PRN
  Administered 2021-04-14: 1000 mg via INTRAVENOUS

## 2021-04-14 MED ORDER — HYDROCORTISONE NA SUCCINATE PF 100 MG IJ SOLR
50.0000 mg | Freq: Once | INTRAMUSCULAR | Status: AC
  Administered 2021-04-14: 50 mg via INTRAVENOUS
  Filled 2021-04-14: qty 1

## 2021-04-14 MED ORDER — SODIUM CHLORIDE 0.9% FLUSH: 9.0000 mL | INTRAVENOUS | Status: DC | PRN

## 2021-04-14 MED ORDER — GABAPENTIN 100 MG PO CAPS
100.0000 mg | ORAL_CAPSULE | Freq: Two times a day (BID) | ORAL | Status: DC
  Filled 2021-04-14 (×3): qty 1

## 2021-04-14 MED ORDER — WITCH HAZEL-GLYCERIN EX PADS: 1.0000 "application " | MEDICATED_PAD | CUTANEOUS | Status: DC | PRN

## 2021-04-14 MED ORDER — SCOPOLAMINE 1 MG/3DAYS TD PT72
MEDICATED_PATCH | TRANSDERMAL | Status: AC
  Filled 2021-04-14: qty 1

## 2021-04-14 MED ORDER — SOD CITRATE-CITRIC ACID 500-334 MG/5ML PO SOLN
30.0000 mL | Freq: Once | ORAL | Status: AC
  Administered 2021-04-14: 30 mL via ORAL

## 2021-04-14 MED ORDER — COCONUT OIL OIL
1.0000 "application " | TOPICAL_OIL | Status: DC | PRN
  Administered 2021-04-17: 1 via TOPICAL

## 2021-04-14 MED ORDER — SODIUM CHLORIDE 0.9 % IR SOLN
Status: DC | PRN
  Administered 2021-04-14: 1

## 2021-04-14 MED ORDER — SODIUM CHLORIDE 0.9% FLUSH: 3.0000 mL | INTRAVENOUS | Status: DC | PRN

## 2021-04-14 MED ORDER — NALOXONE HCL 0.4 MG/ML IJ SOLN: 0.4000 mg | INTRAMUSCULAR | Status: DC | PRN

## 2021-04-14 MED ORDER — DIPHENHYDRAMINE HCL 25 MG PO CAPS: 25.0000 mg | ORAL_CAPSULE | ORAL | Status: DC | PRN

## 2021-04-14 MED ORDER — LACTATED RINGERS IV SOLN: INTRAVENOUS | Status: DC | PRN

## 2021-04-14 MED ORDER — HYDROCORTISONE NA SUCCINATE PF 100 MG IJ SOLR: 25.0000 mg | Freq: Three times a day (TID) | INTRAMUSCULAR | Status: DC

## 2021-04-14 MED ORDER — OXYTOCIN-SODIUM CHLORIDE 30-0.9 UT/500ML-% IV SOLN: 2.5000 [IU]/h | INTRAVENOUS | Status: AC

## 2021-04-14 MED ORDER — OXYCODONE HCL 5 MG PO TABS: 5.0000 mg | ORAL_TABLET | ORAL | Status: DC | PRN

## 2021-04-14 MED ORDER — MORPHINE SULFATE (PF) 0.5 MG/ML IJ SOLN
INTRAMUSCULAR | Status: AC
  Filled 2021-04-14: qty 10

## 2021-04-14 MED ORDER — SIMETHICONE 80 MG PO CHEW
80.0000 mg | CHEWABLE_TABLET | Freq: Three times a day (TID) | ORAL | Status: DC
  Administered 2021-04-14 – 2021-04-17 (×9): 80 mg via ORAL
  Filled 2021-04-14 (×9): qty 1

## 2021-04-14 MED ORDER — CEFAZOLIN SODIUM-DEXTROSE 2-4 GM/100ML-% IV SOLN
2.0000 g | INTRAVENOUS | Status: AC
  Administered 2021-04-14: 2 g via INTRAVENOUS

## 2021-04-14 MED ORDER — TETANUS-DIPHTH-ACELL PERTUSSIS 5-2.5-18.5 LF-MCG/0.5 IM SUSY: 0.5000 mL | PREFILLED_SYRINGE | Freq: Once | INTRAMUSCULAR | Status: DC

## 2021-04-14 MED ORDER — BUPRENORPHINE HCL-NALOXONE HCL 2-0.5 MG SL SUBL
3.0000 | SUBLINGUAL_TABLET | Freq: Two times a day (BID) | SUBLINGUAL | Status: DC
Start: 2021-04-14 — End: 2021-04-17
  Administered 2021-04-14 – 2021-04-17 (×6): 3 via SUBLINGUAL
  Filled 2021-04-14 (×6): qty 3

## 2021-04-14 MED ORDER — DIPHENHYDRAMINE HCL 50 MG/ML IJ SOLN: 12.5000 mg | Freq: Four times a day (QID) | INTRAMUSCULAR | Status: DC | PRN

## 2021-04-14 MED ORDER — CEFAZOLIN SODIUM-DEXTROSE 2-4 GM/100ML-% IV SOLN
INTRAVENOUS | Status: AC
  Filled 2021-04-14: qty 100

## 2021-04-14 MED ORDER — ONDANSETRON HCL 4 MG/2ML IJ SOLN: 4.0000 mg | Freq: Four times a day (QID) | INTRAMUSCULAR | Status: DC | PRN

## 2021-04-14 MED ORDER — STERILE WATER FOR IRRIGATION IR SOLN
Status: DC | PRN
  Administered 2021-04-14: 1000 mL

## 2021-04-14 MED ORDER — DEXMEDETOMIDINE (PRECEDEX) IN NS 20 MCG/5ML (4 MCG/ML) IV SYRINGE
PREFILLED_SYRINGE | INTRAVENOUS | Status: DC | PRN
  Administered 2021-04-14: 8 ug via INTRAVENOUS
  Administered 2021-04-14: 4 ug via INTRAVENOUS

## 2021-04-14 MED ORDER — ONDANSETRON HCL 4 MG/2ML IJ SOLN: 4.0000 mg | Freq: Three times a day (TID) | INTRAMUSCULAR | Status: DC | PRN

## 2021-04-14 MED ORDER — POVIDONE-IODINE 10 % EX SWAB
2.0000 "application " | Freq: Once | CUTANEOUS | Status: AC
  Administered 2021-04-14: 2 via TOPICAL

## 2021-04-14 MED ORDER — MEPERIDINE HCL 25 MG/ML IJ SOLN: 6.2500 mg | INTRAMUSCULAR | Status: DC | PRN

## 2021-04-14 MED ORDER — LAMOTRIGINE 100 MG PO TABS
200.0000 mg | ORAL_TABLET | Freq: Every day | ORAL | Status: DC
  Administered 2021-04-15 – 2021-04-17 (×3): 200 mg via ORAL
  Filled 2021-04-14 (×3): qty 2

## 2021-04-14 MED ORDER — HYDROMORPHONE HCL 1 MG/ML IJ SOLN
INTRAMUSCULAR | Status: AC
  Filled 2021-04-14: qty 1

## 2021-04-14 MED ORDER — SENNOSIDES-DOCUSATE SODIUM 8.6-50 MG PO TABS
2.0000 | ORAL_TABLET | Freq: Every day | ORAL | Status: DC
  Administered 2021-04-15 – 2021-04-17 (×3): 2 via ORAL
  Filled 2021-04-14 (×3): qty 2

## 2021-04-14 MED ORDER — ONDANSETRON HCL 4 MG/2ML IJ SOLN
INTRAMUSCULAR | Status: DC | PRN
  Administered 2021-04-14: 4 mg via INTRAVENOUS

## 2021-04-14 MED ORDER — FENTANYL CITRATE (PF) 100 MCG/2ML IJ SOLN
25.0000 ug | INTRAMUSCULAR | Status: DC | PRN
  Administered 2021-04-14 (×3): 50 ug via INTRAVENOUS

## 2021-04-14 MED ORDER — MORPHINE SULFATE (PF) 0.5 MG/ML IJ SOLN
INTRAMUSCULAR | Status: DC | PRN
  Administered 2021-04-14: .15 mg via INTRATHECAL

## 2021-04-14 MED ORDER — BUPIVACAINE IN DEXTROSE 0.75-8.25 % IT SOLN
INTRATHECAL | Status: DC | PRN
  Administered 2021-04-14: 1.8 mL via INTRATHECAL

## 2021-04-14 MED ORDER — HYDROMORPHONE HCL 1 MG/ML IJ SOLN
1.0000 mg | Freq: Once | INTRAMUSCULAR | Status: AC
  Administered 2021-04-14: 1 mg via INTRAVENOUS

## 2021-04-14 MED ORDER — NALOXONE HCL 4 MG/10ML IJ SOLN
1.0000 ug/kg/h | INTRAVENOUS | Status: DC | PRN
  Filled 2021-04-14: qty 5

## 2021-04-14 MED ORDER — HYDROXYCHLOROQUINE SULFATE 200 MG PO TABS
400.0000 mg | ORAL_TABLET | Freq: Every day | ORAL | Status: DC
  Administered 2021-04-15 – 2021-04-17 (×3): 400 mg via ORAL
  Filled 2021-04-14 (×3): qty 2

## 2021-04-14 MED ORDER — SOD CITRATE-CITRIC ACID 500-334 MG/5ML PO SOLN
ORAL | Status: AC
  Filled 2021-04-14: qty 30

## 2021-04-14 MED ORDER — ENOXAPARIN SODIUM 40 MG/0.4ML IJ SOSY
40.0000 mg | PREFILLED_SYRINGE | INTRAMUSCULAR | Status: DC
  Administered 2021-04-15 – 2021-04-17 (×3): 40 mg via SUBCUTANEOUS
  Filled 2021-04-14 (×3): qty 0.4

## 2021-04-14 MED ORDER — ACETAMINOPHEN 500 MG PO TABS
1000.0000 mg | ORAL_TABLET | Freq: Four times a day (QID) | ORAL | Status: DC
  Administered 2021-04-14 – 2021-04-17 (×12): 1000 mg via ORAL
  Filled 2021-04-14 (×13): qty 2

## 2021-04-14 MED ORDER — OXYTOCIN-SODIUM CHLORIDE 30-0.9 UT/500ML-% IV SOLN
INTRAVENOUS | Status: DC | PRN
  Administered 2021-04-14: 300 mL via INTRAVENOUS

## 2021-04-14 MED ORDER — HYDROMORPHONE 1 MG/ML IV SOLN
INTRAVENOUS | Status: DC
  Administered 2021-04-14: 30 mg via INTRAVENOUS
  Administered 2021-04-14: 1.5 mg via INTRAVENOUS
  Administered 2021-04-14: 2.3 mg via INTRAVENOUS
  Administered 2021-04-15: 1.3 mg via INTRAVENOUS
  Administered 2021-04-15 (×3): 0.3 mg via INTRAVENOUS
  Filled 2021-04-14: qty 30

## 2021-04-14 MED ORDER — SCOPOLAMINE 1 MG/3DAYS TD PT72
1.0000 | MEDICATED_PATCH | TRANSDERMAL | Status: DC
  Administered 2021-04-14: 1.5 mg via TRANSDERMAL

## 2021-04-14 MED ORDER — PHENYLEPHRINE HCL-NACL 20-0.9 MG/250ML-% IV SOLN
INTRAVENOUS | Status: DC | PRN
  Administered 2021-04-14: 60 ug/min via INTRAVENOUS

## 2021-04-14 MED ORDER — DIPHENHYDRAMINE HCL 50 MG/ML IJ SOLN: 12.5000 mg | INTRAMUSCULAR | Status: DC | PRN

## 2021-04-14 MED ORDER — DIBUCAINE (PERIANAL) 1 % EX OINT: 1.0000 "application " | TOPICAL_OINTMENT | CUTANEOUS | Status: DC | PRN

## 2021-04-14 MED ORDER — DIPHENHYDRAMINE HCL 12.5 MG/5ML PO ELIX
12.5000 mg | ORAL_SOLUTION | Freq: Four times a day (QID) | ORAL | Status: DC | PRN
  Filled 2021-04-14: qty 5

## 2021-04-14 MED ORDER — SIMETHICONE 80 MG PO CHEW: 80.0000 mg | CHEWABLE_TABLET | ORAL | Status: DC | PRN

## 2021-04-14 MED ORDER — MENTHOL 3 MG MT LOZG: 1.0000 | LOZENGE | OROMUCOSAL | Status: DC | PRN

## 2021-04-14 SURGICAL SUPPLY — 33 items
BENZOIN TINCTURE PRP APPL 2/3 (GAUZE/BANDAGES/DRESSINGS) ×2 IMPLANT
CHLORAPREP W/TINT 26ML (MISCELLANEOUS) ×2 IMPLANT
CLAMP CORD UMBIL (MISCELLANEOUS) IMPLANT
CLOSURE STERI STRIP 1/2 X4 (GAUZE/BANDAGES/DRESSINGS) ×2 IMPLANT
CLOTH BEACON ORANGE TIMEOUT ST (SAFETY) ×2 IMPLANT
DRSG OPSITE POSTOP 4X10 (GAUZE/BANDAGES/DRESSINGS) ×2 IMPLANT
ELECT REM PT RETURN 9FT ADLT (ELECTROSURGICAL) ×2
ELECTRODE REM PT RTRN 9FT ADLT (ELECTROSURGICAL) ×1 IMPLANT
EXTRACTOR VACUUM M CUP 4 TUBE (SUCTIONS) IMPLANT
GLOVE BIOGEL PI IND STRL 7.0 (GLOVE) ×2 IMPLANT
GLOVE BIOGEL PI IND STRL 7.5 (GLOVE) ×2 IMPLANT
GLOVE BIOGEL PI INDICATOR 7.0 (GLOVE) ×2
GLOVE BIOGEL PI INDICATOR 7.5 (GLOVE) ×2
GLOVE ECLIPSE 7.5 STRL STRAW (GLOVE) ×2 IMPLANT
GOWN STRL REUS W/TWL LRG LVL3 (GOWN DISPOSABLE) ×6 IMPLANT
HEMOSTAT ARISTA ABSORB 3G PWDR (HEMOSTASIS) ×2 IMPLANT
KIT ABG SYR 3ML LUER SLIP (SYRINGE) IMPLANT
NEEDLE HYPO 25X5/8 SAFETYGLIDE (NEEDLE) IMPLANT
NS IRRIG 1000ML POUR BTL (IV SOLUTION) ×2 IMPLANT
PACK C SECTION WH (CUSTOM PROCEDURE TRAY) ×2 IMPLANT
PAD OB MATERNITY 4.3X12.25 (PERSONAL CARE ITEMS) ×2 IMPLANT
PENCIL SMOKE EVAC W/HOLSTER (ELECTROSURGICAL) ×2 IMPLANT
RTRCTR C-SECT PINK 25CM LRG (MISCELLANEOUS) ×2 IMPLANT
STRIP CLOSURE SKIN 1/2X4 (GAUZE/BANDAGES/DRESSINGS) ×2 IMPLANT
SUT VIC AB 0 CT1 36 (SUTURE) ×2 IMPLANT
SUT VIC AB 0 CTX 36 (SUTURE) ×4
SUT VIC AB 0 CTX36XBRD ANBCTRL (SUTURE) ×4 IMPLANT
SUT VIC AB 2-0 CT1 27 (SUTURE) ×1
SUT VIC AB 2-0 CT1 TAPERPNT 27 (SUTURE) ×1 IMPLANT
SUT VIC AB 4-0 KS 27 (SUTURE) ×2 IMPLANT
TOWEL OR 17X24 6PK STRL BLUE (TOWEL DISPOSABLE) ×2 IMPLANT
TRAY FOLEY W/BAG SLVR 14FR LF (SET/KITS/TRAYS/PACK) ×2 IMPLANT
WATER STERILE IRR 1000ML POUR (IV SOLUTION) ×2 IMPLANT

## 2021-04-14 NOTE — Op Note (Signed)
[facility] PROCEDURE DATE: 04/14/2021  PREOPERATIVE DIAGNOSES: Intrauterine pregnancy at [redacted]w[redacted]d weeks gestation;  Elective Repeat, Severe FGR  POSTOPERATIVE DIAGNOSES: The same  PROCEDURE: Repeat Low Transverse Cesarean Section  SURGEON:  Dr. Shonna Chock, MD  ASSISTANT:   Lynnda Shields, MD & Mart Piggs, MD  ANESTHESIOLOGY TEAM: Anesthesiologist: Mal Amabile, MD CRNA: Lucinda Dell, CRNA  INDICATIONS: Wendy Herman is a 35 y.o. [phone] at [redacted]w[redacted]d here for cesarean section secondary to the indications listed under preoperative diagnoses; please see preoperative note for further details.  The risks of surgery were discussed with the patient including but were not limited to: bleeding which may require transfusion or reoperation; infection which may require antibiotics; injury to bowel, bladder, ureters or other surrounding organs; injury to the fetus; need for additional procedures including hysterectomy in the event of a life-threatening hemorrhage; formation of adhesions; placental abnormalities wth subsequent pregnancies; incisional problems; thromboembolic phenomenon and other postoperative/anesthesia complications.  The patient concurred with the proposed plan, giving informed written consent for the procedure.    FINDINGS:  Viable female infant in cephalic presentation.  Apgars 8 and 9.  Clear amniotic fluid.  Intact placenta, three vessel cord.  Normal uterus, fallopian tubes and ovaries bilaterally.  ANESTHESIA: Spinal INTRAVENOUS FLUIDS: 1,000 ml   ESTIMATED BLOOD LOSS: 356 ml URINE OUTPUT:  200 ml SPECIMENS: Placenta sent to L&D COMPLICATIONS: None immediate  PROCEDURE IN DETAIL:  The patient preoperatively received intravenous antibiotics and had sequential compression devices applied to her lower extremities.  She was then taken to the operating room where spinal anesthesia was administered and was found to be adequate. She was then placed in a dorsal supine  position with a leftward tilt, and prepped and draped in a sterile manner.  A foley catheter was placed into her bladder and attached to constant gravity.  After an adequate timeout was performed, a Pfannenstiel skin incision was made with scalpel on her preexisting scar and carried through to the underlying layer of fascia. The fascia was incised in the midline, and this incision was extended bilaterally using the Mayo scissors. Kocher clamps were applied to the superior aspect of the fascial incision and the underlying rectus muscles were dissected off bluntly and sharply.  A similar process was carried out on the inferior aspect of the fascial incision. The rectus muscles were separated in the midline and the peritoneum was entered bluntly. The Alexis self-retaining retractor was introduced into the abdominal cavity.  Attention was turned to the lower uterine segment where a low transverse hysterotomy was made with a scalpel and extended bilaterally bluntly.  The infant was successfully delivered, the cord was clamped and cut after one minute, and the infant was handed over to the awaiting neonatology team. Uterine massage was then administered, and the placenta delivered intact with a three-vessel cord. The uterus was then cleared of clots and debris.  The hysterotomy was closed with 0 Vicryl in a running locked fashion, and an imbricating layer was also placed with 0 Vicryl.  Several figure-of-eight 0 Vicryl serosal stitches were placed to help with hemostasis.  The pelvis was cleared of all clot and debris. Hemostasis was confirmed on all surfaces.  The retractor was removed.  The peritoneum was closed with a 0 Vicryl running stitch. The fascia was then closed using 0 Vicryl in a running fashion.  The subcutaneous layer was irrigated, and the skin was closed with a 4-0 Vicryl subcuticular stitch. The patient tolerated the procedure well. Sponge, instrument  and needle counts were correct x 3.  She was taken to  the recovery room in stable condition.   Sheila Oats, MD OB Fellow, Faculty Practice 04/14/2021 10:03 AM

## 2021-04-14 NOTE — Anesthesia Preprocedure Evaluation (Addendum)
Anesthesia Evaluation  Patient identified by MRN, date of birth, ID band Patient awake    Reviewed: Allergy & Precautions, NPO status , Patient's Chart, lab work & pertinent test results, reviewed documented beta blocker date and time   Airway Mallampati: II  TM Distance: >3 FB Neck ROM: Full    Dental no notable dental hx. (+) Teeth Intact, Dental Advisory Given   Pulmonary Current Smoker and Patient abstained from smoking.,    Pulmonary exam normal breath sounds clear to auscultation       Cardiovascular hypertension, Pt. on home beta blockers and Pt. on medications Normal cardiovascular exam Rhythm:Regular Rate:Normal  Hx/o palpitations   Neuro/Psych PSYCHIATRIC DISORDERS Anxiety Depression Bipolar Disorder  Neuromuscular disease    GI/Hepatic GERD  Medicated,(+)     substance abuse  marijuana use, Suboxone therapy- last dose this am   Endo/Other  diabetes, Well Controlled, Gestational  Renal/GU negative Renal ROS  negative genitourinary   Musculoskeletal  (+) Arthritis , Fibromyalgia -  Abdominal (+) - obese,   Peds  Hematology  (+) anemia ,   Anesthesia Other Findings   Reproductive/Obstetrics (+) Pregnancy Previous C/Section                           Anesthesia Physical Anesthesia Plan  ASA: 2  Anesthesia Plan: Spinal   Post-op Pain Management:    Induction:   PONV Risk Score and Plan: 3 and Treatment may vary due to age or medical condition and Scopolamine patch - Pre-op  Airway Management Planned: Natural Airway  Additional Equipment:   Intra-op Plan:   Post-operative Plan:   Informed Consent: I have reviewed the patients History and Physical, chart, labs and discussed the procedure including the risks, benefits and alternatives for the proposed anesthesia with the patient or authorized representative who has indicated his/her understanding and acceptance.      Dental advisory given  Plan Discussed with: CRNA and Anesthesiologist  Anesthesia Plan Comments:        Anesthesia Quick Evaluation

## 2021-04-14 NOTE — Transfer of Care (Signed)
Immediate Anesthesia Transfer of Care Note  Patient: Wendy Herman   Procedure(s) Performed: CESAREAN SECTION  Patient Location: PACU  Anesthesia Type:Spinal  Level of Consciousness: awake, alert , oriented and patient cooperative  Airway & Oxygen Therapy: Patient Spontanous Breathing  Post-op Assessment: Report given to RN and Post -op Vital signs reviewed and stable  Post vital signs: Reviewed and stable  Last Vitals:  Vitals Value Taken Time  BP 135/76 04/14/21 1009  Temp    Pulse 79 04/14/21 1010  Resp 18 04/14/21 1010  SpO2 100 % 04/14/21 1010  Vitals shown include unvalidated device data.  Last Pain:  Vitals:   04/14/21 0753  TempSrc: Oral  PainSc: 0-No pain         Complications: No notable events documented.

## 2021-04-14 NOTE — Anesthesia Postprocedure Evaluation (Signed)
Anesthesia Post Note  Patient: Wendy Herman  Procedure(s) Performed: CESAREAN SECTION     Patient location during evaluation: PACU Anesthesia Type: Spinal Level of consciousness: oriented and awake and alert Pain management: pain level controlled Vital Signs Assessment: post-procedure vital signs reviewed and stable Respiratory status: spontaneous breathing, respiratory function stable and nonlabored ventilation Cardiovascular status: blood pressure returned to baseline and stable Postop Assessment: no headache, no backache, no apparent nausea or vomiting, spinal receding and patient able to bend at knees Anesthetic complications: no   No notable events documented.  Last Vitals:  Vitals:   04/14/21 1030 04/14/21 1045  BP: (!) 147/93 (!) 148/90  Pulse: 78 76  Resp: 15 18  Temp:    SpO2: 100% 100%    Last Pain:  Vitals:   04/14/21 1045  TempSrc:   PainSc: 8    Pain Goal:    LLE Motor Response: Purposeful movement (04/14/21 1030) LLE Sensation: Tingling (04/14/21 1030) RLE Motor Response: Purposeful movement (04/14/21 1030) RLE Sensation: Tingling (04/14/21 1030)     Epidural/Spinal Function Cutaneous sensation: Tingles (04/14/21 1030), Patient able to flex knees: Yes (04/14/21 1030), Patient able to lift hips off bed: Yes (04/14/21 1030), Back pain beyond tenderness at insertion site: No (04/14/21 1030), Progressively worsening motor and/or sensory loss: No (04/14/21 1030), Bowel and/or bladder incontinence post epidural: No (04/14/21 1030)  [name]

## 2021-04-14 NOTE — Lactation Note (Addendum)
This note was copied from a baby's chart. Lactation Consultation Note  Patient Name: Wendy Herman [id number] Date: 04/14/2021 Reason for consult: Initial assessment;Early term 37-38.6wks;Infant < 6lbs;Other (Comment) (SUD, Mother on Suboxone) Age:34 hours  LC in to visit with P3 Mom of ET infant weighing 4 lbs 6 oz at birth.  Baby has had low temp and placed under warmer.  First CBG 55. Baby has been fed twice by bottle 24 cal formula (10 & 16 ml)  Mom currently on Suboxone for opioid addiction.  Mom on Lamictal and Klonodine for bipolar disorder.  MD spoke with [facility] and is concerned about baby breastfeeding with these medications which can lead to problems with feeding, and discussed with Mom that she would like to watch and evaluate baby for a few days.  Mom will be supported to establish her milk supply by pumping, but breast milk will not be used presently.  Mom is C/O pain 8 out of 10.  C/S and on PCA pump for pain.  LC set up DEBP with instructions, but Mom states she wants to wait due to her extreme discomfort.    LC need to F/U to reinforce importance of frequent pumping (8 times per 24 hrs) to support a full milk supply.   Mom has [facility] and referral faxed for pump at discharge.    Lactation Tools Discussed/Used Tools: Pump Breast pump type: Double-Electric Breast Pump Pump Education: Setup, frequency, and cleaning;Milk Storage Reason for Pumping: support milk supply/infant <5 lbs Pumping frequency: encouraged Mom to pump Q 3 hrs  Interventions Interventions: Breast feeding basics reviewed;Skin to skin;Hand express;Breast massage;DEBP;Education  Discharge Pump: DEBP WIC Program: Yes  Consult Status Consult Status: Follow-up Date: 04/15/21 Follow-up type: In-patient    Wendy Herman 04/14/2021, 2:55 PM

## 2021-04-14 NOTE — H&P (Signed)
OBSTETRIC ADMISSION HISTORY AND PHYSICAL  Wendy Herman is a 35 y.o. female G3P0201 with IUP at 78w0dby L/7 presenting for scheduled elective repeat Cesarean delivery. She reports +FMs, No LOF, no VB, no blurry vision, headaches or peripheral edema, and RUQ pain.  She plans on breast and formula feeding. She is undecided for birth control. [facility]  Dating: By L/7 --->  Estimated Date of Delivery: 05/05/21  Sono:  _0 , CWD, normal anatomy, cephalic presentation, 29371I 2.6% EFW   Prenatal History/Complications:  - Lupus (taking prednisone 20-433mdaily, plaquenil) - A1GDM - cHTN (labetalol 400 BID in pregnancy) - H/o Cesarean x1 - Bipolar Disorder (Lamictal, clonidine) -Opioid use disorder  Past Medical History: Past Medical History:  Diagnosis Date   Anxiety    Arthritis    Bipolar 1 disorder (HCC)    Bipolar disorder (HCC)    Chronic pain    Depression    Fibromyalgia    Hypertension    'clonidine" for that   Infection    UTI   [facility]   Neutropenia ([facility]   Suboxone maintenance treatment complicating pregnancy, antepartum ([place]    Past Surgical History: Past Surgical History:  Procedure Laterality Date   CESAREAN SECTION     WISDOM TOOTH EXTRACTION      Obstetrical History: OB History     Gravida  3   Para  2   Term      Preterm  2   AB  0   Living  1      SAB      IAB      Ectopic      Multiple      Live Births  2        Obstetric Comments  #2:C/s "her lupus was attacking the baby"  (CA) #1 ChScientist, research (medical)       Social History Social History   Socioeconomic History   Marital status: Married    Spouse name: Not on file   Number of children: Not on file   Years of education: Not on file   Highest education level: Not on file  Occupational History   Not on file  Tobacco Use   Smoking status: Every Day    Packs/day: 0.50    Years: 15.00    Pack years: 7.50    Types:  Cigarettes   Smokeless tobacco: Never  Vaping Use   Vaping Use: Never used  Substance and Sexual Activity   Alcohol use: No   Drug use: Yes    Types: Marijuana    Comment: takes Suboxone- but not daily last used marijuana 12/2020   Sexual activity: Yes  Other Topics Concern   Not on file  Social History Narrative   Not on file   Social Determinants of Health   Financial Resource Strain: Not on file  Food Insecurity: Not on file  Transportation Needs: Not on file  Physical Activity: Not on file  Stress: Not on file  Social Connections: Not on file    Family History: Family History  Problem Relation Age of Onset   Hypertension Mother    Heart attack Father        In his 5066sDied from it.   Depression Father    Breast cancer Maternal Aunt    Diabetes Maternal Aunt    Diabetes Maternal Aunt    Pancreatic cancer Maternal Uncle    Rheum  arthritis Maternal Grandmother    Lupus Maternal Grandmother    Diabetes Maternal Grandmother    Heart disease Maternal Grandfather    Diabetes Maternal Grandfather    Heart attack Maternal Grandfather     Allergies: Allergies  Allergen Reactions   Sulfa Antibiotics Swelling    Mouth/facial swelling   Codeine Swelling    "not actually allergic, just doesn't want to take it   Nsaids Other (See Comments)    Interacts with GERD    Medications Prior to Admission  Medication Sig Dispense Refill Last Dose   Biotin 1000 MCG CHEW Chew 1,000 mcg by mouth daily.      Buprenorphine HCl-Naloxone HCl (SUBOXONE) 2-0.5 MG FILM Place 3 Film under the tongue 2 (two) times daily for 14 days. 84 each 0    CALCIUM-VITAMIN D PO Take 1 tablet by mouth daily.      clobetasol cream (TEMOVATE) 5.17 % Apply 1 application topically daily as needed (irritation).      cloNIDine (CATAPRES) 0.2 MG tablet Take 1 tablet (0.2 mg total) by mouth at bedtime. (Patient taking differently: Take 0.2 mg by mouth daily.) 30 tablet 5    Cranberry 1000 MG CAPS Take 1,000  mg by mouth daily.      hydroxychloroquine (PLAQUENIL) 200 MG tablet Take 2 tablets (400 mg total) by mouth daily. 60 tablet 5 04/14/2021   labetalol (NORMODYNE) 200 MG tablet Take 2 tablets (400 mg total) by mouth 2 (two) times daily. 120 tablet 3 04/14/2021   lamoTRIgine (LAMICTAL) 200 MG tablet Take 1 tablet (200 mg total) by mouth daily. 30 tablet 5    metoCLOPramide (REGLAN) 10 MG tablet Take 1 tablet (10 mg total) by mouth 4 (four) times daily as needed for nausea or vomiting. (Patient taking differently: Take 10 mg by mouth in the morning, at noon, in the evening, and at bedtime.) 120 tablet 2 04/14/2021   Multiple Vitamins-Minerals (EYE VITAMINS & MINERALS PO) Take 1 tablet by mouth daily.      Omega-3 Fatty Acids (FISH OIL) 1000 MG CAPS Take 1,000 mg by mouth daily.      omeprazole (PRILOSEC) 20 MG capsule TAKE 2 CAPSULES BY MOUTH TWICE DAILY AS NEEDED FOR ACID REFLUX. (Patient taking differently: Take 40 mg by mouth 2 (two) times daily before a meal. TAKE 2 CAPSULES BY MOUTH TWICE DAILY FOR ACID REFLUX.) 120 capsule 3 04/14/2021   ondansetron (ZOFRAN ODT) 4 MG disintegrating tablet Take 1 tablet (4 mg total) by mouth every 6 (six) hours. (Patient taking differently: Take 4 mg by mouth every 8 (eight) hours as needed for nausea or vomiting.) 120 tablet 3 04/14/2021   polyethylene glycol powder (GLYCOLAX/MIRALAX) 17 GM/SCOOP powder TAKE 17 G BY MOUTH 2 (TWO) TIMES DAILY AS NEEDED. (Patient taking differently: Take 17 g by mouth daily.) 510 g 1    Prenatal Vit-Fe Fumarate-FA (PREPLUS) 27-1 MG TABS Take 1 tablet by mouth daily. 30 tablet 13    promethazine (PHENERGAN) 25 MG suppository Place 25 mg rectally every 6 (six) hours as needed for nausea or vomiting.      promethazine (PHENERGAN) 25 MG tablet Take 1 tablet (25 mg total) by mouth every 6 (six) hours as needed for nausea or vomiting. (Patient taking differently: Take 25 mg by mouth every 6 (six) hours.) 60 tablet 3 04/14/2021   Accu-Chek Softclix  Lancets lancets DX: O24.419. Check BS QID 100 each 12    Blood Glucose Monitoring Suppl (ACCU-CHEK GUIDE) w/Device KIT DX: O24.419. Check  BS QID 1 kit 0    Continuous Blood Gluc Receiver (DEXCOM G6 RECEIVER) DEVI 1 Units by Does not apply route See admin instructions. For checking CBG 4 times a day for gestational diabetes 1 each 0    Continuous Blood Gluc Sensor (DEXCOM G6 SENSOR) MISC 1 Units by Does not apply route See admin instructions. Replace every 10 days 6 each 3    Continuous Blood Gluc Transmit (DEXCOM G6 TRANSMITTER) MISC USE TO CHECK SUGAR 6 each 3    glucose blood (ACCU-CHEK GUIDE) test strip DX: O24.419. Check BS QID 100 each 12    miconazole (MONISTAT 7) 2 % vaginal cream Place 1 Applicatorful vaginally at bedtime. Apply for seven nights 30 g 2    predniSONE (DELTASONE) 20 MG tablet Take 1 tablet (20 mg total) by mouth daily. Increase to 44m daily with flare 90 tablet 6      Review of Systems   All systems reviewed and negative except as stated in HPI  Blood pressure (!) 133/97, pulse 95, temperature 98.2 F (36.8 C), temperature source Oral, resp. rate 18, height _0  (1.6 m), weight 73 kg, last menstrual period 07/29/2020, SpO2 100 %. General appearance: alert, cooperative, and appears stated age Lungs: normal WOB Heart: regular rate  Abdomen: soft, non-tender Extremities: no sign of DVT Presentation: cephalic Fetal monitoring 134 on Dopplers      Prenatal labs: ABO, Rh: --/--/A POS (07/11 1130) Antibody: NEG (07/11 1130) Rubella: 10.10 (12/30 0925) RPR: NON REACTIVE (07/11 1123)  HBsAg: Negative (12/30 0925)  HIV: Non Reactive (06/15 0905)  GBS: Negative/-- (06/30 1620)  1 hr Glucola: 114 /canceled/canceled (pt not fasting) Genetic screening  low risk Anatomy [place] wnl except for severe IUGR  Prenatal Transfer Tool  Maternal Diabetes: Yes:  Diabetes Type:  Diet controlled Genetic Screening: Normal Maternal Ultrasounds/Referrals: IUGR Fetal Ultrasounds or  other Referrals:  Referred to Materal Fetal Medicine  Maternal Substance Abuse:  Yes:  Type: Smoker Significant Maternal Medications:  Meds include: Other: plaquenil, prednisone, lamictal, clonidine, labetalol) Significant Maternal Lab Results: Group B Strep negative  Results for orders placed or performed during the hospital encounter of 04/14/21 (from the past 24 hour(s))  Glucose, capillary   Collection Time: 04/14/21  8:02 AM  Result Value Ref Range   Glucose-Capillary 110 (H) 70 - 99 mg/dL    Patient Active Problem List   Diagnosis Date Noted   History of cesarean section 04/14/2021   Gestational diabetes mellitus (GDM) in third trimester 04/01/2021   Vaginal bleeding in pregnancy 02/13/2021   IUGR (intrauterine growth restriction) affecting care of mother, second trimester 02/04/2021   Systemic lupus erythematosus affecting pregnancy (HD[place] 10/08/2020   ASCUS with positive high risk HPV cervical 10/01/2020   Supervision of high-risk pregnancy 09/23/2020   History of preterm delivery, currently pregnant 09/23/2020   Tobacco smoking affecting pregnancy, antepartum 09/23/2020   Hypertension affecting pregnancy, antepartum 09/23/2020   Lupus anticoagulant affecting pregnancy, antepartum (HC[place] 09/23/2020   Suboxone maintenance treatment complicating pregnancy, antepartum, unspecified trimester ([place] 09/23/2020   Bipolar 1 disorder with moderate mania (H[place] 04/27/2020   Vitamin D insufficiency 10/21/2019   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 G3P0201 at 374w0dere for scheduled elective repeat Cesarean given h/o Cesarean x1.  #  Scheduled Repeat Cesarean: The risks of cesarean section were discussed with the patient including but  were not limited to: bleeding which may require transfusion or reoperation; infection which may require antibiotics; injury to bowel, bladder, ureters or other surrounding organs; injury to the fetus; need for additional procedures including hysterectomy in the event of a life-threatening hemorrhage; placental abnormalities wth subsequent pregnancies, incisional problems, thromboembolic phenomenon and other postoperative/anesthesia complications.  The patient concurred with the proposed plan, giving informed written consent for the procedure.  Patient has been NPO since yesterday; she will remain NPO for procedure. Anesthesia and OR aware.  Preoperative prophylactic antibiotics and SCDs ordered on call to the OR.  To OR when ready.  #Pain: Per anesthesia #FWB: Wnl on Dopplers #ID:  GBS negative #MOF: breast and formula #MOC: undecided #Circ: n/a #cHTN: labetalol in pregnancy. Asymptomatic. F/u Preeclampsia labs on admission. CTM. #Opioid Use Disorder: continue suboxone #A1GDM: plan for POD#1 FBG. #Lupus: stress-dose steroids. Continue plaquenil post-op.  Randa Ngo, MD OB Fellow, Faculty Practice 04/14/2021 8:58 AM

## 2021-04-14 NOTE — Discharge Summary (Signed)
Postpartum Discharge Summary  Date of Service updated 04/17/21     Patient Name: Wendy Herman DOB: Sep 17, 1986 MRN: [id number]  Date of admission: 04/14/2021 Delivery date:04/14/2021  Delivering provider: Laurey Arrow [place]  Date of discharge: 04/17/2021  Admitting diagnosis: History of cesarean section [Z98.891] Intrauterine pregnancy: [redacted]w[redacted]d    Secondary diagnosis:  Active Problems:   SLE (systemic lupus erythematosus) ([place]   Bipolar affective disorder, current episode hypomanic (HL[place]   Bipolar 1 disorder with moderate mania (HJ[place]   History of preterm delivery, currently pregnant   Tobacco smoking affecting pregnancy, antepartum   Hypertension affecting pregnancy, antepartum   Lupus anticoagulant affecting pregnancy, antepartum ([place]   Suboxone maintenance treatment complicating pregnancy, antepartum, unspecified trimester (H[place]ill[place]   ASCUS with positive high risk HPV cervical   IUGR (intrauterine growth restriction) affecting care of mother, second trimester   Gestational diabetes mellitus (GDM) in third trimester   History of cesarean section  Additional problems: as noted above  Discharge diagnosis: Repeat Cesarean delivery delivered       Post partum procedures: n/a Augmentation: N/A Complications: None  Hospital course: Sceduled C/S   35y.o. yo [id number] 394w0das admitted to the hospital 04/14/2021 for scheduled cesarean section with the following indication:Elective Repeat and FGR. Given history of lupus, pt was dosed with stress steroids at time of Cesarean. She was also continued on her home plaquenil in addition to lamictal and clonidine for Bipolar I disorder. Delivery details are as follows:  Membrane Rupture Time/Date: 9:23 AM ,04/14/2021   Delivery Method:C-Section, Low Transverse  Details of operation can be found in separate operative note.  Patient had an uncomplicated postpartum course.  She is ambulating, tolerating a regular diet, passing flatus, and  urinating well. Patient is discharged home in stable condition on  04/17/21. Pt referred to Rheumatology given history of lupus and need to establish specialty care.        Newborn Data: Birth date:04/14/2021  Birth time:9:23 AM  Gender:Female  Living status:Living  Apgars:8 ,9  Weight:2005 g     Magnesium Sulfate received: No BMZ received: No Rhophylac:N/A MMR:N/A T-DaP:Given prenatally Flu: Nooffered prior to discharge Transfusion:No  Physical exam  Vitals:   04/16/21 1342 04/16/21 2104 04/16/21 2307 04/17/21 0553  BP: (!) 146/83 130/79 (!) 135/91 130/80  Pulse: 81 81  77  Resp: 17 18  16   Temp: 98.3 F (36.8 C) 98.7 F (37.1 C)  97.9 F (36.6 C)  TempSrc: Oral Oral  Oral  SpO2:  99% 99% 98%  Weight:      Height:       General: alert, cooperative, and no distress Lochia: appropriate Uterine Fundus: firm Incision: honeycomb dressing c/d/i DVT Evaluation: No evidence of DVT seen on physical exam. Labs: Lab Results  Component Value Date   WBC 8.5 04/15/2021   HGB 10.7 (L) 04/15/2021   HCT 32.1 (L) 04/15/2021   MCV 90.7 04/15/2021   PLT 221 04/15/2021   CMP Latest Ref Rng & Units 04/14/2021  Glucose 70 - 99 mg/dL 101(H)  BUN 6 - 20 mg/dL 16  Creatinine 0.44 - 1.00 mg/dL 0.66  Sodium 135 - 145 mmol/L 135  Potassium 3.5 - 5.1 mmol/L 3.5  Chloride 98 - 111 mmol/L 105  CO2 22 - 32 mmol/L 24  Calcium 8.9 - 10.3 mg/dL 9.2  Total Protein 6.5 - 8.1 g/dL 5.7(L)  Total Bilirubin 0.3 - 1.2 mg/dL 0.4  Alkaline Phos 38 - 126 U/L 126  AST  15 - 41 U/L 37  ALT 0 - 44 U/L 41   Edinburgh Score: Edinburgh Postnatal Depression Scale Screening Tool 04/16/2021  I have been able to laugh and see the funny side of things. 0  I have looked forward with enjoyment to things. 0  I have blamed myself unnecessarily when things went wrong. 2  I have been anxious or worried for no good reason. 2  I have felt scared or panicky for no good reason. 2  Things have been getting on top of  me. 2  I have been so unhappy that I have had difficulty sleeping. 1  I have felt sad or miserable. 1  I have been so unhappy that I have been crying. 1  The thought of harming myself has occurred to me. 0  Edinburgh Postnatal Depression Scale Total 11     After visit meds:  Allergies as of 04/17/2021       Reactions   Sulfa Antibiotics Swelling   Mouth/facial swelling   Codeine Swelling   "not actually allergic, just doesn't want to take it   Nsaids Other (See Comments)   Interacts with GERD        Medication List     STOP taking these medications    Accu-Chek Guide test strip Generic drug: glucose blood   Accu-Chek Guide w/Device Kit   Accu-Chek Softclix Lancets lancets   clobetasol cream 0.05 % Commonly known as: TEMOVATE   Cranberry 1000 MG Caps   Dexcom G6 Receiver Devi   Dexcom G6 Sensor Misc   Dexcom G6 Transmitter Misc   labetalol 200 MG tablet Commonly known as: NORMODYNE   metoCLOPramide 10 MG tablet Commonly known as: REGLAN   miconazole 2 % vaginal cream Commonly known as: MONISTAT 7   omeprazole 20 MG capsule Commonly known as: PRILOSEC   ondansetron 4 MG disintegrating tablet Commonly known as: Zofran ODT   polyethylene glycol powder 17 GM/SCOOP powder Commonly known as: GLYCOLAX/MIRALAX   promethazine 25 MG suppository Commonly known as: PHENERGAN   promethazine 25 MG tablet Commonly known as: PHENERGAN       TAKE these medications    acetaminophen 500 MG tablet Commonly known as: TYLENOL Take 2 tablets (1,000 mg total) by mouth every 6 (six) hours.   Biotin 1000 MCG Chew Chew 1,000 mcg by mouth daily.   Buprenorphine HCl-Naloxone HCl 2-0.5 MG Film Commonly known as: Suboxone Place 3 Film under the tongue 2 (two) times daily for 14 days.   CALCIUM-VITAMIN D PO Take 1 tablet by mouth daily.   coconut oil Oil Apply 1 application topically as needed.   EYE VITAMINS & MINERALS PO Take 1 tablet by mouth daily.    Fish Oil 1000 MG Caps Take 1,000 mg by mouth daily.   hydroxychloroquine 200 MG tablet Commonly known as: PLAQUENIL Take 2 tablets (400 mg total) by mouth daily.   lamoTRIgine 200 MG tablet Commonly known as: LAMICTAL Take 1 tablet (200 mg total) by mouth daily.   NIFEdipine 60 MG 24 hr tablet Commonly known as: ADALAT CC Take 1 tablet (60 mg total) by mouth daily.   oxyCODONE 5 MG immediate release tablet Commonly known as: Roxicodone Take 1 tablet (5 mg total) by mouth every 4 (four) hours as needed for severe pain.   predniSONE 20 MG tablet Commonly known as: DELTASONE Take 1 tablet (20 mg total) by mouth daily. Increase to 59m daily with flare   PrePLUS 27-1 MG Tabs Take 1 tablet  by mouth daily.       ASK your doctor about these medications    cloNIDine 0.2 MG tablet Commonly known as: CATAPRES Take 1 tablet (0.2 mg total) by mouth at bedtime.         Discharge home in stable condition Infant Feeding:  breast and formula Infant Disposition:home with mother Discharge instruction: per After Visit Summary and Postpartum booklet. Activity: Advance as tolerated. Pelvic rest for 6 weeks.  Diet: routine diet Future Appointments: Future Appointments  Date Time Provider [place]  04/22/2021  3:15 PM Truett Mainland, DO [facility] None  05/19/2021  3:30 PM Nehemiah Settle Tanna Savoy, DO [facility] None   Follow up Visit: Message sent to [facility] by [facility].  Please schedule this patient for a In person postpartum visit in 6 weeks with the following provider: MD. Additional Postpartum F/U:Postpartum Depression checkup, Colpo, 2 hour GTT, Incision check 1 week, and BP check 1 week  High risk pregnancy complicated by:  lupus, cHTN, A1GDM, Bipolar I disorder, now h/o Cesarean x2 Delivery mode:  C-Section, Low Transverse  Anticipated Birth Control:  Unsure   01/11/4642 Arrie Senate, MD

## 2021-04-14 NOTE — Anesthesia Procedure Notes (Signed)
Spinal  Patient location during procedure: OR Start time: 04/14/2021 8:59 AM End time: 04/14/2021 9:03 AM Reason for block: surgical anesthesia Staffing Performed: anesthesiologist  Anesthesiologist: Mal Amabile, MD Preanesthetic Checklist Completed: patient identified, IV checked, site marked, risks and benefits discussed, surgical consent, monitors and equipment checked, pre-op evaluation and timeout performed Spinal Block Patient position: sitting Prep: DuraPrep and site prepped and draped Patient monitoring: heart rate, cardiac monitor, continuous pulse ox and blood pressure Approach: midline Location: L3-4 Injection technique: single-shot Needle Needle type: Pencan  Needle gauge: 24 G Needle length: 9 cm Needle insertion depth: 6 cm Assessment Sensory level: T4 Events: CSF return Additional Notes Patient tolerated procedure well. Adequate sensory level.

## 2021-04-15 ENCOUNTER — Encounter (HOSPITAL_COMMUNITY): Payer: Self-pay | Admitting: Obstetrics and Gynecology

## 2021-04-15 LAB — CBC
HCT: 32.1 % — ABNORMAL LOW (ref 36.0–46.0)
Hemoglobin: 10.7 g/dL — ABNORMAL LOW (ref 12.0–15.0)
MCH: 30.2 pg (ref 26.0–34.0)
MCHC: 33.3 g/dL (ref 30.0–36.0)
MCV: 90.7 fL (ref 80.0–100.0)
Platelets: 221 10*3/uL (ref 150–400)
RBC: 3.54 MIL/uL — ABNORMAL LOW (ref 3.87–5.11)
RDW: 14.1 % (ref 11.5–15.5)
WBC: 8.5 10*3/uL (ref 4.0–10.5)
nRBC: 0 % (ref 0.0–0.2)

## 2021-04-15 LAB — GLUCOSE, CAPILLARY: Glucose-Capillary: 110 mg/dL — ABNORMAL HIGH (ref 70–99)

## 2021-04-15 MED ORDER — MORPHINE SULFATE (PF) 2 MG/ML IV SOLN
2.0000 mg | Freq: Four times a day (QID) | INTRAVENOUS | Status: DC | PRN
Start: 2021-04-15 — End: 2021-04-17

## 2021-04-15 MED ORDER — OXYCODONE HCL 5 MG PO TABS
15.0000 mg | ORAL_TABLET | ORAL | Status: DC | PRN
  Administered 2021-04-15 (×2): 15 mg via ORAL
  Filled 2021-04-15 (×3): qty 3

## 2021-04-15 MED ORDER — OXYCODONE HCL 5 MG PO TABS
5.0000 mg | ORAL_TABLET | ORAL | Status: AC
  Administered 2021-04-15: 5 mg via ORAL
  Filled 2021-04-15: qty 1

## 2021-04-15 MED ORDER — NIFEDIPINE ER OSMOTIC RELEASE 30 MG PO TB24
30.0000 mg | ORAL_TABLET | Freq: Every day | ORAL | Status: DC
  Administered 2021-04-15 – 2021-04-16 (×3): 30 mg via ORAL
  Filled 2021-04-15 (×2): qty 1

## 2021-04-15 NOTE — Social Work (Signed)
CLINICAL SOCIAL WORK MATERNAL/CHILD NOTE  Patient Details  Name: Girl Aaryanna Hyden MRN: [id number] Date of Birth: 04/14/2020  Date:  04/15/2021  Clinical Social Worker Initiating Note:  Kathrin Greathouse, [place] Date/Time: Initiated:  04/15/21/1415     Child's Name:  Dan Europe   Biological Parents:  Mother, Father (FOB: Almeta Monas)   Need for Interpreter:  None   Reason for Referral:  Behavioral Health Concerns, Current Substance Use/Substance Use During Pregnancy     Address:  [place] [address]    Phone number:  [phone] (home)     Additional phone number:   Household Members/Support Persons (HM/SP):   Household Member/Support Person 1   HM/SP Name Relationship DOB or Age  HM/SP -1 Almeta Monas Spouse 08-16-1986  HM/SP -2        HM/SP -3        HM/SP -4        HM/SP -5        HM/SP -6        HM/SP -7        HM/SP -8          Natural Supports (not living in the home):  Extended Family, Artist Supports:     Employment: Unemployed   Type of Work:     Education:  [place] arranged:    Museum/gallery curator Resources:  Medicaid   Other Resources:  Physicist, medical  , [place] Considerations Which May Impact Care:    Strengths:  Ability to meet basic needs  , Home prepared for child  , Psychotropic Medications   Psychotropic Medications:  Lamictal, Other meds (Clonidine)      Pediatrician:       Pediatrician List:   [place]      Pediatrician Fax Number:    Risk Factors/Current Problems:  Mental Health Concerns  , Other (Comment) (Suboxone)   Cognitive State:  Able to Concentrate  , Alert  , Linear Thinking  , Insightful     Mood/Affect:  Relaxed  , Comfortable  , Calm     CSW Assessment: CSW received consult for hx of Bipolar, depression, anxiety, THC use during pregnancy and suboxone use.   CSW met with MOB to offer support and complete assessment.    CSW met with MOB at bedside. CSW observed MOB holding and bonding with infant.  MOB appeared to be moaning in pain. MOB explained, "I am good in pain because my husband just helped me back into bed. I have lupus so I am always having pain." CSW offered to get the nurse. MOB declined and welcomed CSW visit to stay. CSW introduced role. MOB presented pleasant, calm and receptive to converse. CSW confirmed the demographic information on file is correct. CSW inquired about MOB household. MOB reports it's just her and FOB. MOB report her ten-year-old daughter lives with her sister in [place]. MOB disclosed, "it's nothing with custody, she just likes living with her aunt in [place]." [place] inquired how MOB has felt since giving birth. CSW express, feeling fine, her birthing experience was not all that great but baby is here now and that is her current focus. CSW inquired how MOB felt emotionally during the pregnancy. MOB disclosed, the pregnancy happens at such a stressful time, but things have resolved. I was sick  in and out to the hospital because I also have other health conditions." MOB shared, "I got married and had a baby in the same week." CSW congratulated MOB offered praise. MOB disclosed, now that she is married, she has someone to share her challenges with. CSW inquired about MOB history of Bipolar, depression and anxiety. MOB reports she was diagnosed with manic depression in 2016, now Bipolar I. MOB reports she has been taking Lamictal and Clonidine for her symptoms. MOB reports she also takes suboxone to help with her pain management. MOB reports she was taking several narcotics for pain from her Lupus and she did not like the side effects. MOB reports at the beginning of this pregnancy she was prescribed suboxone by Dr. Nehemiah Settle at [facility] in [place]. MOB disclosed, "the suboxone has helped greatly."  MOB plan is to  continue suboxone treatment postpartum.  CSW inquired if MOB has received therapy. MOB reports she has not received therapy. MOB reports she is open trying therapy for additional support. CSW asked if MOB had experienced postpartum depression. MOB disclosed, I am not sure, I was angry with my daughter's father because he left, so I think I had the right to angry." CSW validated MOB feelings. CSW provided education regarding the baby blues period vs. perinatal mood disorders, discussed treatment and gave resources for mental health follow up if concerns arise.  CSW recommended MOB complete self-evaluation during the postpartum time period using the New Mom Checklist from Postpartum Progress and encouraged MOB to contact a medical professional if symptoms are noted at any time.  MOB reports she feels comfortable reaching out to her physician if concerns arise. CSW assessed MOB for safety. MOB denies thoughts of harm to self and others. MOB denies domestic violence CSW asked MOB about her supports. MOB acknowledges her spouse, mother, sister as supports.   CSW inquired if MOB used THC during her pregnancy. MOB disclosed, "I accidently used THC, because my uncle gave me honey, an edible with THC and I did not know."  MOB reports she used tobacco products during pregnancy which she tried to quit using. MOB denies using any other substance. CSW informed MOB about the hospital drug screen policy. MOB had no questions.  CSW inquired if MOB had CPS history. MOB denies CPS history.   CSW provided review of Sudden Infant Death Syndrome (SIDS) precautions and informed MOB no co-sleeping with the infant. MOB reports the infant will sleep in crib. CSW inquired if MOB has essential items for the infant. MOB reports she has essential items. MOB reports she receives WIC/FS services. MOB is still in the process of choosing a pediatrician. MOB reports she may need assistance to her appointments. MOB reports she will notify CSW, in  assistance is needed. CSW assessed MOB for additional needs. MOB inquired about information for rent assistance. CSW will follow up with resources.   CSW will follow infant's UDS and CDS and make a report to CPS if warranted.  CSW identifies no further need for intervention and no barriers to discharge at this time.   CSW Plan/Description:  Sudden Infant Death Syndrome (SIDS) Education, Other Information/Referral to [employer], CSW Will Continue to Monitor Umbilical Cord Tissue Drug Screen Results and Make Report if Warranted, [place], No Further Intervention Required/No Barriers to Discharge, Perinatal Mood and Anxiety Disorder (PMADs) Education    Lia Hopping, LCSW 04/15/2021, 5:18 PM

## 2021-04-15 NOTE — Progress Notes (Signed)
Pt stated she was 8/10 pain.  RN informed pt she would pull medication for her.  RN immediately went to get medication.  Pt was asleep when RN return with medication.  RN informed pt spouse to have her call out for meds when awake.

## 2021-04-15 NOTE — Progress Notes (Signed)
PCA discontinued per order

## 2021-04-15 NOTE — Progress Notes (Signed)
Pt declined fasting blood sugar. Pt states that she is positive that she does not have diabetes. Pt educated on the benefits of checking a current blood sugar. Pt is aware and would not like her blood sugar checked at this time.  

## 2021-04-15 NOTE — Progress Notes (Signed)
Subjective: POD#1 elective rLTCS  Patient is doing well without complaints. Ambulating without difficulty. Voiding and passing flatus. Tolerating PO. Abdominal pain improved. Vaginal bleeding decreased.  Objective: Vital signs in last 24 hours: Temp:  [97.8 F (36.6 C)-98.7 F (37.1 C)] 98 F (36.7 C) (07/14 0450) Pulse Rate:  [75-84] 77 (07/13 1759) Resp:  [12-32] 21 (07/14 0714) BP: (115-151)/(66-95) 137/80 (07/14 0450) SpO2:  [15 %-100 %] 100 % (07/14 0714) FiO2 (%):  [0 %] 0 % (07/13 1759)  Physical Exam:  General: alert, cooperative, and no distress Lochia: appropriate Uterine Fundus: firm Incision: dressing c/d/i DVT Evaluation: No evidence of DVT seen on physical exam.  Recent Labs    04/12/21 1123 04/15/21 0641  HGB 12.2 10.7*  HCT 35.8* 32.1*    Assessment/Plan: POD#1 rLTCS  -doing well, meeting pp milestones  -hgb stable, 10.7  -VSS except elevated BP  -unsure on contraception, counseled  -will transition off PCA pump to PO today  #cHTN  -previously on labetalol 400 mg BID  -transitioned to procardia xl 30mg  daily  #Lupus  -received stress dose steroids post op  -pred and plaquenil  -previously managed by FM, needs rheum referral prior to delivery  #Bipolar   -lamictal/clonidine  #Opioid use disorder  -suboxone 6mg  BID  #Tobacco abuse  -nicotine patch  Plan for discharge POD#1 or POD#2.  04/15/2021, 8:07 AM

## 2021-04-15 NOTE — Progress Notes (Signed)
Pt called out requesting to "go to gift shop." Staff spoke with pt about her pain 8/10 discouraging leaving the unit.  Pt still insistent to go.  Pt support person pushing pt in wheelchair.  Infant in nursery.

## 2021-04-16 ENCOUNTER — Ambulatory Visit: Payer: Self-pay | Admitting: Nurse Practitioner

## 2021-04-16 MED ORDER — OXYCODONE HCL 5 MG PO TABS
10.0000 mg | ORAL_TABLET | ORAL | Status: DC | PRN
  Administered 2021-04-16 – 2021-04-17 (×7): 10 mg via ORAL
  Filled 2021-04-16 (×7): qty 2

## 2021-04-16 MED ORDER — PREDNISONE 20 MG PO TABS
20.0000 mg | ORAL_TABLET | Freq: Every day | ORAL | Status: DC
  Administered 2021-04-17: 20 mg via ORAL
  Filled 2021-04-16 (×3): qty 1

## 2021-04-16 NOTE — Social Work (Signed)
CSW received consult due to score 11 on Edinburgh Depression Screen.  CSW met with MOB on 7/14 and provided education regarding Baby Blues vs PMADs and provided MOB with resources for mental health follow up. CSW encouraged MOB to evaluate her mental health throughout the postpartum period with the use of the New Mom Checklist developed by Postpartum Progress as well as the [place] Postnatal Depression Scale and notify a medical professional if symptoms arise.     Kathrin Greathouse, MSW, LCSW Women's and [place] Worker  [phone] 2021-01-28  2:28 PM

## 2021-04-16 NOTE — Progress Notes (Signed)
Subjective: POD#2 elective rLTCS  Patient is doing well without complaints. Ambulating without difficulty. Voiding and passing flatus. Tolerating PO. Abdominal pain improved. Vaginal bleeding decreased.  Objective: Vital signs in last 24 hours: Temp:  [97.8 F (36.6 C)-98 F (36.7 C)] 97.8 F (36.6 C) (07/15 0543) Pulse Rate:  [70-80] 70 (07/15 0543) Resp:  [17-22] 17 (07/15 0543) BP: (133-145)/(73-87) 145/87 (07/15 0543) SpO2:  [98 %-100 %] 100 % (07/15 0543) FiO2 (%):  [0 %] 0 % (07/14 0919)  Physical Exam:  General: alert, cooperative, and no distress Lochia: appropriate Uterine Fundus: firm Incision: dressing c/d/i DVT Evaluation: No evidence of DVT seen on physical exam.  Recent Labs    04/15/21 0641  HGB 10.7*  HCT 32.1*    Assessment/Plan: POD#2 rLTCS  -doing well, meeting pp milestones  -hgb stable, 10.7  -VSS except elevated BP  -unsure on contraception, counseled  -transitioned to 15mg  PO oxy q4 yesterday, will transition to 10mg  today  #cHTN  -previously on labetalol 400 mg BID  -transitioned to procardia xl 30mg  daily  #Lupus  -received stress dose steroids post op  -pred and plaquenil  -previously managed by FM, needs rheum referral prior to delivery  #Bipolar   -lamictal/clonidine  #Opioid use disorder  -suboxone 6mg  BID  #Tobacco abuse  -nicotine patch  Plan for discharge tomorrow.  04/16/2021, 8:11 AM

## 2021-04-17 ENCOUNTER — Ambulatory Visit: Payer: Self-pay

## 2021-04-17 MED ORDER — NICOTINE POLACRILEX 2 MG MT LOZG: 2.0000 mg | LOZENGE | OROMUCOSAL | 0 refills | Status: DC | PRN

## 2021-04-17 MED ORDER — NICOTINE 7 MG/24HR TD PT24: 7.0000 mg | MEDICATED_PATCH | Freq: Every day | TRANSDERMAL | 6 refills | Status: AC

## 2021-04-17 MED ORDER — NIFEDIPINE ER OSMOTIC RELEASE 30 MG PO TB24
60.0000 mg | ORAL_TABLET | Freq: Every day | ORAL | Status: DC
  Administered 2021-04-17: 60 mg via ORAL
  Filled 2021-04-17: qty 2

## 2021-04-17 MED ORDER — OXYCODONE HCL 5 MG PO TABS: 5.0000 mg | ORAL_TABLET | ORAL | 0 refills | Status: DC | PRN

## 2021-04-17 MED ORDER — NICOTINE POLACRILEX 2 MG MT GUM: 2.0000 mg | CHEWING_GUM | OROMUCOSAL | 2 refills | Status: AC | PRN

## 2021-04-17 MED ORDER — COCONUT OIL OIL: 1.0000 "application " | TOPICAL_OIL | 0 refills | Status: AC | PRN

## 2021-04-17 MED ORDER — NIFEDIPINE ER 60 MG PO TB24: 60.0000 mg | ORAL_TABLET | Freq: Every day | ORAL | 0 refills | Status: DC

## 2021-04-17 MED ORDER — ACETAMINOPHEN 500 MG PO TABS: 1000.0000 mg | ORAL_TABLET | Freq: Four times a day (QID) | ORAL | 0 refills | Status: DC

## 2021-04-17 NOTE — Discharge Instructions (Signed)
Remove honey dressing 5 days post op (04/19/21)

## 2021-04-17 NOTE — Lactation Note (Signed)
This note was copied from a baby's chart. Lactation Consultation Note  Patient Name: Wendy Herman [id number] Date: 04/17/2021 Reason for consult: Follow-up assessment;Mother's request;Infant < 6lbs;Early term 37-38.6wks;Infant weight loss Age:34 days  Infant just feeding of 30 ml of formula 24 cal/oz  1hr prior to visit.   LC reviewed with mother how to reduce calorie loss with given infant < 6 lb including keeping infant STS, hat on all times feeding 8-12x in 24 hr period no more than 3 hrs without an attempt and total feeding under 30 min.   LC alerted RN, Tresa Garter. Mom just began pumping at this visit and will increase volume with next feeding to not tire infant out and try breastfeeding every other feeding.    Mom to call RN or LC to observe next feeding.   Mom pumping with DEBP q 3 hrs for 15 min fitted with 21 flange stated comfortable fit.    Maternal Data    Feeding Mother's Current Feeding Choice: Breast Milk and Formula Nipple Type: Slow - flow  LATCH Score Latch:  (RN encouraged to call RN to observe next latch)                  Lactation Tools Discussed/Used Tools: Pump;Flanges Flange Size: 21 Breast pump type: Double-Electric Breast Pump Pump Education: Setup, frequency, and cleaning;Milk Storage Reason for Pumping: increase stimul;ation Pumping frequency: every 3 hrs for 15 min  Interventions Interventions: Breast feeding basics reviewed;Education;DEBP  Discharge WIC Program: Yes  Consult Status Consult Status: Follow-up Date: 04/18/21 Follow-up type: In-patient    Wendy Herman  Nicholson-Springer 04/17/2021, 8:59 PM

## 2021-04-18 ENCOUNTER — Ambulatory Visit: Payer: Self-pay

## 2021-04-18 NOTE — Lactation Note (Signed)
This note was copied from a baby's chart. Lactation Consultation Note  Patient Name: Wendy Herman [id number] Date: 04/18/2021 Reason for consult: Follow-up assessment;Infant < 6lbs;Early term 37-38.6wks Age:34 days  Mom takes 20 mg Prednisone daily for her Lupus.  This is a L2 category with is probably compatible, limited data.  Infant needs to be monitored for feeding, growth, and weight gain.  LC in to visit with P3 Mom of ET infant.  Baby gained 20 gms from yesterday.  Baby is exclusively bottle feeding EBM+/24 cal formula.  Offered to assist with trying to latch.  Baby placed STS on football hold.  Mom has a nice flow of milk easily expressed.  Baby unable to sustain a deep latch, and she became so fussy, she wouldn't try.  Bottle fed 5 ml of colostrum and baby settled down to try to latch again.  Mom needing guidance on breast support and support of baby's head to facilitate a deep latch to breast.  Baby latched onto nipple and took a couple sucks.  Placed baby STS on Mom's chest.  Mom instructed to increase her frequency of pumping to with every feeding or >8 times per 24 hrs.  Mom stated she didn't think she had to pump that often.  She thought she needed to pump occasionally between feedings.  Explained importance of consistent pumping due to baby being <5 lbs and unable to sustain a deep latch and feed well at the breast now.  Mom aware of OP lactation support available to her.  Mom encouraged to ask for referral to OP LC on baby's discharge.  Feeding Nipple Type: Slow - flow  LATCH Score Latch: Repeated attempts needed to sustain latch, nipple held in mouth throughout feeding, stimulation needed to elicit sucking reflex.  Audible Swallowing: None  Type of Nipple: Everted at rest and after stimulation  Comfort (Breast/Nipple): Soft / non-tender  Hold (Positioning): Full assist, staff holds infant at breast  LATCH Score: 5   Lactation Tools Discussed/Used Tools:  Pump;Flanges;Bottle Flange Size: 21 Breast pump type: Double-Electric Breast Pump Pumping frequency: 3-4 times a day Pumped volume: 5 mL  Interventions Interventions: Breast feeding basics reviewed;Assisted with latch;Skin to skin;Breast massage;Hand express;Support pillows;Adjust position;Breast compression;Position options;Expressed milk;DEBP;Education   Consult Status Consult Status: Follow-up Date: 04/19/21 Follow-up type: In-patient    Wendy Herman 04/18/2021, 6:33 PM

## 2021-04-19 ENCOUNTER — Other Ambulatory Visit: Payer: Self-pay

## 2021-04-19 ENCOUNTER — Telehealth: Payer: Self-pay

## 2021-04-19 NOTE — Telephone Encounter (Addendum)
Transition Care Management Unsuccessful Follow-up Telephone Call  Date of discharge and from where:  04/17/2021-[facility]  Attempts:  1st Attempt  Reason for unsuccessful TCM follow-up call:  Unable to leave message-patient was unavailable to talk

## 2021-04-20 MED ORDER — BUPRENORPHINE HCL-NALOXONE HCL 2-0.5 MG SL FILM: 3.0000 | ORAL_FILM | Freq: Two times a day (BID) | SUBLINGUAL | 0 refills | Status: DC

## 2021-04-20 NOTE — Telephone Encounter (Signed)
Transition Care Management Unsuccessful Follow-up Telephone Call  Date of discharge and from where:  04/17/2021-[facility]   Attempts:  2nd Attempt  Reason for unsuccessful TCM follow-up call:  Left voice message

## 2021-04-20 NOTE — Telephone Encounter (Signed)
refilled 

## 2021-04-20 NOTE — Telephone Encounter (Signed)
-----   Message from Wendy Herman, [place] sent at 04/20/2021  9:23 AM EDT ----- Regarding: Rx refill Dr. Adrian Blackwater, this pt needs a refill of Suboxone.

## 2021-04-21 NOTE — Telephone Encounter (Signed)
Transition Care Management Unsuccessful Follow-up Telephone Call  Date of discharge and from where:  04/17/2021-[facility]   Attempts:  3rd Attempt  Reason for unsuccessful TCM follow-up call:  Left voice message

## 2021-04-22 ENCOUNTER — Encounter: Payer: Self-pay | Admitting: Family Medicine

## 2021-04-22 ENCOUNTER — Ambulatory Visit (INDEPENDENT_AMBULATORY_CARE_PROVIDER_SITE_OTHER): Payer: Medicaid Other | Admitting: Family Medicine

## 2021-04-22 ENCOUNTER — Other Ambulatory Visit: Payer: Self-pay

## 2021-04-22 VITALS — BP 166/93 | HR 85 | Wt 158.8 lb

## 2021-04-22 DIAGNOSIS — O99891 Other specified diseases and conditions complicating pregnancy: Secondary | ICD-10-CM

## 2021-04-22 DIAGNOSIS — O99345 Other mental disorders complicating the puerperium: Secondary | ICD-10-CM

## 2021-04-22 DIAGNOSIS — Z4889 Encounter for other specified surgical aftercare: Secondary | ICD-10-CM

## 2021-04-22 DIAGNOSIS — I10 Essential (primary) hypertension: Secondary | ICD-10-CM

## 2021-04-22 DIAGNOSIS — F1120 Opioid dependence, uncomplicated: Secondary | ICD-10-CM

## 2021-04-22 DIAGNOSIS — M329 Systemic lupus erythematosus, unspecified: Secondary | ICD-10-CM

## 2021-04-22 DIAGNOSIS — F53 Postpartum depression: Secondary | ICD-10-CM

## 2021-04-22 DIAGNOSIS — O99320 Drug use complicating pregnancy, unspecified trimester: Secondary | ICD-10-CM

## 2021-04-22 MED ORDER — LABETALOL HCL 200 MG PO TABS
400.0000 mg | ORAL_TABLET | Freq: Two times a day (BID) | ORAL | 3 refills | Status: DC
Start: 2021-04-22 — End: 2021-07-26

## 2021-04-22 NOTE — Progress Notes (Signed)
Blood Pressure check

## 2021-04-22 NOTE — Progress Notes (Signed)
   Subjective:    Patient ID: Wendy Herman, female    DOB: 11-Dec-1985, 35 y.o.   MRN: [id number]  HPI  Patient seen for wound check and blood pressure check.  Patient had a wound VAC on.  She is 8 days status post cesarean section.  She reports minimal bleeding and pain fairly well controlled.  She is currently just using the Suboxone and Tylenol for pain control.  Her blood pressure is elevated.  She reports no headache, blurred vision, abdominal pain.  She does have a lot of stress and anxiety.  She is currently staying with her mom for help.  This does increase her anxiety a lot.  Review of Systems    BP (!) 166/93   Pulse 85   Wt 158 lb 12.8 oz (72 kg)   BMI 28.13 kg/m   Objective:   Physical Exam Vitals and nursing note reviewed.  Constitutional:      Appearance: Normal appearance.  Cardiovascular:     Rate and Rhythm: Normal rate.     Pulses: Normal pulses.  Pulmonary:     Effort: Pulmonary effort is normal.     Breath sounds: Normal breath sounds.  Abdominal:     General: Abdomen is flat.     Palpations: Abdomen is soft.  Skin:    General: Skin is warm and dry.     Capillary Refill: Capillary refill takes less than 2 seconds.  Neurological:     General: No focal deficit present.     Mental Status: She is alert.  Psychiatric:        Mood and Affect: Mood normal.        Behavior: Behavior normal.        Thought Content: Thought content normal.       Assessment & Plan:   1. Encounter for post surgical wound check Appears to be healing well  2. Postpartum depression - Ambulatory referral to Integrated Behavioral Health  3. Suboxone maintenance treatment complicating pregnancy, antepartum, unspecified trimester (HCC) Continue suboxone  4. Systemic lupus erythematosus affecting pregnancy (HCC) Controlled  5. Chronic hypertension Continue nifedipine. Will add labetalol back in. Patient to check BPs at home and send message.

## 2021-04-23 ENCOUNTER — Telehealth: Payer: Self-pay | Admitting: Family Medicine

## 2021-04-23 NOTE — Telephone Encounter (Signed)
Attempted to reach patient to get her scheduled to see Sue Lush for Postpartum depression. Left a message for her to call the office for an appointment.

## 2021-04-26 ENCOUNTER — Telehealth (HOSPITAL_COMMUNITY): Payer: Self-pay | Admitting: *Deleted

## 2021-04-26 NOTE — Telephone Encounter (Signed)
Hospital Discharge Follow-Up Call:  Patient reports she is doing well and has no concerns about her healing.  She is very interested in connecting with other new mothers.  I will send support group information.  She also had questions about out-patient lactation support, but she lives in [place] and coming to [place] for an appointment may not be possible.  She endorses being enrolled in the [facility] program in [place].  I gave her the number for the [facility] Message Line as well as encouraged her to call her local [facility] office and ask about  LCs and/or peer counselors there.  EPDS score today was 8.  Patient reports baby is doing well and she has no concerns about the baby's health at this time.

## 2021-04-28 ENCOUNTER — Other Ambulatory Visit: Payer: Self-pay

## 2021-04-28 ENCOUNTER — Encounter: Payer: Medicaid Other | Admitting: Licensed Clinical Social Worker

## 2021-04-28 ENCOUNTER — Ambulatory Visit (INDEPENDENT_AMBULATORY_CARE_PROVIDER_SITE_OTHER): Payer: Medicaid Other | Admitting: Licensed Clinical Social Worker

## 2021-04-28 DIAGNOSIS — F17200 Nicotine dependence, unspecified, uncomplicated: Secondary | ICD-10-CM

## 2021-04-28 DIAGNOSIS — O99345 Other mental disorders complicating the puerperium: Secondary | ICD-10-CM

## 2021-04-28 DIAGNOSIS — F53 Postpartum depression: Secondary | ICD-10-CM | POA: Diagnosis not present

## 2021-04-28 NOTE — BH Specialist Note (Signed)
Integrated Behavioral Health via Telemedicine Visit  04/28/2021 KRYSTYNE TEWKSBURY [id number]  Number of Integrated Behavioral Health visits: 1 Session Start time: 10:00am  Session End time: 10:50am Total time: 50  via phone per pt request   Referring Provider: Manus Rudd Patient/Family location: Home [facility] Provider location: [facility] All persons participating in visit: Pt. R Pichette and LCSWA A. Felton Clinton Types of Service: General Behavioral Integrated Care (BHI)  I connected with Brain Hilts and/or Mickel Duhamel Daleo's n/a via  Telephone or Engineer, civil (consulting)  (Video is Caregility application) and verified that I am speaking with the correct person using two identifiers. Discussed confidentiality: Yes   I discussed the limitations of telemedicine and the availability of in person appointments.  Discussed there is a possibility of technology failure and discussed alternative modes of communication if that failure occurs.  I discussed that engaging in this telemedicine visit, they consent to the provision of behavioral healthcare and the services will be billed under their insurance.  Patient and/or legal guardian expressed understanding and consented to Telemedicine visit: Yes   Presenting Concerns: Patient and/or family reports the following symptoms/concerns: History of bipolar disorder, tobacco disorder, adjustment disorder Duration of problem: over one year ; Severity of problem: mild  Patient and/or Family's Strengths/Protective Factors: Concrete supports in place (healthy food, safe environments, etc.)  Goals Addressed: Patient will:  Reduce symptoms of: anxiety, mood instability, and stress   Increase knowledge and/or ability of: coping skills, healthy habits, self-management skills, and stress reduction   Demonstrate ability to: Increase motivation to adhere to plan of care and Decrease self-medicating behaviors  Progress towards  Goals: Ongoing  Interventions: Interventions utilized:  Supportive Counseling Standardized Assessments completed: Not Needed  Patient and/or Family Response: Ms. Lightcap reports limited support with parenting. Ms. Zellman reports increase fatigue, irritability, tobacco use and crying.    Assessment: Patient currently experiencing postpartum depression.   Patient may benefit from warm referral to outpatient behavioral health.  Plan: Follow up with behavioral health clinician on : 2 weeks  Behavioral recommendations: keep all medical appts, prioritze rest, allow family to help with feeding newborn, schedule self care Referral(s): Community Mental Health Services (LME/Outside Clinic)  I discussed the assessment and treatment plan with the patient and/or parent/guardian. They were provided an opportunity to ask questions and all were answered. They agreed with the plan and demonstrated an understanding of the instructions.   They were advised to call back or seek an in-person evaluation if the symptoms worsen or if the condition fails to improve as anticipated.  Gwyndolyn Saxon, LCSW

## 2021-04-29 ENCOUNTER — Telehealth: Payer: Self-pay

## 2021-04-29 ENCOUNTER — Other Ambulatory Visit: Payer: Self-pay | Admitting: Family Medicine

## 2021-04-29 MED ORDER — CLOTRIMAZOLE 1 % EX CREA: 1.0000 "application " | TOPICAL_CREAM | Freq: Two times a day (BID) | CUTANEOUS | 1 refills | Status: DC

## 2021-04-29 NOTE — Telephone Encounter (Signed)
Patient has a two week old who has thrush. She is requesting something be called into her pharmacy to clear up symptoms. Patient does not report any abnormal discharge.

## 2021-05-05 ENCOUNTER — Telehealth: Payer: Self-pay

## 2021-05-05 ENCOUNTER — Other Ambulatory Visit: Payer: Self-pay

## 2021-05-05 NOTE — Telephone Encounter (Signed)
-----   Message from Marti Sleigh, [place] sent at 05/05/2021  3:56 PM EDT ----- Has questions, please call [phone] or if that number doesn't work call [phone].  Please let her know that [facility] is [place] has a BP cuff waiting for her if she hasn't picked it up already.

## 2021-05-05 NOTE — Telephone Encounter (Signed)
Patient called stating that she need suboxone refill and her and Dr. Adrian Blackwater talked about going up on her dose. She thought about it more and she states she is starting to have more symptoms, so she would like to go up on the dosage.  Will route to provider for review. [name] RN

## 2021-05-07 ENCOUNTER — Encounter (HOSPITAL_COMMUNITY): Payer: Self-pay | Admitting: Obstetrics and Gynecology

## 2021-05-07 MED ORDER — BUPRENORPHINE HCL-NALOXONE HCL 2-0.5 MG SL FILM: 3.0000 | ORAL_FILM | Freq: Two times a day (BID) | SUBLINGUAL | 0 refills | Status: DC

## 2021-05-10 MED ORDER — BUPRENORPHINE HCL-NALOXONE HCL 8-2 MG SL FILM: 2.0000 | ORAL_FILM | Freq: Every day | SUBLINGUAL | 0 refills | Status: DC

## 2021-05-10 NOTE — Telephone Encounter (Signed)
Discussed with patient - having some withdrawal symptoms in AM - chills, nausea. Has to immediately take dose.

## 2021-05-10 NOTE — Telephone Encounter (Signed)
-----   Message from Mikey Bussing, [place] sent at 05/10/2021  1:13 PM EDT ----- Regarding: Suboxone refill This pt would like a dosage increase and a refill of Suboxone.

## 2021-05-12 NOTE — Telephone Encounter (Signed)
I already discussed this with her and have adjusted her dose to 16mg  Daily.

## 2021-05-19 ENCOUNTER — Ambulatory Visit: Payer: Medicaid Other | Admitting: Family Medicine

## 2021-05-21 MED ORDER — NORETHINDRONE 0.35 MG PO TABS: 1.0000 | ORAL_TABLET | Freq: Every day | ORAL | 3 refills | Status: AC

## 2021-05-21 MED ORDER — BUPRENORPHINE HCL-NALOXONE HCL 2-0.5 MG SL FILM: 3.0000 | ORAL_FILM | Freq: Three times a day (TID) | SUBLINGUAL | 0 refills | Status: DC

## 2021-05-28 ENCOUNTER — Other Ambulatory Visit: Payer: Self-pay | Admitting: Family Medicine

## 2021-06-03 ENCOUNTER — Other Ambulatory Visit: Payer: Self-pay | Admitting: Family Medicine

## 2021-06-03 MED ORDER — POLYETHYLENE GLYCOL 3350 17 GM/SCOOP PO POWD: 17.0000 g | Freq: Every day | ORAL | 3 refills | Status: AC

## 2021-06-07 ENCOUNTER — Other Ambulatory Visit: Payer: Self-pay

## 2021-06-08 MED ORDER — BUPRENORPHINE HCL-NALOXONE HCL 2-0.5 MG SL FILM: 3.0000 | ORAL_FILM | Freq: Three times a day (TID) | SUBLINGUAL | 0 refills | Status: DC

## 2021-06-18 ENCOUNTER — Telehealth: Payer: Self-pay

## 2021-06-18 MED ORDER — BUPRENORPHINE HCL-NALOXONE HCL 8-2 MG SL FILM: 1.0000 | ORAL_FILM | Freq: Two times a day (BID) | SUBLINGUAL | 0 refills | Status: DC

## 2021-06-18 NOTE — Addendum Note (Signed)
Addended by: Levie Heritage on: 06/18/2021 11:34 AM   Modules accepted: Orders

## 2021-06-18 NOTE — Telephone Encounter (Signed)
Prior authorization for patients suboxone film approved per Maralyn Sago at [employer] On phone with insurance for approval  [phone] Coverage approved through 06-18-22. [name] RN

## 2021-06-18 NOTE — Telephone Encounter (Signed)
PA contact for patient insurance company called back. Patient PA I got this morning for suboxone was only for three days at a time (#27) and the pharmacy had already dispense it.  Trying to trouble shoot getting the rest of the prescription dispensed (#72 more).  Prior authorization line states we can only get three days at a time. So script will have to be written for #27 every three days.  It is the limitation on the plan.  On phone with prior authorization line for 42 minutes.  [name] RN

## 2021-06-23 ENCOUNTER — Ambulatory Visit: Payer: Medicaid Other | Admitting: Family Medicine

## 2021-06-30 ENCOUNTER — Encounter (HOSPITAL_COMMUNITY): Payer: Self-pay

## 2021-06-30 ENCOUNTER — Emergency Department (HOSPITAL_COMMUNITY)
Admission: EM | Admit: 2021-06-30 | Discharge: 2021-06-30 | Disposition: A | Discharge status: Home / Self Care | Payer: Medicaid Other | Attending: Emergency Medicine | Admitting: Emergency Medicine

## 2021-06-30 ENCOUNTER — Ambulatory Visit: Payer: Medicaid Other | Admitting: Family Medicine

## 2021-06-30 DIAGNOSIS — R0602 Shortness of breath: Secondary | ICD-10-CM | POA: Diagnosis not present

## 2021-06-30 DIAGNOSIS — N9489 Other specified conditions associated with female genital organs and menstrual cycle: Secondary | ICD-10-CM | POA: Diagnosis not present

## 2021-06-30 DIAGNOSIS — Z20822 Contact with and (suspected) exposure to covid-19: Secondary | ICD-10-CM | POA: Diagnosis not present

## 2021-06-30 DIAGNOSIS — R319 Hematuria, unspecified: Secondary | ICD-10-CM | POA: Insufficient documentation

## 2021-06-30 DIAGNOSIS — R0981 Nasal congestion: Secondary | ICD-10-CM | POA: Insufficient documentation

## 2021-06-30 DIAGNOSIS — Z5321 Procedure and treatment not carried out due to patient leaving prior to being seen by health care provider: Secondary | ICD-10-CM | POA: Diagnosis not present

## 2021-06-30 DIAGNOSIS — R9431 Abnormal electrocardiogram [ECG] [EKG]: Secondary | ICD-10-CM | POA: Diagnosis not present

## 2021-06-30 LAB — BASIC METABOLIC PANEL
Anion gap: 8 (ref 5–15)
BUN: 14 mg/dL (ref 6–20)
CO2: 26 mmol/L (ref 22–32)
Calcium: 9.1 mg/dL (ref 8.9–10.3)
Chloride: 100 mmol/L (ref 98–111)
Creatinine, Ser: 0.9 mg/dL (ref 0.44–1.00)
GFR, Estimated: >60 mL/min (ref >60)
Glucose, Bld: 138 mg/dL — ABNORMAL HIGH (ref 70–99)
Potassium: 4 mmol/L (ref 3.5–5.1)
Sodium: 134 mmol/L — ABNORMAL LOW (ref 135–145)

## 2021-06-30 LAB — CBC WITH DIFFERENTIAL/PLATELET
Abs Immature Granulocytes: 0.05 10*3/uL (ref 0.00–0.07)
Basophils Absolute: 0 10*3/uL (ref 0.0–0.1)
Basophils Relative: 0 %
Eosinophils Absolute: 0 10*3/uL (ref 0.0–0.5)
Eosinophils Relative: 0 %
HCT: 41.1 % (ref 36.0–46.0)
Hemoglobin: 13.1 g/dL (ref 12.0–15.0)
Immature Granulocytes: 1 %
Lymphocytes Relative: 8 %
Lymphs Abs: 0.8 10*3/uL (ref 0.7–4.0)
MCH: 29.4 pg (ref 26.0–34.0)
MCHC: 31.9 g/dL (ref 30.0–36.0)
MCV: 92.2 fL (ref 80.0–100.0)
Monocytes Absolute: 0.3 10*3/uL (ref 0.1–1.0)
Monocytes Relative: 3 %
Neutro Abs: 8.6 10*3/uL — ABNORMAL HIGH (ref 1.7–7.7)
Neutrophils Relative %: 88 %
Platelets: 230 10*3/uL (ref 150–400)
RBC: 4.46 MIL/uL (ref 3.87–5.11)
RDW: 14.5 % (ref 11.5–15.5)
WBC: 9.8 10*3/uL (ref 4.0–10.5)
nRBC: 0 % (ref 0.0–0.2)

## 2021-06-30 LAB — RESP PANEL BY RT-PCR (FLU A&B, COVID) ARPGX2
Influenza A by PCR: NEGATIVE
Influenza B by PCR: NEGATIVE
SARS Coronavirus 2 by RT PCR: NEGATIVE

## 2021-06-30 LAB — URINALYSIS, ROUTINE W REFLEX MICROSCOPIC
Bilirubin Urine: NEGATIVE
Glucose, UA: NEGATIVE mg/dL
Hgb urine dipstick: NEGATIVE
Ketones, ur: NEGATIVE mg/dL
Leukocytes,Ua: NEGATIVE
Nitrite: POSITIVE — AB
Protein, ur: NEGATIVE mg/dL
Specific Gravity, Urine: 1.02 (ref 1.005–1.030)
pH: 6 (ref 5.0–8.0)

## 2021-06-30 LAB — I-STAT BETA HCG BLOOD, ED (MC, WL, AP ONLY): I-stat hCG, quantitative: <5 m[IU]/mL (ref <5)

## 2021-06-30 LAB — LACTIC ACID, PLASMA: Lactic Acid, Venous: 1.7 mmol/L (ref 0.5–1.9)

## 2021-06-30 MED ORDER — ACETAMINOPHEN 325 MG PO TABS: 650.0000 mg | ORAL_TABLET | Freq: Once | ORAL | Status: DC

## 2021-06-30 NOTE — ED Triage Notes (Signed)
Pt reports that she has been having congestion and SOB for the past week, took home covid test that was negative. Pt also reports lupus flare up and sores in her mouth. Pt also c/o of hematuria as well.

## 2021-06-30 NOTE — ED Provider Notes (Signed)
Emergency Medicine Provider Triage Evaluation Note  Wendy Herman , a 35 y.o. female  was evaluated in triage.  Pt complains of flulike symptoms for the past week.  She complains of cough, shortness of breath, body aches, gesturing for the past week.  She reports her daughter is at home and sick as well.  She took an at home COVID test which was negative.  She states she began feeling more short of breath today prompting ED visit.  She was unaware she had a fever until she came in.  Febrile at 101.3. Apparently she was hypoxic on arrival and has been placed on 2 L however there has been no documentation of same.  She is currently on 2 L and satting 93%.  She is not typically on O2.   Also complains of 1 episode of hematuria that she noticed last week.  She is unsure if she has a UTI however still having any dysuria urinary frequency.  Complains of a sore to her mouth.  She has a history of lupus and states whenever she gets sick she will begin having sores in her mouth that she feels like her immune system is down.  Review of Systems  Positive: + fevers, chills, fatigue, cough, SOB, body aches, hematuria Negative: - abdominal pain, nausea, vomiting  Physical Exam  BP 122/79 (BP Location: Left Arm)   Pulse 99   Temp (!) 101.3 F (38.5 C) (Oral)   Resp (!) 22   SpO2 93%  Gen:   Awake, no distress   Resp:  Normal effort  MSK:   Moves extremities without difficulty  Other:  On 2L [place] currently. Actively coughing. LCTAB.  Medical Decision Making  Medically screening exam initiated at 8:25 PM.  Appropriate orders placed.  Wendy Herman was informed that the remainder of the evaluation will be completed by another provider, this initial triage assessment does not replace that evaluation, and the importance of remaining in the ED until their evaluation is complete.     Tanda Rockers, PA-C 06/30/21 2027    Bethann Berkshire, MD 06/30/21 2138

## 2021-06-30 NOTE — ED Notes (Signed)
Patient Left without being seen due to having another family emergency. Patient signed MSE form and was aware of the risk of leaving without being seen.

## 2021-07-01 ENCOUNTER — Other Ambulatory Visit: Payer: Self-pay

## 2021-07-02 ENCOUNTER — Other Ambulatory Visit: Payer: Self-pay | Admitting: Family Medicine

## 2021-07-02 MED ORDER — BUPRENORPHINE HCL-NALOXONE HCL 8-2 MG SL FILM: 1.0000 | ORAL_FILM | Freq: Two times a day (BID) | SUBLINGUAL | 0 refills | Status: DC

## 2021-07-06 MED ORDER — CEFADROXIL 500 MG PO CAPS: 1000.0000 mg | ORAL_CAPSULE | Freq: Every day | ORAL | 0 refills | Status: AC

## 2021-07-09 ENCOUNTER — Ambulatory Visit: Payer: Medicaid Other | Admitting: Family Medicine

## 2021-07-13 ENCOUNTER — Telehealth: Payer: Self-pay

## 2021-07-13 NOTE — Telephone Encounter (Signed)
The patients pharmacy called stating that the patient called stating she should have a refill of her old prescription strength. Pharmacy made aware that Dr. Adrian Blackwater is not in the office this week so we have not recently instructed her of this. Pharmacy states understanding. [name] RN

## 2021-07-15 ENCOUNTER — Other Ambulatory Visit: Payer: Self-pay

## 2021-07-15 MED ORDER — BUPRENORPHINE HCL-NALOXONE HCL 8-2 MG SL FILM: 1.0000 | ORAL_FILM | Freq: Two times a day (BID) | SUBLINGUAL | 0 refills | Status: DC

## 2021-07-22 ENCOUNTER — Ambulatory Visit: Payer: Medicaid Other | Admitting: Family Medicine

## 2021-07-26 MED ORDER — LABETALOL HCL 200 MG PO TABS: 400.0000 mg | ORAL_TABLET | Freq: Two times a day (BID) | ORAL | 3 refills | Status: DC

## 2021-07-26 MED ORDER — NIFEDIPINE ER 60 MG PO TB24: 60.0000 mg | ORAL_TABLET | Freq: Every day | ORAL | 0 refills | Status: AC

## 2021-07-27 MED ORDER — LABETALOL HCL 200 MG PO TABS: 400.0000 mg | ORAL_TABLET | Freq: Two times a day (BID) | ORAL | 3 refills | Status: AC

## 2021-07-27 NOTE — Addendum Note (Signed)
Addended by: Levie Heritage on: 07/27/2021 04:05 PM   Modules accepted: Orders

## 2021-07-29 ENCOUNTER — Other Ambulatory Visit: Payer: Self-pay

## 2021-07-29 ENCOUNTER — Encounter: Payer: Self-pay | Admitting: Family Medicine

## 2021-07-29 ENCOUNTER — Other Ambulatory Visit (HOSPITAL_COMMUNITY)
Admission: RE | Admit: 2021-07-29 | Discharge: 2021-07-29 | Disposition: A | Discharge status: Home / Self Care | Payer: Medicaid Other | Source: Ambulatory Visit | Attending: Family Medicine | Admitting: Family Medicine

## 2021-07-29 ENCOUNTER — Ambulatory Visit (INDEPENDENT_AMBULATORY_CARE_PROVIDER_SITE_OTHER): Payer: Medicaid Other | Admitting: Family Medicine

## 2021-07-29 VITALS — BP 159/94 | HR 89 | Wt 155.0 lb

## 2021-07-29 DIAGNOSIS — Z30017 Encounter for initial prescription of implantable subdermal contraceptive: Secondary | ICD-10-CM | POA: Diagnosis not present

## 2021-07-29 DIAGNOSIS — R87610 Atypical squamous cells of undetermined significance on cytologic smear of cervix (ASC-US): Secondary | ICD-10-CM

## 2021-07-29 DIAGNOSIS — R87810 Cervical high risk human papillomavirus (HPV) DNA test positive: Secondary | ICD-10-CM | POA: Insufficient documentation

## 2021-07-29 DIAGNOSIS — M329 Systemic lupus erythematosus, unspecified: Secondary | ICD-10-CM

## 2021-07-29 DIAGNOSIS — Z01812 Encounter for preprocedural laboratory examination: Secondary | ICD-10-CM | POA: Diagnosis not present

## 2021-07-29 DIAGNOSIS — O169 Unspecified maternal hypertension, unspecified trimester: Secondary | ICD-10-CM

## 2021-07-29 DIAGNOSIS — O165 Unspecified maternal hypertension, complicating the puerperium: Secondary | ICD-10-CM | POA: Diagnosis not present

## 2021-07-29 DIAGNOSIS — O24430 Gestational diabetes mellitus in the puerperium, diet controlled: Secondary | ICD-10-CM

## 2021-07-29 DIAGNOSIS — O24419 Gestational diabetes mellitus in pregnancy, unspecified control: Secondary | ICD-10-CM

## 2021-07-29 DIAGNOSIS — R87612 Low grade squamous intraepithelial lesion on cytologic smear of cervix (LGSIL): Secondary | ICD-10-CM | POA: Diagnosis not present

## 2021-07-29 LAB — POCT URINE PREGNANCY: Preg Test, Ur: NEGATIVE

## 2021-07-29 MED ORDER — BUPRENORPHINE HCL-NALOXONE HCL 8-2 MG SL FILM: 1.0000 | ORAL_FILM | Freq: Two times a day (BID) | SUBLINGUAL | 0 refills | Status: AC

## 2021-07-29 MED ORDER — ETONOGESTREL 68 MG ~~LOC~~ IMPL
68.0000 mg | DRUG_IMPLANT | Freq: Once | SUBCUTANEOUS | Status: AC
  Administered 2021-07-29: 68 mg via SUBCUTANEOUS

## 2021-07-29 NOTE — Progress Notes (Signed)
Post Partum Visit Note  Wendy Herman is a 35 y.o. [phone] female who presents for a postpartum visit. She is  14  weeks postpartum following a repeat cesarean section.  I have fully reviewed the prenatal and intrapartum course. The delivery was at 37 gestational weeks.  Anesthesia: spinal. Postpartum course has been normal. Baby is doing well normal. Baby is feeding by both breast and bottle - Enfamil NeuroPro . Bleeding red. Bowel function is normal. Bladder function is normal. Patient is sexually active. Contraception method is Nexplanon. Postpartum depression screening: negative.   The pregnancy intention screening data noted above was reviewed. Potential methods of contraception were discussed. The patient elected to proceed with No data recorded.   Edinburgh Postnatal Depression Scale - 07/29/21 1550       Edinburgh Postnatal Depression Scale:  In the Past 7 Days   I have been able to laugh and see the funny side of things. 0    I have looked forward with enjoyment to things. 0    I have blamed myself unnecessarily when things went wrong. 0    I have been anxious or worried for no good reason. 0    I have felt scared or panicky for no good reason. 0    Things have been getting on top of me. 1    I have been so unhappy that I have had difficulty sleeping. 0    I have felt sad or miserable. 0    I have been so unhappy that I have been crying. 0    The thought of harming myself has occurred to me. 0    Edinburgh Postnatal Depression Scale Total 1             Health Maintenance Due  Topic Date Due   COVID-19 Vaccine (1) Never done   Pneumococcal Vaccine 76-73 Years old (1 - PCV) Never done   INFLUENZA VACCINE  Never done    The following portions of the patient's history were reviewed and updated as appropriate: allergies, current medications, past family history, past medical history, past social history, past surgical history, and problem list.  Review of  Systems Pertinent items are noted in HPI.  Objective:  BP (!) 159/94   Pulse 89   Wt 155 lb (70.3 kg)   LMP  (LMP Unknown)   BMI 27.46 kg/m    General:  alert, cooperative, and no distress   Breasts:  not indicated  Lungs: clear to auscultation bilaterally  Heart:  regular rate and rhythm, S1, S2 normal, no murmur, click, rub or gallop  Abdomen: soft, non-tender; bowel sounds normal; no masses,  no organomegaly   Wound well approximated incision  GU exam:  normal       Assessment:   1. Encounter for initial prescription of Nexplanon   2. Postpartum care and examination   3. Gestational diabetes mellitus (GDM) in third trimester, gestational diabetes method of control unspecified   4. Systemic lupus erythematosus affecting pregnancy (HCC)   5. ASCUS with positive high risk HPV cervical   6. Hypertension affecting pregnancy, antepartum      Plan:   Essential components of care per ACOG recommendations:  1.  Mood and well being: Patient with negative depression screening today. Reviewed local resources for support.  - Patient tobacco use? yes - hx of drug use? No.    2. Infant care and feeding:  -Patient currently breastmilk feeding? Yes. Reviewed importance of draining breast regularly  to support lactation.  -Social determinants of health (SDOH) reviewed in EPIC. No concerns  3. Sexuality, contraception and birth spacing - Patient does not want a pregnancy in the next year.   - Reviewed forms of contraception in tiered fashion. Patient desired  nexplanon  today.   - Discussed birth spacing of 18 months  4. Sleep and fatigue -Encouraged family/partner/community support of 4 hrs of uninterrupted sleep to help with mood and fatigue  5. Physical Recovery  - Discussed patients delivery and complications. She describes her labor as good. - Patient had a C-section.  - Patient has urinary incontinence? No. - Patient is safe to resume physical and sexual activity  6.   Health Maintenance - HM due items addressed Yes - Last pap smear  Diagnosis  Date Value Ref Range Status  09/23/2020 (A)  Final   - Atypical squamous cells of undetermined significance (ASC-US)   Pap smear not done at today's visit.  -Breast Cancer screening indicated? No.   7. Chronic Disease/Pregnancy Condition follow up: Hypertension and lupus. Patient to find PCP and suboxone provider.  - PCP follow up  Levie Heritage, DO [facility], [facility]

## 2021-07-29 NOTE — Progress Notes (Signed)
Patient Name: Wendy Herman, female   DOB: 12/02/1985, 36 y.o.  MRN: [id number]  Colposcopy Procedure Note:  [id number] Pregnancy status: Unknown Lab Results  Component Value Date   DIAGPAP (A) 09/23/2020    - Atypical squamous cells of undetermined significance (ASC-US)   HPVHIGH Positive (A) 09/23/2020    Cervical History: Previous Colposcopy: n/a Previous LEEP or Cryo: n/a  Smoking: Never Smoked Hysterectomy: No Other History:   Patient given informed consent, signed copy in the chart, time out was performed.    Exam: Vulva and Vagina grossly normal.  Cervix viewed with speculum and colposcope after application of acetic acid:  Cervix Fully Visualized Squamocolumnar Junction Visibility: Fully visualized  Acetowhite lesions: 6 o'clock  Other Lesions: None Punctation: Not present  Mosaicism: Not present Abnormal vasculature: No   Biopsies: 6 o'clock ECC: Yes - Curette and Brush  Hemostasis achieved with:  Monsel's Solution  Colposcopy Impression:  CIN1   Patient was given post procedure instructions.  Will call patient with results.

## 2021-07-29 NOTE — Addendum Note (Signed)
Addended by: Levie Heritage on: 07/29/2021 05:12 PM   Modules accepted: Orders

## 2021-07-29 NOTE — Patient Instructions (Signed)
Suboxone Provider in [place]: Sherre Lain MD Treatment [facility] [address]  [phone]

## 2021-07-30 ENCOUNTER — Encounter: Payer: Self-pay | Admitting: General Practice

## 2021-08-02 LAB — SURGICAL PATHOLOGY

## 2021-08-30 ENCOUNTER — Ambulatory Visit (INDEPENDENT_AMBULATORY_CARE_PROVIDER_SITE_OTHER): Payer: Medicaid Other

## 2021-08-30 ENCOUNTER — Other Ambulatory Visit: Payer: Self-pay

## 2021-08-30 VITALS — BP 157/93 | HR 92 | Wt 160.0 lb

## 2021-08-30 DIAGNOSIS — R300 Dysuria: Secondary | ICD-10-CM

## 2021-08-30 DIAGNOSIS — R8290 Unspecified abnormal findings in urine: Secondary | ICD-10-CM | POA: Diagnosis not present

## 2021-08-30 LAB — POCT URINALYSIS DIPSTICK
Bilirubin, UA: NEGATIVE
Glucose, UA: NEGATIVE
Ketones, UA: POSITIVE
Nitrite, UA: POSITIVE
Protein, UA: NEGATIVE
Spec Grav, UA: 1.01 (ref 1.010–1.025)
Urobilinogen, UA: 0.2 E.U./dL
pH, UA: 6 (ref 5.0–8.0)

## 2021-08-30 NOTE — Progress Notes (Signed)
Patient having dysuria. Patient states she has noticed some odor to her urine. Patient is allergic to sulfa drugs. Patient has taken cipro in past for UTI and she states this usually works best.  Urine dip reveals evidence of UTI. Urine culture sent. [name] RN

## 2021-09-02 ENCOUNTER — Other Ambulatory Visit: Payer: Self-pay

## 2021-09-02 DIAGNOSIS — B379 Candidiasis, unspecified: Secondary | ICD-10-CM

## 2021-09-02 DIAGNOSIS — N390 Urinary tract infection, site not specified: Secondary | ICD-10-CM

## 2021-09-02 LAB — URINE CULTURE

## 2021-09-02 MED ORDER — FLUCONAZOLE 150 MG PO TABS: ORAL_TABLET | ORAL | 0 refills | Status: AC

## 2021-09-02 MED ORDER — CIPROFLOXACIN HCL 500 MG PO TABS: 500.0000 mg | ORAL_TABLET | Freq: Two times a day (BID) | ORAL | 0 refills | Status: AC

## 2021-10-21 DIAGNOSIS — F319 Bipolar disorder, unspecified: Secondary | ICD-10-CM | POA: Diagnosis not present

## 2021-10-21 DIAGNOSIS — L930 Discoid lupus erythematosus: Secondary | ICD-10-CM | POA: Diagnosis not present

## 2021-10-21 DIAGNOSIS — I10 Essential (primary) hypertension: Secondary | ICD-10-CM | POA: Diagnosis not present

## 2021-10-25 DIAGNOSIS — F319 Bipolar disorder, unspecified: Secondary | ICD-10-CM | POA: Diagnosis not present

## 2021-10-25 DIAGNOSIS — L930 Discoid lupus erythematosus: Secondary | ICD-10-CM | POA: Diagnosis not present

## 2021-10-25 DIAGNOSIS — I10 Essential (primary) hypertension: Secondary | ICD-10-CM | POA: Diagnosis not present

## 2021-11-04 DIAGNOSIS — L930 Discoid lupus erythematosus: Secondary | ICD-10-CM | POA: Diagnosis not present

## 2021-11-04 DIAGNOSIS — I10 Essential (primary) hypertension: Secondary | ICD-10-CM | POA: Diagnosis not present

## 2021-11-04 DIAGNOSIS — F319 Bipolar disorder, unspecified: Secondary | ICD-10-CM | POA: Diagnosis not present

## 2021-11-09 DIAGNOSIS — L930 Discoid lupus erythematosus: Secondary | ICD-10-CM | POA: Diagnosis not present

## 2021-11-09 DIAGNOSIS — F319 Bipolar disorder, unspecified: Secondary | ICD-10-CM | POA: Diagnosis not present

## 2021-11-09 DIAGNOSIS — I10 Essential (primary) hypertension: Secondary | ICD-10-CM | POA: Diagnosis not present

## 2021-11-16 DIAGNOSIS — I10 Essential (primary) hypertension: Secondary | ICD-10-CM | POA: Diagnosis not present

## 2021-11-16 DIAGNOSIS — L930 Discoid lupus erythematosus: Secondary | ICD-10-CM | POA: Diagnosis not present

## 2021-11-16 DIAGNOSIS — F319 Bipolar disorder, unspecified: Secondary | ICD-10-CM | POA: Diagnosis not present

## 2021-11-23 DIAGNOSIS — L930 Discoid lupus erythematosus: Secondary | ICD-10-CM | POA: Diagnosis not present

## 2021-11-23 DIAGNOSIS — F319 Bipolar disorder, unspecified: Secondary | ICD-10-CM | POA: Diagnosis not present

## 2021-11-23 DIAGNOSIS — I10 Essential (primary) hypertension: Secondary | ICD-10-CM | POA: Diagnosis not present

## 2021-12-09 DIAGNOSIS — L930 Discoid lupus erythematosus: Secondary | ICD-10-CM | POA: Diagnosis not present

## 2021-12-09 DIAGNOSIS — F319 Bipolar disorder, unspecified: Secondary | ICD-10-CM | POA: Diagnosis not present

## 2021-12-09 DIAGNOSIS — I10 Essential (primary) hypertension: Secondary | ICD-10-CM | POA: Diagnosis not present

## 2021-12-17 DIAGNOSIS — F319 Bipolar disorder, unspecified: Secondary | ICD-10-CM | POA: Diagnosis not present

## 2021-12-17 DIAGNOSIS — L930 Discoid lupus erythematosus: Secondary | ICD-10-CM | POA: Diagnosis not present

## 2021-12-17 DIAGNOSIS — I10 Essential (primary) hypertension: Secondary | ICD-10-CM | POA: Diagnosis not present

## 2021-12-18 DIAGNOSIS — I10 Essential (primary) hypertension: Secondary | ICD-10-CM | POA: Diagnosis not present

## 2021-12-18 DIAGNOSIS — F319 Bipolar disorder, unspecified: Secondary | ICD-10-CM | POA: Diagnosis not present

## 2021-12-18 DIAGNOSIS — L930 Discoid lupus erythematosus: Secondary | ICD-10-CM | POA: Diagnosis not present

## 2021-12-22 DIAGNOSIS — L930 Discoid lupus erythematosus: Secondary | ICD-10-CM | POA: Diagnosis not present

## 2021-12-22 DIAGNOSIS — I10 Essential (primary) hypertension: Secondary | ICD-10-CM | POA: Diagnosis not present

## 2021-12-22 DIAGNOSIS — F319 Bipolar disorder, unspecified: Secondary | ICD-10-CM | POA: Diagnosis not present

## 2021-12-23 ENCOUNTER — Ambulatory Visit: Payer: Medicaid Other | Admitting: Family Medicine

## 2021-12-28 DIAGNOSIS — I10 Essential (primary) hypertension: Secondary | ICD-10-CM | POA: Diagnosis not present

## 2021-12-28 DIAGNOSIS — L930 Discoid lupus erythematosus: Secondary | ICD-10-CM | POA: Diagnosis not present

## 2021-12-28 DIAGNOSIS — F319 Bipolar disorder, unspecified: Secondary | ICD-10-CM | POA: Diagnosis not present

## 2022-01-05 DIAGNOSIS — L930 Discoid lupus erythematosus: Secondary | ICD-10-CM | POA: Diagnosis not present

## 2022-01-05 DIAGNOSIS — I10 Essential (primary) hypertension: Secondary | ICD-10-CM | POA: Diagnosis not present

## 2022-01-05 DIAGNOSIS — F319 Bipolar disorder, unspecified: Secondary | ICD-10-CM | POA: Diagnosis not present

## 2022-01-11 DIAGNOSIS — I10 Essential (primary) hypertension: Secondary | ICD-10-CM | POA: Diagnosis not present

## 2022-01-12 DIAGNOSIS — F319 Bipolar disorder, unspecified: Secondary | ICD-10-CM | POA: Diagnosis not present

## 2022-01-12 DIAGNOSIS — I10 Essential (primary) hypertension: Secondary | ICD-10-CM | POA: Diagnosis not present

## 2022-01-12 DIAGNOSIS — L930 Discoid lupus erythematosus: Secondary | ICD-10-CM | POA: Diagnosis not present

## 2022-01-18 DIAGNOSIS — I10 Essential (primary) hypertension: Secondary | ICD-10-CM | POA: Diagnosis not present

## 2022-01-18 DIAGNOSIS — L930 Discoid lupus erythematosus: Secondary | ICD-10-CM | POA: Diagnosis not present

## 2022-01-18 DIAGNOSIS — F319 Bipolar disorder, unspecified: Secondary | ICD-10-CM | POA: Diagnosis not present

## 2022-01-23 DIAGNOSIS — R300 Dysuria: Secondary | ICD-10-CM | POA: Diagnosis not present

## 2022-01-23 DIAGNOSIS — R350 Frequency of micturition: Secondary | ICD-10-CM | POA: Diagnosis not present

## 2022-01-25 DIAGNOSIS — I10 Essential (primary) hypertension: Secondary | ICD-10-CM | POA: Diagnosis not present

## 2022-01-25 DIAGNOSIS — F319 Bipolar disorder, unspecified: Secondary | ICD-10-CM | POA: Diagnosis not present

## 2022-01-25 DIAGNOSIS — L930 Discoid lupus erythematosus: Secondary | ICD-10-CM | POA: Diagnosis not present

## 2022-02-01 DIAGNOSIS — L930 Discoid lupus erythematosus: Secondary | ICD-10-CM | POA: Diagnosis not present

## 2022-02-01 DIAGNOSIS — F319 Bipolar disorder, unspecified: Secondary | ICD-10-CM | POA: Diagnosis not present

## 2022-02-01 DIAGNOSIS — I10 Essential (primary) hypertension: Secondary | ICD-10-CM | POA: Diagnosis not present

## 2022-02-08 DIAGNOSIS — F319 Bipolar disorder, unspecified: Secondary | ICD-10-CM | POA: Diagnosis not present

## 2022-02-08 DIAGNOSIS — I10 Essential (primary) hypertension: Secondary | ICD-10-CM | POA: Diagnosis not present

## 2022-02-15 DIAGNOSIS — F319 Bipolar disorder, unspecified: Secondary | ICD-10-CM | POA: Diagnosis not present

## 2022-02-15 DIAGNOSIS — I10 Essential (primary) hypertension: Secondary | ICD-10-CM | POA: Diagnosis not present

## 2022-02-22 DIAGNOSIS — L930 Discoid lupus erythematosus: Secondary | ICD-10-CM | POA: Diagnosis not present

## 2022-02-22 DIAGNOSIS — F319 Bipolar disorder, unspecified: Secondary | ICD-10-CM | POA: Diagnosis not present

## 2022-02-22 DIAGNOSIS — I10 Essential (primary) hypertension: Secondary | ICD-10-CM | POA: Diagnosis not present

## 2022-03-02 DIAGNOSIS — I10 Essential (primary) hypertension: Secondary | ICD-10-CM | POA: Diagnosis not present

## 2022-03-02 DIAGNOSIS — L930 Discoid lupus erythematosus: Secondary | ICD-10-CM | POA: Diagnosis not present

## 2022-03-02 DIAGNOSIS — F319 Bipolar disorder, unspecified: Secondary | ICD-10-CM | POA: Diagnosis not present

## 2022-03-08 DIAGNOSIS — I10 Essential (primary) hypertension: Secondary | ICD-10-CM | POA: Diagnosis not present

## 2022-03-08 DIAGNOSIS — L930 Discoid lupus erythematosus: Secondary | ICD-10-CM | POA: Diagnosis not present

## 2022-03-08 DIAGNOSIS — F319 Bipolar disorder, unspecified: Secondary | ICD-10-CM | POA: Diagnosis not present

## 2022-03-29 DIAGNOSIS — L930 Discoid lupus erythematosus: Secondary | ICD-10-CM | POA: Diagnosis not present

## 2022-03-29 DIAGNOSIS — F319 Bipolar disorder, unspecified: Secondary | ICD-10-CM | POA: Diagnosis not present

## 2022-03-29 DIAGNOSIS — I10 Essential (primary) hypertension: Secondary | ICD-10-CM | POA: Diagnosis not present

## 2022-03-30 DIAGNOSIS — I10 Essential (primary) hypertension: Secondary | ICD-10-CM | POA: Diagnosis not present

## 2022-03-30 DIAGNOSIS — Z79899 Other long term (current) drug therapy: Secondary | ICD-10-CM | POA: Diagnosis not present

## 2022-03-30 DIAGNOSIS — G894 Chronic pain syndrome: Secondary | ICD-10-CM | POA: Diagnosis not present

## 2022-03-30 DIAGNOSIS — K219 Gastro-esophageal reflux disease without esophagitis: Secondary | ICD-10-CM | POA: Diagnosis not present

## 2022-03-30 DIAGNOSIS — M797 Fibromyalgia: Secondary | ICD-10-CM | POA: Diagnosis not present

## 2022-03-30 DIAGNOSIS — F319 Bipolar disorder, unspecified: Secondary | ICD-10-CM | POA: Diagnosis not present

## 2022-03-30 DIAGNOSIS — M329 Systemic lupus erythematosus, unspecified: Secondary | ICD-10-CM | POA: Diagnosis not present

## 2022-04-12 DIAGNOSIS — M79605 Pain in left leg: Secondary | ICD-10-CM | POA: Diagnosis not present

## 2022-04-12 DIAGNOSIS — R252 Cramp and spasm: Secondary | ICD-10-CM | POA: Diagnosis not present

## 2022-04-27 ENCOUNTER — Other Ambulatory Visit: Payer: Self-pay | Admitting: Nurse Practitioner

## 2022-04-27 ENCOUNTER — Other Ambulatory Visit (HOSPITAL_COMMUNITY): Payer: Self-pay | Admitting: Nurse Practitioner

## 2022-04-27 DIAGNOSIS — R252 Cramp and spasm: Secondary | ICD-10-CM

## 2022-04-27 DIAGNOSIS — M79605 Pain in left leg: Secondary | ICD-10-CM

## 2022-05-18 DIAGNOSIS — M329 Systemic lupus erythematosus, unspecified: Secondary | ICD-10-CM | POA: Diagnosis not present

## 2022-05-18 DIAGNOSIS — Z79899 Other long term (current) drug therapy: Secondary | ICD-10-CM | POA: Diagnosis not present

## 2022-05-18 DIAGNOSIS — Z13228 Encounter for screening for other metabolic disorders: Secondary | ICD-10-CM | POA: Diagnosis not present

## 2022-05-24 ENCOUNTER — Ambulatory Visit (HOSPITAL_COMMUNITY)
Admission: RE | Admit: 2022-05-24 | Discharge: 2022-05-24 | Disposition: A | Discharge status: Home / Self Care | Payer: Medicaid Other | Source: Ambulatory Visit | Attending: Nurse Practitioner | Admitting: Nurse Practitioner

## 2022-05-24 DIAGNOSIS — M79605 Pain in left leg: Secondary | ICD-10-CM | POA: Insufficient documentation

## 2022-05-24 DIAGNOSIS — R252 Cramp and spasm: Secondary | ICD-10-CM | POA: Insufficient documentation

## 2022-05-24 DIAGNOSIS — M79662 Pain in left lower leg: Secondary | ICD-10-CM | POA: Diagnosis not present

## 2022-05-24 DIAGNOSIS — I10 Essential (primary) hypertension: Secondary | ICD-10-CM | POA: Diagnosis not present

## 2022-05-24 DIAGNOSIS — L930 Discoid lupus erythematosus: Secondary | ICD-10-CM | POA: Diagnosis not present

## 2022-05-24 DIAGNOSIS — K121 Other forms of stomatitis: Secondary | ICD-10-CM | POA: Diagnosis not present

## 2022-05-24 DIAGNOSIS — F319 Bipolar disorder, unspecified: Secondary | ICD-10-CM | POA: Diagnosis not present

## 2022-05-26 DIAGNOSIS — K121 Other forms of stomatitis: Secondary | ICD-10-CM | POA: Diagnosis not present

## 2022-05-26 DIAGNOSIS — M329 Systemic lupus erythematosus, unspecified: Secondary | ICD-10-CM | POA: Diagnosis not present

## 2022-05-26 DIAGNOSIS — I10 Essential (primary) hypertension: Secondary | ICD-10-CM | POA: Diagnosis not present

## 2022-06-02 DIAGNOSIS — M79605 Pain in left leg: Secondary | ICD-10-CM | POA: Diagnosis not present

## 2022-06-02 DIAGNOSIS — K121 Other forms of stomatitis: Secondary | ICD-10-CM | POA: Diagnosis not present

## 2022-06-02 DIAGNOSIS — L930 Discoid lupus erythematosus: Secondary | ICD-10-CM | POA: Diagnosis not present

## 2022-06-02 DIAGNOSIS — F319 Bipolar disorder, unspecified: Secondary | ICD-10-CM | POA: Diagnosis not present

## 2022-06-07 DIAGNOSIS — F319 Bipolar disorder, unspecified: Secondary | ICD-10-CM | POA: Diagnosis not present

## 2022-06-07 DIAGNOSIS — I10 Essential (primary) hypertension: Secondary | ICD-10-CM | POA: Diagnosis not present

## 2022-06-07 DIAGNOSIS — K121 Other forms of stomatitis: Secondary | ICD-10-CM | POA: Diagnosis not present

## 2022-06-07 DIAGNOSIS — L930 Discoid lupus erythematosus: Secondary | ICD-10-CM | POA: Diagnosis not present

## 2022-06-07 DIAGNOSIS — M79605 Pain in left leg: Secondary | ICD-10-CM | POA: Diagnosis not present

## 2022-06-21 DIAGNOSIS — L930 Discoid lupus erythematosus: Secondary | ICD-10-CM | POA: Diagnosis not present

## 2022-06-21 DIAGNOSIS — F319 Bipolar disorder, unspecified: Secondary | ICD-10-CM | POA: Diagnosis not present

## 2022-06-21 DIAGNOSIS — K121 Other forms of stomatitis: Secondary | ICD-10-CM | POA: Diagnosis not present

## 2022-06-21 DIAGNOSIS — I10 Essential (primary) hypertension: Secondary | ICD-10-CM | POA: Diagnosis not present

## 2022-06-21 DIAGNOSIS — M79605 Pain in left leg: Secondary | ICD-10-CM | POA: Diagnosis not present

## 2022-06-25 DIAGNOSIS — M549 Dorsalgia, unspecified: Secondary | ICD-10-CM | POA: Diagnosis not present

## 2022-06-25 DIAGNOSIS — Z882 Allergy status to sulfonamides status: Secondary | ICD-10-CM | POA: Diagnosis not present

## 2022-06-25 DIAGNOSIS — M545 Low back pain, unspecified: Secondary | ICD-10-CM | POA: Diagnosis not present

## 2022-06-25 DIAGNOSIS — M546 Pain in thoracic spine: Secondary | ICD-10-CM | POA: Diagnosis not present

## 2022-06-25 DIAGNOSIS — Z886 Allergy status to analgesic agent status: Secondary | ICD-10-CM | POA: Diagnosis not present

## 2022-06-28 DIAGNOSIS — K121 Other forms of stomatitis: Secondary | ICD-10-CM | POA: Diagnosis not present

## 2022-06-28 DIAGNOSIS — Z0000 Encounter for general adult medical examination without abnormal findings: Secondary | ICD-10-CM | POA: Diagnosis not present

## 2022-06-28 DIAGNOSIS — M3219 Other organ or system involvement in systemic lupus erythematosus: Secondary | ICD-10-CM | POA: Diagnosis not present

## 2022-06-28 DIAGNOSIS — I10 Essential (primary) hypertension: Secondary | ICD-10-CM | POA: Diagnosis not present

## 2022-07-13 DIAGNOSIS — L930 Discoid lupus erythematosus: Secondary | ICD-10-CM | POA: Diagnosis not present

## 2022-07-13 DIAGNOSIS — I10 Essential (primary) hypertension: Secondary | ICD-10-CM | POA: Diagnosis not present

## 2022-07-13 DIAGNOSIS — K121 Other forms of stomatitis: Secondary | ICD-10-CM | POA: Diagnosis not present

## 2022-07-13 DIAGNOSIS — F319 Bipolar disorder, unspecified: Secondary | ICD-10-CM | POA: Diagnosis not present

## 2022-07-13 DIAGNOSIS — M79605 Pain in left leg: Secondary | ICD-10-CM | POA: Diagnosis not present

## 2022-07-20 ENCOUNTER — Telehealth: Payer: Self-pay

## 2022-07-20 NOTE — Telephone Encounter (Signed)
I TIRED CALLING PT MULTI TIMES LEAVING VOICES AND SENDING MYCHART MESSAGES TO CALL THE OFFICE TO SCHEDULE APPT FOR A REFERRAL THAT WAS RECEIVED. NO RESPONDS FROM THE PATIENT REFERRAL HAS BEEN CLOSED.

## 2022-07-26 DIAGNOSIS — I10 Essential (primary) hypertension: Secondary | ICD-10-CM | POA: Diagnosis not present

## 2022-07-26 DIAGNOSIS — F319 Bipolar disorder, unspecified: Secondary | ICD-10-CM | POA: Diagnosis not present

## 2022-07-26 DIAGNOSIS — K121 Other forms of stomatitis: Secondary | ICD-10-CM | POA: Diagnosis not present

## 2022-07-26 DIAGNOSIS — M79605 Pain in left leg: Secondary | ICD-10-CM | POA: Diagnosis not present

## 2022-07-26 DIAGNOSIS — L930 Discoid lupus erythematosus: Secondary | ICD-10-CM | POA: Diagnosis not present

## 2022-08-09 DIAGNOSIS — Z79899 Other long term (current) drug therapy: Secondary | ICD-10-CM | POA: Diagnosis not present

## 2022-08-09 DIAGNOSIS — Z131 Encounter for screening for diabetes mellitus: Secondary | ICD-10-CM | POA: Diagnosis not present

## 2022-08-09 DIAGNOSIS — I10 Essential (primary) hypertension: Secondary | ICD-10-CM | POA: Diagnosis not present

## 2022-08-09 DIAGNOSIS — Z13220 Encounter for screening for lipoid disorders: Secondary | ICD-10-CM | POA: Diagnosis not present

## 2022-08-09 DIAGNOSIS — Z1329 Encounter for screening for other suspected endocrine disorder: Secondary | ICD-10-CM | POA: Diagnosis not present

## 2022-08-22 DIAGNOSIS — M3219 Other organ or system involvement in systemic lupus erythematosus: Secondary | ICD-10-CM | POA: Diagnosis not present

## 2022-08-22 DIAGNOSIS — I10 Essential (primary) hypertension: Secondary | ICD-10-CM | POA: Diagnosis not present

## 2022-08-22 DIAGNOSIS — R110 Nausea: Secondary | ICD-10-CM | POA: Diagnosis not present

## 2022-08-22 DIAGNOSIS — N390 Urinary tract infection, site not specified: Secondary | ICD-10-CM | POA: Diagnosis not present

## 2022-08-22 DIAGNOSIS — R7303 Prediabetes: Secondary | ICD-10-CM | POA: Diagnosis not present

## 2022-08-23 ENCOUNTER — Ambulatory Visit: Payer: Medicaid Other | Admitting: Obstetrics & Gynecology

## 2022-08-24 DIAGNOSIS — M79605 Pain in left leg: Secondary | ICD-10-CM | POA: Diagnosis not present

## 2022-08-24 DIAGNOSIS — I10 Essential (primary) hypertension: Secondary | ICD-10-CM | POA: Diagnosis not present

## 2022-08-24 DIAGNOSIS — K121 Other forms of stomatitis: Secondary | ICD-10-CM | POA: Diagnosis not present

## 2022-08-24 DIAGNOSIS — L930 Discoid lupus erythematosus: Secondary | ICD-10-CM | POA: Diagnosis not present

## 2022-08-24 DIAGNOSIS — F319 Bipolar disorder, unspecified: Secondary | ICD-10-CM | POA: Diagnosis not present

## 2022-09-06 DIAGNOSIS — M79605 Pain in left leg: Secondary | ICD-10-CM | POA: Diagnosis not present

## 2022-09-06 DIAGNOSIS — K121 Other forms of stomatitis: Secondary | ICD-10-CM | POA: Diagnosis not present

## 2022-09-06 DIAGNOSIS — L930 Discoid lupus erythematosus: Secondary | ICD-10-CM | POA: Diagnosis not present

## 2022-09-06 DIAGNOSIS — I10 Essential (primary) hypertension: Secondary | ICD-10-CM | POA: Diagnosis not present

## 2022-09-06 DIAGNOSIS — F319 Bipolar disorder, unspecified: Secondary | ICD-10-CM | POA: Diagnosis not present

## 2022-09-20 DIAGNOSIS — M79605 Pain in left leg: Secondary | ICD-10-CM | POA: Diagnosis not present

## 2022-09-20 DIAGNOSIS — L930 Discoid lupus erythematosus: Secondary | ICD-10-CM | POA: Diagnosis not present

## 2022-09-20 DIAGNOSIS — K121 Other forms of stomatitis: Secondary | ICD-10-CM | POA: Diagnosis not present

## 2022-09-20 DIAGNOSIS — F319 Bipolar disorder, unspecified: Secondary | ICD-10-CM | POA: Diagnosis not present

## 2022-09-20 DIAGNOSIS — I10 Essential (primary) hypertension: Secondary | ICD-10-CM | POA: Diagnosis not present

## 2022-09-29 ENCOUNTER — Ambulatory Visit (INDEPENDENT_AMBULATORY_CARE_PROVIDER_SITE_OTHER): Payer: Medicaid Other | Admitting: Obstetrics & Gynecology

## 2022-09-29 ENCOUNTER — Other Ambulatory Visit (HOSPITAL_COMMUNITY)
Admission: RE | Admit: 2022-09-29 | Discharge: 2022-09-29 | Disposition: A | Discharge status: Home / Self Care | Payer: Medicaid Other | Source: Ambulatory Visit | Attending: Obstetrics & Gynecology | Admitting: Obstetrics & Gynecology

## 2022-09-29 ENCOUNTER — Encounter: Payer: Self-pay | Admitting: Obstetrics & Gynecology

## 2022-09-29 VITALS — BP 134/84 | HR 76 | Ht 63.0 in | Wt 158.0 lb

## 2022-09-29 DIAGNOSIS — Z113 Encounter for screening for infections with a predominantly sexual mode of transmission: Secondary | ICD-10-CM | POA: Insufficient documentation

## 2022-09-29 DIAGNOSIS — L930 Discoid lupus erythematosus: Secondary | ICD-10-CM

## 2022-09-29 DIAGNOSIS — Z01419 Encounter for gynecological examination (general) (routine) without abnormal findings: Secondary | ICD-10-CM

## 2022-09-29 DIAGNOSIS — Z0000 Encounter for general adult medical examination without abnormal findings: Secondary | ICD-10-CM

## 2022-09-29 NOTE — Addendum Note (Signed)
Addended by: Dereck Ligas on: 09/29/2022 04:28 PM   Modules accepted: Orders

## 2022-09-29 NOTE — Progress Notes (Signed)
Subjective:     Wendy Herman is a 36 y.o. female here for a routine exam.  No LMP recorded. Patient has had an implant. [id number] Birth Control Method:  Nexplanon Menstrual Calendar(currently): irregular on nexplanon  Current complaints: no gyn issues, irregular bleeding.   Current acute medical issues:  SLE   Recent Gynecologic History No LMP recorded. Patient has had an implant. Last Pap: 09/2020  ASCUS +HPV no subtype done Last mammogram: na,    Past Medical History:  Diagnosis Date   Anxiety    Arthritis    Bipolar 1 disorder (HCC)    Bipolar disorder (HCC)    Chronic pain    Depression    Fibromyalgia    Hypertension    'clonidine" for that   Infection    UTI   Lupus (HCC)    Neutropenia (HCC)    Suboxone maintenance treatment complicating pregnancy, antepartum [facility])     Past Surgical History:  Procedure Laterality Date   CESAREAN SECTION     CESAREAN SECTION N/A 04/14/2021   Procedure: CESAREAN SECTION;  Surgeon: Kathrynn Running, MD;  Location: [facility] LD ORS;  Service: Obstetrics;  Laterality: N/A;   WISDOM TOOTH EXTRACTION      OB History     Gravida  3   Para  3   Term  1   Preterm  2   AB  0   Living  2      SAB      IAB      Ectopic      Multiple  0   Live Births  3        Obstetric Comments  #2:C/s "her lupus was attacking the baby"  (CA) #1 Scientific laboratory technician         Social History   Socioeconomic History   Marital status: Married    Spouse name: Not on file   Number of children: Not on file   Years of education: Not on file   Highest education level: Not on file  Occupational History   Not on file  Tobacco Use   Smoking status: Every Day    Packs/day: 0.50    Years: 15.00    Total pack years: 7.50    Types: Cigarettes   Smokeless tobacco: Never  Vaping Use   Vaping Use: Never used  Substance and Sexual Activity   Alcohol use: Yes    Comment: 2-3 times a week   Drug use: Yes    Types: Marijuana    Comment: takes  Suboxone- but not daily last used marijuana 12/2020   Sexual activity: Yes    Birth control/protection: Implant  Other Topics Concern   Not on file  Social History Narrative   Not on file   Social Determinants of Health   Financial Resource Strain: High Risk (09/29/2022)   Overall Financial Resource Strain (CARDIA)    Difficulty of Paying Living Expenses: Very hard  Food Insecurity: Food Insecurity Present (09/29/2022)   Hunger Vital Sign    Worried About Running Out of Food in the Last Year: Sometimes true    Ran Out of Food in the Last Year: Sometimes true  Transportation Needs: Unmet Transportation Needs (09/29/2022)   PRAPARE - Transportation    Lack of Transportation (Medical): Yes    Lack of Transportation (Non-Medical): Yes  Physical Activity: Insufficiently Active (09/29/2022)   Exercise Vital Sign    Days of Exercise per Week: 2 days    Minutes of  Exercise per Session: 30 min  Stress: Stress Concern Present (09/29/2022)   [employer] - Occupational Stress Questionnaire    Feeling of Stress : Rather much  Social Connections: Socially Isolated (09/29/2022)   Social Connection and Isolation Panel [NHANES]    Frequency of Communication with Friends and Family: Once a week    Frequency of Social Gatherings with Friends and Family: Never    Attends Religious Services: Never    Database administratorActive Member of Clubs or Organizations: No    Attends Engineer, structuralClub or Organization Meetings: Never    Marital Status: Married    Family History  Problem Relation Age of Onset   Hypertension Mother    Heart attack Father        In his 2550s. Died from it.   Depression Father    Breast cancer Maternal Aunt    Diabetes Maternal Aunt    Diabetes Maternal Aunt    Pancreatic cancer Maternal Uncle    Rheum arthritis Maternal Grandmother    Lupus Maternal Grandmother    Diabetes Maternal Grandmother    Heart disease Maternal Grandfather    Diabetes Maternal Grandfather    Heart  attack Maternal Grandfather      Current Outpatient Medications:    Biotin 1000 MCG CHEW, Chew 1,000 mcg by mouth daily., Disp: , Rfl:    Buprenorphine HCl-Naloxone HCl (SUBOXONE) 8-2 MG FILM, Place 1 Film under the tongue in the morning and at bedtime., Disp: 56 each, Rfl: 0   CALCIUM-VITAMIN D PO, Take 1 tablet by mouth daily., Disp: , Rfl:    cloNIDine (CATAPRES) 0.3 MG tablet, Take 0.3 mg by mouth daily., Disp: , Rfl:    coconut oil OIL, Apply 1 application topically as needed., Disp: , Rfl: 0   cyclobenzaprine (FLEXERIL) 5 MG tablet, Take 5 mg by mouth every 8 (eight) hours as needed., Disp: , Rfl:    etonogestrel (NEXPLANON) 68 MG IMPL implant, 1 each by Subdermal route once., Disp: , Rfl:    fluconazole (DIFLUCAN) 150 MG tablet, Take 1 tablet by mouth once. Repeat in 3 days if symptoms persist., Disp: 1 tablet, Rfl: 0   hydroxychloroquine (PLAQUENIL) 200 MG tablet, Take 2 tablets (400 mg total) by mouth daily., Disp: 60 tablet, Rfl: 5   labetalol (NORMODYNE) 200 MG tablet, Take 2 tablets (400 mg total) by mouth 2 (two) times daily., Disp: 60 tablet, Rfl: 3   lamoTRIgine (LAMICTAL) 200 MG tablet, Take 1 tablet (200 mg total) by mouth daily., Disp: 30 tablet, Rfl: 5   mirtazapine (REMERON) 15 MG tablet, Take 15 mg by mouth at bedtime., Disp: , Rfl:    Multiple Vitamins-Minerals (EYE VITAMINS & MINERALS PO), Take 1 tablet by mouth daily., Disp: , Rfl:    nicotine (NICODERM CQ - DOSED IN MG/24 HR) 7 mg/24hr patch, Place 1 patch (7 mg total) onto the skin daily., Disp: 28 patch, Rfl: 6   nicotine polacrilex (NICORETTE) 2 MG gum, Take 1 each (2 mg total) by mouth as needed for smoking cessation., Disp: 100 tablet, Rfl: 2   NIFEdipine (ADALAT CC) 60 MG 24 hr tablet, Take 1 tablet (60 mg total) by mouth daily., Disp: 90 tablet, Rfl: 0   Omega-3 Fatty Acids (FISH OIL) 1000 MG CAPS, Take 1,000 mg by mouth daily., Disp: , Rfl:    omeprazole (PRILOSEC) 20 MG capsule, TAKE 2 CAPSULES BY MOUTH TWICE  DAILY AS NEEDED FOR ACID REFLUX., Disp: 120 capsule, Rfl: 3   ondansetron (ZOFRAN-ODT) 4  MG disintegrating tablet, Take 4 mg by mouth 3 (three) times daily as needed., Disp: , Rfl:    polyethylene glycol powder (GLYCOLAX/MIRALAX) 17 GM/SCOOP powder, Take 17 g by mouth daily., Disp: 289 g, Rfl: 3   predniSONE (DELTASONE) 20 MG tablet, Take 1 tablet (20 mg total) by mouth daily. Increase to 40mg  daily with flare, Disp: 90 tablet, Rfl: 6   Prenatal Vit-Fe Fumarate-FA (PREPLUS) 27-1 MG TABS, Take 1 tablet by mouth daily., Disp: 30 tablet, Rfl: 13   traZODone (DESYREL) 100 MG tablet, Take 100 mg by mouth at bedtime as needed., Disp: , Rfl:    ciprofloxacin (CIPRO) 500 MG tablet, Take 1 tablet (500 mg total) by mouth 2 (two) times daily. (Patient not taking: Reported on 09/29/2022), Disp: 10 tablet, Rfl: 0   norethindrone (MICRONOR) 0.35 MG tablet, Take 1 tablet (0.35 mg total) by mouth daily. (Patient not taking: Reported on 09/29/2022), Disp: 84 tablet, Rfl: 3  Review of Systems  Review of Systems  Constitutional: Negative for fever, chills, weight loss, malaise/fatigue and diaphoresis.  HENT: Negative for hearing loss, ear pain, nosebleeds, congestion, sore throat, neck pain, tinnitus and ear discharge.   Eyes: Negative for blurred vision, double vision, photophobia, pain, discharge and redness.  Respiratory: Negative for cough, hemoptysis, sputum production, shortness of breath, wheezing and stridor.   Cardiovascular: Negative for chest pain, palpitations, orthopnea, claudication, leg swelling and PND.  Gastrointestinal: negative for abdominal pain. Negative for heartburn, nausea, vomiting, diarrhea, constipation, blood in stool and melena.  Genitourinary: Negative for dysuria, urgency, frequency, hematuria and flank pain.  Musculoskeletal: Negative for myalgias, back pain, joint pain and falls.  Skin: Negative for itching and rash.  Neurological: Negative for dizziness, tingling, tremors, sensory  change, speech change, focal weakness, seizures, loss of consciousness, weakness and headaches.  Endo/Heme/Allergies: Negative for environmental allergies and polydipsia. Does not bruise/bleed easily.  Psychiatric/Behavioral: Negative for depression, suicidal ideas, hallucinations, memory loss and substance abuse. The patient is not nervous/anxious and does not have insomnia.        Objective:  Blood pressure 134/84, pulse 76, height 5\' 3"  (1.6 m), weight 158 lb (71.7 kg), not currently breastfeeding.   Physical Exam  Vitals reviewed. Constitutional: She is oriented to person, place, and time. She appears well-developed and well-nourished.  HENT:  Head: Normocephalic and atraumatic.        Right Ear: External ear normal.  Left Ear: External ear normal.  Nose: Nose normal.  Mouth/Throat: Oropharynx is clear and moist.  Eyes: Conjunctivae and EOM are normal. Pupils are equal, round, and reactive to light. Right eye exhibits no discharge. Left eye exhibits no discharge. No scleral icterus.  Neck: Normal range of motion. Neck supple. No tracheal deviation present. No thyromegaly present.  Cardiovascular: Normal rate, regular rhythm, normal heart sounds and intact distal pulses.  Exam reveals no gallop and no friction rub.   No murmur heard. Respiratory: Effort normal and breath sounds normal. No respiratory distress. She has no wheezes. She has no rales. She exhibits no tenderness.  GI: Soft. Bowel sounds are normal. She exhibits no distension and no mass. There is no tenderness. There is no rebound and no guarding.  Genitourinary:  Breasts no masses skin changes or nipple changes bilaterally      Vulva is normal without lesions Vagina is pink moist without discharge Cervix normal in appearance and pap is done Uterus is normal size shape and contour Adnexa is negative with normal sized ovaries   Musculoskeletal: Normal range  of motion. She exhibits no edema and no tenderness.   Neurological: She is alert and oriented to person, place, and time. She has normal reflexes. She displays normal reflexes. No cranial nerve deficit. She exhibits normal muscle tone. Coordination normal.  Skin: Skin is warm and dry. No rash noted. No erythema. No pallor.  Psychiatric: She has a normal mood and affect. Her behavior is normal. Judgment and thought content normal.       Medications Ordered at today's visit: No orders of the defined types were placed in this encounter.   Other orders placed at today's visit: No orders of the defined types were placed in this encounter.     Assessment:    Normal Gyn exam.    Plan:    Contraception: Nexplanon. Follow up in: 1 year.    I recommend yearly HPV based cytology in this patient due to her chronic immunosuppression for her SLE  No follow-ups on file.

## 2022-10-05 LAB — CYTOLOGY - PAP
Chlamydia: NEGATIVE
Comment: NEGATIVE
Comment: NEGATIVE
Comment: NEGATIVE
Comment: NORMAL
Diagnosis: NEGATIVE
High risk HPV: NEGATIVE
Neisseria Gonorrhea: NEGATIVE
Trichomonas: NEGATIVE

## 2022-10-06 DIAGNOSIS — F319 Bipolar disorder, unspecified: Secondary | ICD-10-CM | POA: Diagnosis not present

## 2022-10-06 DIAGNOSIS — L930 Discoid lupus erythematosus: Secondary | ICD-10-CM | POA: Diagnosis not present

## 2022-10-06 DIAGNOSIS — K121 Other forms of stomatitis: Secondary | ICD-10-CM | POA: Diagnosis not present

## 2022-10-06 DIAGNOSIS — I10 Essential (primary) hypertension: Secondary | ICD-10-CM | POA: Diagnosis not present

## 2022-10-06 DIAGNOSIS — M79605 Pain in left leg: Secondary | ICD-10-CM | POA: Diagnosis not present

## 2022-11-02 DIAGNOSIS — K121 Other forms of stomatitis: Secondary | ICD-10-CM | POA: Diagnosis not present

## 2022-11-02 DIAGNOSIS — L930 Discoid lupus erythematosus: Secondary | ICD-10-CM | POA: Diagnosis not present

## 2022-11-02 DIAGNOSIS — I10 Essential (primary) hypertension: Secondary | ICD-10-CM | POA: Diagnosis not present

## 2022-11-02 DIAGNOSIS — F319 Bipolar disorder, unspecified: Secondary | ICD-10-CM | POA: Diagnosis not present

## 2022-11-02 DIAGNOSIS — M79605 Pain in left leg: Secondary | ICD-10-CM | POA: Diagnosis not present

## 2022-11-04 ENCOUNTER — Telehealth: Payer: Medicaid Other | Admitting: Physician Assistant

## 2022-11-04 DIAGNOSIS — R3989 Other symptoms and signs involving the genitourinary system: Secondary | ICD-10-CM

## 2022-11-04 MED ORDER — NITROFURANTOIN MONOHYD MACRO 100 MG PO CAPS: 100.0000 mg | ORAL_CAPSULE | Freq: Two times a day (BID) | ORAL | 0 refills | Status: AC

## 2022-11-04 NOTE — Progress Notes (Signed)
Virtual Visit Consent   Wendy Herman, you are scheduled for a virtual visit with a [facility] provider today. Just as with appointments in the office, your consent must be obtained to participate. Your consent will be active for this visit and any virtual visit you may have with one of our providers in the next 365 days. If you have a MyChart account, a copy of this consent can be sent to you electronically.  As this is a virtual visit, video technology does not allow for your provider to perform a traditional examination. This may limit your provider's ability to fully assess your condition. If your provider identifies any concerns that need to be evaluated in person or the need to arrange testing (such as labs, EKG, etc.), we will make arrangements to do so. Although advances in technology are sophisticated, we cannot ensure that it will always work on either your end or our end. If the connection with a video visit is poor, the visit may have to be switched to a telephone visit. With either a video or telephone visit, we are not always able to ensure that we have a secure connection.  By engaging in this virtual visit, you consent to the provision of healthcare and authorize for your insurance to be billed (if applicable) for the services provided during this visit. Depending on your insurance coverage, you may receive a charge related to this service.  I need to obtain your verbal consent now. Are you willing to proceed with your visit today? Wendy Herman has provided verbal consent on 11/04/2022 for a virtual visit (video or telephone). Mar Daring, PA-C  Date: 11/04/2022 12:25 PM  Virtual Visit via Video Note   I, Mar Daring, connected with  Wendy Herman  ([id number], 21-May-1986) on 11/04/22 at 12:15 PM EST by a video-enabled telemedicine application and verified that I am speaking with the correct person using two identifiers.  Location: Patient: Virtual Visit Location  Patient: Home Provider: Virtual Visit Location Provider: Home Office   I discussed the limitations of evaluation and management by telemedicine and the availability of in person appointments. The patient expressed understanding and agreed to proceed.    History of Present Illness: Wendy Herman is a 37 y.o. who identifies as a female who was assigned female at birth, and is being seen today for possible UTI.  HPI: Urinary Tract Infection  This is a new problem. The current episode started in the past 7 days (2-3 days). The problem occurs every urination. The problem has been gradually worsening. The quality of the pain is described as aching and burning. The pain is mild. There has been no fever. Associated symptoms include frequency, hesitancy and urgency. Pertinent negatives include no chills, discharge, flank pain, hematuria, nausea or vomiting. Associated symptoms comments: Cloudy, strong chemical smell. She has tried increased fluids for the symptoms. The treatment provided no relief. Her past medical history is significant for recurrent UTIs.     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   Bipolar affective disorder,  current episode hypomanic ([place]) 12/10/2018   Chest pain 08/27/2018   Anemia 08/27/2018   SLE (systemic lupus erythematosus) ([place]) 08/27/2018    Allergies:  Allergies  Allergen Reactions   Sulfa Antibiotics Swelling    Mouth/facial swelling   Codeine Swelling    "not actually allergic, just  doesn't want to take it   Nsaids Other (See Comments)    Interacts with GERD   Medications:  Current Outpatient Medications:    nitrofurantoin, macrocrystal-monohydrate, (MACROBID) 100 MG capsule, Take 1 capsule (100 mg total) by mouth 2 (two) times daily., Disp: 10 capsule, Rfl: 0   Biotin 1000 MCG CHEW, Chew 1,000 mcg by mouth daily., Disp: , Rfl:    Buprenorphine HCl-Naloxone HCl (SUBOXONE) 8-2 MG FILM, Place 1 Film under the tongue in the morning and at bedtime., Disp: 56 each, Rfl: 0   CALCIUM-VITAMIN D PO, Take 1 tablet by mouth daily., Disp: , Rfl:    ciprofloxacin (CIPRO) 500 MG tablet, Take 1 tablet (500 mg total) by mouth 2 (two) times daily. (Patient not taking: Reported on 09/29/2022), Disp: 10 tablet, Rfl: 0   cloNIDine (CATAPRES) 0.3 MG tablet, Take 0.3 mg by mouth daily., Disp: , Rfl:    coconut oil OIL, Apply 1 application topically as needed., Disp: , Rfl: 0   cyclobenzaprine (FLEXERIL) 5 MG tablet, Take 5 mg by mouth every 8 (eight) hours as needed., Disp: , Rfl:    etonogestrel (NEXPLANON) 68 MG IMPL implant, 1 each by Subdermal route once., Disp: , Rfl:    fluconazole (DIFLUCAN) 150 MG tablet, Take 1 tablet by mouth once. Repeat in 3 days if symptoms persist., Disp: 1 tablet, Rfl: 0   hydroxychloroquine (PLAQUENIL) 200 MG tablet, Take 2 tablets (400 mg total) by mouth daily., Disp: 60 tablet, Rfl: 5   labetalol (NORMODYNE) 200 MG tablet, Take 2 tablets (400 mg total) by mouth 2 (two) times daily., Disp: 60 tablet, Rfl: 3   lamoTRIgine (LAMICTAL) 200 MG tablet, Take 1 tablet (200 mg total) by mouth daily., Disp: 30 tablet, Rfl: 5   mirtazapine (REMERON) 15 MG tablet, Take 15 mg by mouth at bedtime., Disp: , Rfl:    Multiple Vitamins-Minerals (EYE VITAMINS & MINERALS PO), Take 1 tablet by mouth daily., Disp: , Rfl:    nicotine (NICODERM CQ - DOSED IN MG/24 HR) 7 mg/24hr patch, Place 1 patch (7 mg total) onto the skin daily., Disp: 28 patch, Rfl: 6   nicotine polacrilex  (NICORETTE) 2 MG gum, Take 1 each (2 mg total) by mouth as needed for smoking cessation., Disp: 100 tablet, Rfl: 2   NIFEdipine (ADALAT CC) 60 MG 24 hr tablet, Take 1 tablet (60 mg total) by mouth daily., Disp: 90 tablet, Rfl: 0   norethindrone (MICRONOR) 0.35 MG tablet, Take 1 tablet (0.35 mg total) by mouth daily. (Patient not taking: Reported on 09/29/2022), Disp: 84 tablet, Rfl: 3   Omega-3 Fatty Acids (FISH OIL) 1000 MG CAPS, Take 1,000 mg by mouth daily., Disp: , Rfl:    omeprazole (PRILOSEC) 20 MG capsule, TAKE 2 CAPSULES BY MOUTH TWICE DAILY AS NEEDED FOR ACID REFLUX., Disp: 120 capsule, Rfl: 3   ondansetron (ZOFRAN-ODT) 4 MG disintegrating tablet, Take 4 mg by mouth 3 (three) times daily as needed., Disp: , Rfl:    polyethylene glycol powder (GLYCOLAX/MIRALAX) 17 GM/SCOOP powder, Take 17 g by mouth daily., Disp: 289 g, Rfl: 3   predniSONE (DELTASONE) 20 MG tablet, Take 1 tablet (20 mg total) by mouth daily. Increase  to 40mg  daily with flare, Disp: 90 tablet, Rfl: 6   Prenatal Vit-Fe Fumarate-FA (PREPLUS) 27-1 MG TABS, Take 1 tablet by mouth daily., Disp: 30 tablet, Rfl: 13   traZODone (DESYREL) 100 MG tablet, Take 100 mg by mouth at bedtime as needed., Disp: , Rfl:    Observations/Objective: Patient is well-developed, well-nourished in no acute distress.  Resting comfortably at home.  Head is normocephalic, atraumatic.  No labored breathing.  Speech is clear and coherent with logical content.  Patient is alert and oriented at baseline.    Assessment and Plan: 1. Suspected UTI - nitrofurantoin, macrocrystal-monohydrate, (MACROBID) 100 MG capsule; Take 1 capsule (100 mg total) by mouth 2 (two) times daily.  Dispense: 10 capsule; Refill: 0  - Worsening symptoms.  - Will treat empirically with Macrobid - May use AZO for bladder spasms - Continue to push fluids.  - Seek in person evaluation for urine culture if symptoms do not improve or if they worsen.    Follow Up  Instructions: I discussed the assessment and treatment plan with the patient. The patient was provided an opportunity to ask questions and all were answered. The patient agreed with the plan and demonstrated an understanding of the instructions.  A copy of instructions were sent to the patient via MyChart unless otherwise noted below.    The patient was advised to call back or seek an in-person evaluation if the symptoms worsen or if the condition fails to improve as anticipated.  Time:  I spent 10 minutes with the patient via telehealth technology discussing the above problems/concerns.    Mar Daring, PA-C

## 2022-11-04 NOTE — Patient Instructions (Signed)
Rise Mu, thank you for joining Mar Daring, PA-C for today's virtual visit.  While this provider is not your primary care provider (PCP), if your PCP is located in our provider database this encounter information will be shared with them immediately following your visit.   [facility] account gives you access to today's visit and all your visits, tests, and labs performed at [facility] " click here if you don't have a [facility] account or go to [email or web address]  Consent: (Patient) Wendy Herman provided verbal consent for this virtual visit at the beginning of the encounter.  Current Medications:  Current Outpatient Medications:    nitrofurantoin, macrocrystal-monohydrate, (MACROBID) 100 MG capsule, Take 1 capsule (100 mg total) by mouth 2 (two) times daily., Disp: 10 capsule, Rfl: 0   Biotin 1000 MCG CHEW, Chew 1,000 mcg by mouth daily., Disp: , Rfl:    Buprenorphine HCl-Naloxone HCl (SUBOXONE) 8-2 MG FILM, Place 1 Film under the tongue in the morning and at bedtime., Disp: 56 each, Rfl: 0   CALCIUM-VITAMIN D PO, Take 1 tablet by mouth daily., Disp: , Rfl:    ciprofloxacin (CIPRO) 500 MG tablet, Take 1 tablet (500 mg total) by mouth 2 (two) times daily. (Patient not taking: Reported on 09/29/2022), Disp: 10 tablet, Rfl: 0   cloNIDine (CATAPRES) 0.3 MG tablet, Take 0.3 mg by mouth daily., Disp: , Rfl:    coconut oil OIL, Apply 1 application topically as needed., Disp: , Rfl: 0   cyclobenzaprine (FLEXERIL) 5 MG tablet, Take 5 mg by mouth every 8 (eight) hours as needed., Disp: , Rfl:    etonogestrel (NEXPLANON) 68 MG IMPL implant, 1 each by Subdermal route once., Disp: , Rfl:    fluconazole (DIFLUCAN) 150 MG tablet, Take 1 tablet by mouth once. Repeat in 3 days if symptoms persist., Disp: 1 tablet, Rfl: 0   hydroxychloroquine (PLAQUENIL) 200 MG tablet, Take 2 tablets (400 mg total) by mouth daily., Disp: 60 tablet, Rfl: 5   labetalol  (NORMODYNE) 200 MG tablet, Take 2 tablets (400 mg total) by mouth 2 (two) times daily., Disp: 60 tablet, Rfl: 3   lamoTRIgine (LAMICTAL) 200 MG tablet, Take 1 tablet (200 mg total) by mouth daily., Disp: 30 tablet, Rfl: 5   mirtazapine (REMERON) 15 MG tablet, Take 15 mg by mouth at bedtime., Disp: , Rfl:    Multiple Vitamins-Minerals (EYE VITAMINS & MINERALS PO), Take 1 tablet by mouth daily., Disp: , Rfl:    nicotine (NICODERM CQ - DOSED IN MG/24 HR) 7 mg/24hr patch, Place 1 patch (7 mg total) onto the skin daily., Disp: 28 patch, Rfl: 6   nicotine polacrilex (NICORETTE) 2 MG gum, Take 1 each (2 mg total) by mouth as needed for smoking cessation., Disp: 100 tablet, Rfl: 2   NIFEdipine (ADALAT CC) 60 MG 24 hr tablet, Take 1 tablet (60 mg total) by mouth daily., Disp: 90 tablet, Rfl: 0   norethindrone (MICRONOR) 0.35 MG tablet, Take 1 tablet (0.35 mg total) by mouth daily. (Patient not taking: Reported on 09/29/2022), Disp: 84 tablet, Rfl: 3   Omega-3 Fatty Acids (FISH OIL) 1000 MG CAPS, Take 1,000 mg by mouth daily., Disp: , Rfl:    omeprazole (PRILOSEC) 20 MG capsule, TAKE 2 CAPSULES BY MOUTH TWICE DAILY AS NEEDED FOR ACID REFLUX., Disp: 120 capsule, Rfl: 3   ondansetron (ZOFRAN-ODT) 4 MG disintegrating tablet, Take 4 mg by mouth 3 (three) times daily as needed., Disp: , Rfl:  polyethylene glycol powder (GLYCOLAX/MIRALAX) 17 GM/SCOOP powder, Take 17 g by mouth daily., Disp: 289 g, Rfl: 3   predniSONE (DELTASONE) 20 MG tablet, Take 1 tablet (20 mg total) by mouth daily. Increase to 40mg  daily with flare, Disp: 90 tablet, Rfl: 6   Prenatal Vit-Fe Fumarate-FA (PREPLUS) 27-1 MG TABS, Take 1 tablet by mouth daily., Disp: 30 tablet, Rfl: 13   traZODone (DESYREL) 100 MG tablet, Take 100 mg by mouth at bedtime as needed., Disp: , Rfl:    Medications ordered in this encounter:  Meds ordered this encounter  Medications   nitrofurantoin, macrocrystal-monohydrate, (MACROBID) 100 MG capsule    Sig: Take 1  capsule (100 mg total) by mouth 2 (two) times daily.    Dispense:  10 capsule    Refill:  0    Order Specific Question:   Supervising Provider    Answer:   Chase Picket [id number]     *If you need refills on other medications prior to your next appointment, please contact your pharmacy*  Follow-Up: Call back or seek an in-person evaluation if the symptoms worsen or if the condition fails to improve as anticipated.  [place] [phone]  Other Instructions Urinary Tract Infection, Adult  A urinary tract infection (UTI) is an infection of any part of the urinary tract. The urinary tract includes the kidneys, ureters, bladder, and urethra. These organs make, store, and get rid of urine in the body. An upper UTI affects the ureters and kidneys. A lower UTI affects the bladder and urethra. What are the causes? Most urinary tract infections are caused by bacteria in your genital area around your urethra, where urine leaves your body. These bacteria grow and cause inflammation of your urinary tract. What increases the risk? You are more likely to develop this condition if: You have a urinary catheter that stays in place. You are not able to control when you urinate or have a bowel movement (incontinence). You are female and you: Use a spermicide or diaphragm for birth control. Have low estrogen levels. Are pregnant. You have certain genes that increase your risk. You are sexually active. You take antibiotic medicines. You have a condition that causes your flow of urine to slow down, such as: An enlarged prostate, if you are female. Blockage in your urethra. A kidney stone. A nerve condition that affects your bladder control (neurogenic bladder). Not getting enough to drink, or not urinating often. You have certain medical conditions, such as: Diabetes. A weak disease-fighting system (immunesystem). Sickle cell disease. Gout. Spinal cord injury. What are the  signs or symptoms? Symptoms of this condition include: Needing to urinate right away (urgency). Frequent urination. This may include small amounts of urine each time you urinate. Pain or burning with urination. Blood in the urine. Urine that smells bad or unusual. Trouble urinating. Cloudy urine. Vaginal discharge, if you are female. Pain in the abdomen or the lower back. You may also have: Vomiting or a decreased appetite. Confusion. Irritability or tiredness. A fever or chills. Diarrhea. The first symptom in older adults may be confusion. In some cases, they may not have any symptoms until the infection has worsened. How is this diagnosed? This condition is diagnosed based on your medical history and a physical exam. You may also have other tests, including: Urine tests. Blood tests. Tests for STIs (sexually transmitted infections). If you have had more than one UTI, a cystoscopy or imaging studies may be done to determine the cause  of the infections. How is this treated? Treatment for this condition includes: Antibiotic medicine. Over-the-counter medicines to treat discomfort. Drinking enough water to stay hydrated. If you have frequent infections or have other conditions such as a kidney stone, you may need to see a health care provider who specializes in the urinary tract (urologist). In rare cases, urinary tract infections can cause sepsis. Sepsis is a life-threatening condition that occurs when the body responds to an infection. Sepsis is treated in the hospital with IV antibiotics, fluids, and other medicines. Follow these instructions at home:  Medicines Take over-the-counter and prescription medicines only as told by your health care provider. If you were prescribed an antibiotic medicine, take it as told by your health care provider. Do not stop using the antibiotic even if you start to feel better. General instructions Make sure you: Empty your bladder often and  completely. Do not hold urine for long periods of time. Empty your bladder after sex. Wipe from front to back after urinating or having a bowel movement if you are female. Use each tissue only one time when you wipe. Drink enough fluid to keep your urine pale yellow. Keep all follow-up visits. This is important. Contact a health care provider if: Your symptoms do not get better after 1-2 days. Your symptoms go away and then return. Get help right away if: You have severe pain in your back or your lower abdomen. You have a fever or chills. You have nausea or vomiting. Summary A urinary tract infection (UTI) is an infection of any part of the urinary tract, which includes the kidneys, ureters, bladder, and urethra. Most urinary tract infections are caused by bacteria in your genital area. Treatment for this condition often includes antibiotic medicines. If you were prescribed an antibiotic medicine, take it as told by your health care provider. Do not stop using the antibiotic even if you start to feel better. Keep all follow-up visits. This is important. This information is not intended to replace advice given to you by your health care provider. Make sure you discuss any questions you have with your health care provider. Document Revised: 05/01/2020 Document Reviewed: 05/01/2020 Elsevier Patient Education  [place].    If you have been instructed to have an in-person evaluation today at a local Urgent Care facility, please use the link below. It will take you to a list of all of our available [facility], including address, phone number and hours of operation. Please do not delay care.  [facility]  If you or a family member do not have a primary care provider, use the link below to schedule a visit and establish care. When you choose a [facility] primary care physician or advanced practice provider, you gain a long-term partner in health. Find a  Primary Care Provider  Learn more about [facility]'s in-office and virtual care options: [facility]

## 2022-11-07 ENCOUNTER — Other Ambulatory Visit: Payer: Medicaid Other

## 2022-12-02 IMAGING — US US OB COMP LESS 14 WK
1 series · 15 of 28 positions shown · non-contrast
Comparison: None.

CLINICAL DATA: Bleeding.

EXAM:
OBSTETRIC <14 WK ULTRASOUND
TECHNIQUE: Transabdominal ultrasound was performed for evaluation of the
gestation as well as the maternal uterus and adnexal regions.

[Series 1: us ob comp less 14 wk · 15 of 41 slices shown]
[im 1/41]
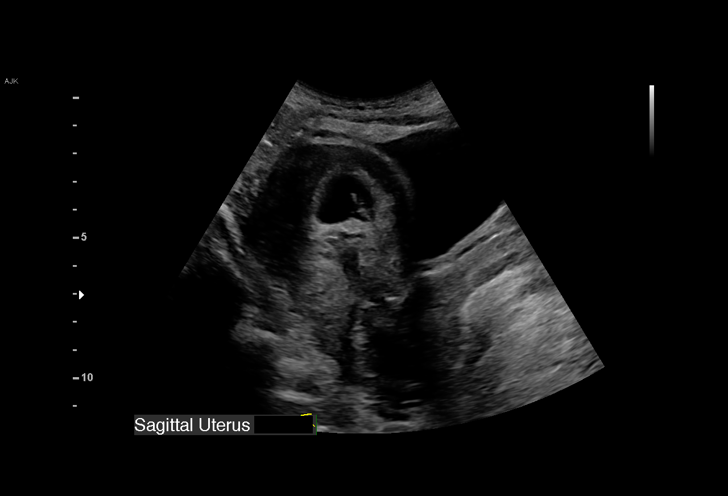
[im 3/41]
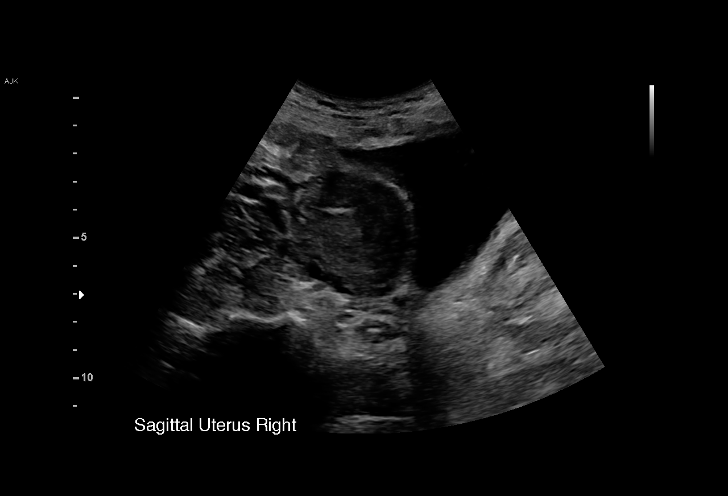
[im 6/41]
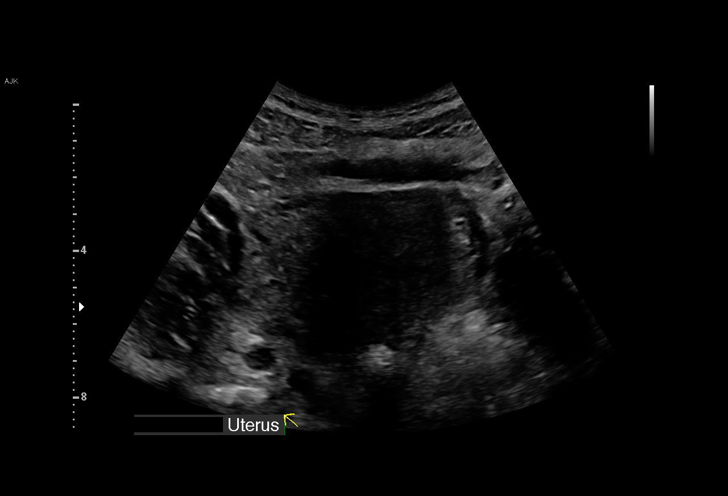
[im 9/41]
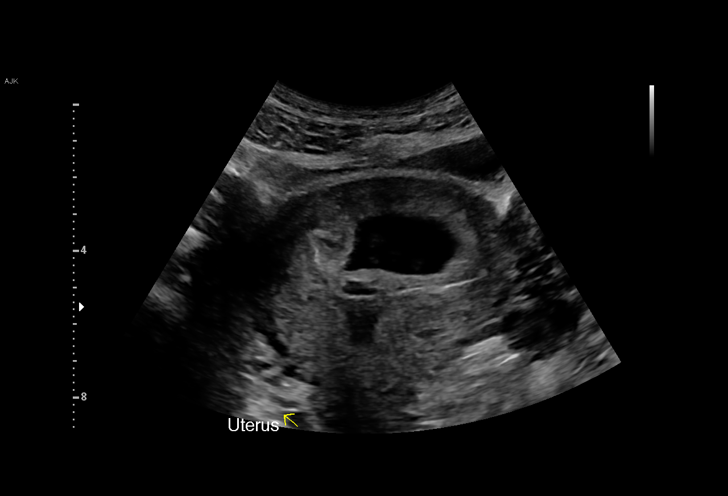
[im 12/41]
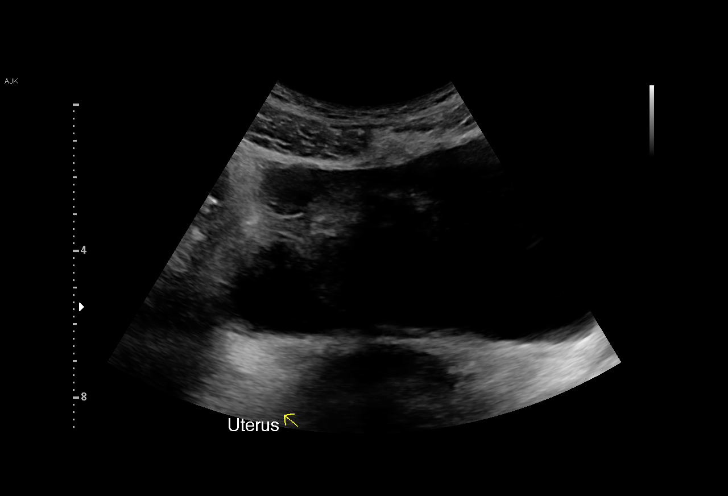
[im 15/41]
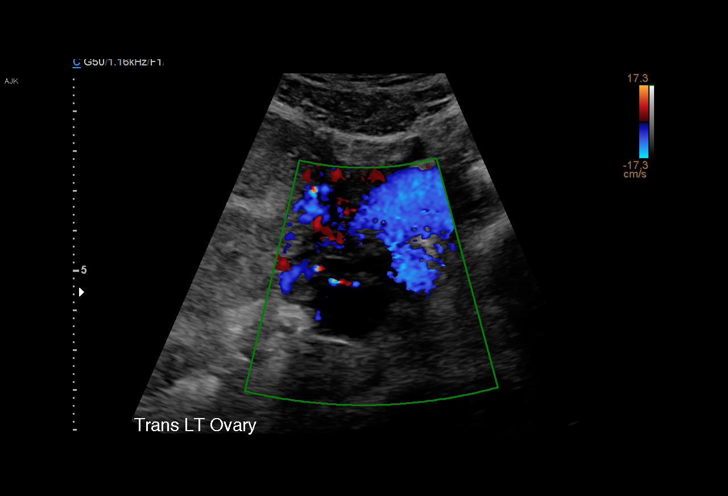
[im 18/41]
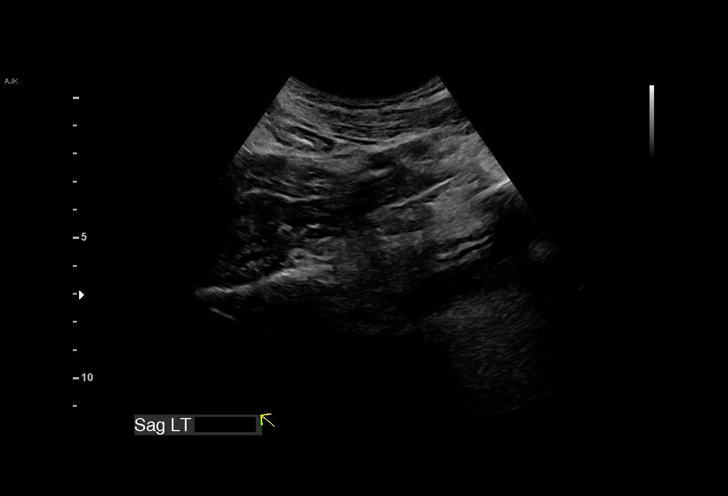
[im 21/41]
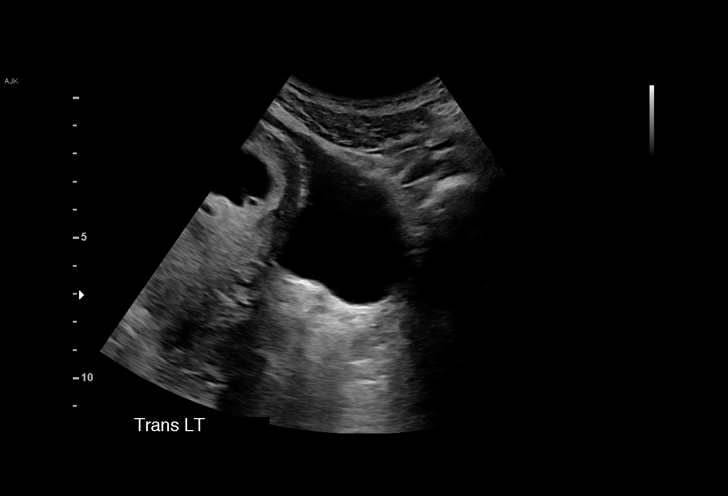
[im 23/41]
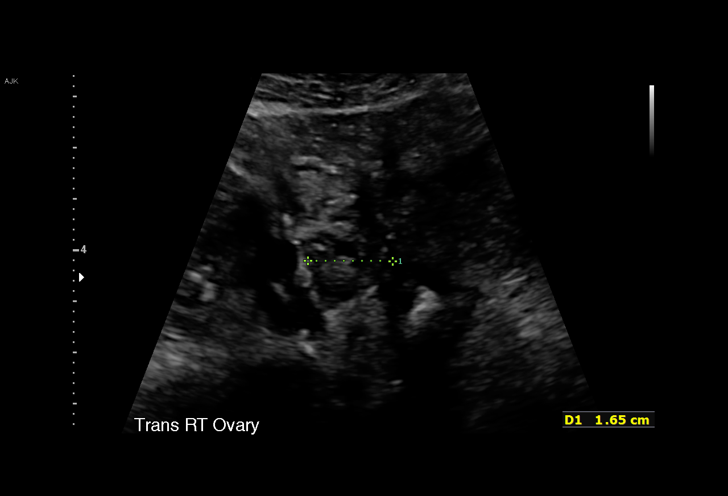
[im 26/41]
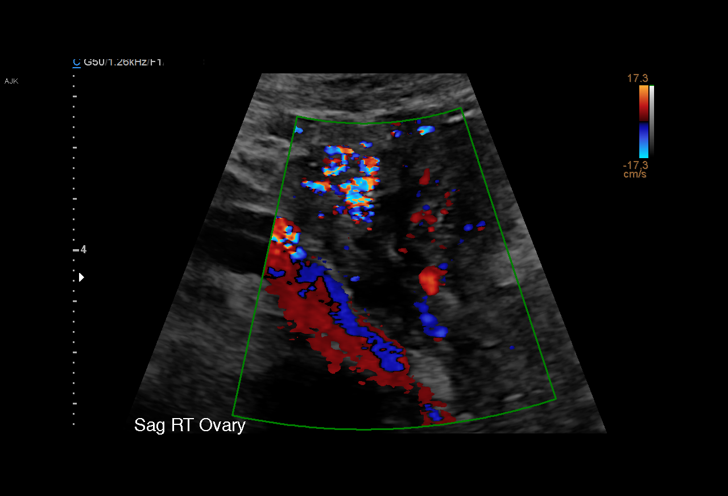
[im 29/41]
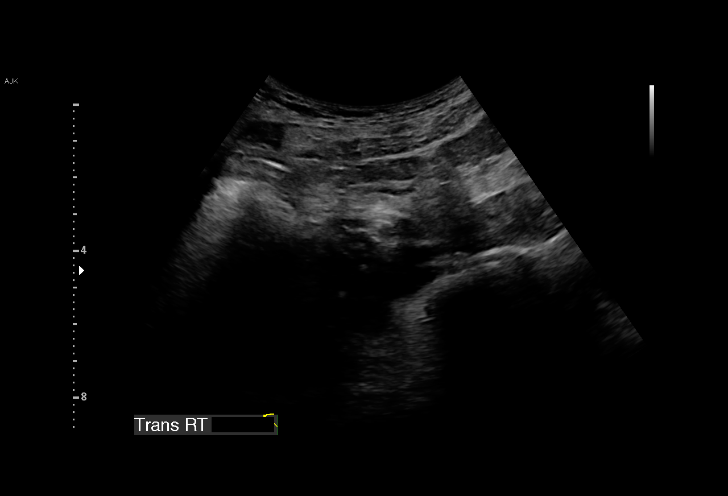
[im 32/41]
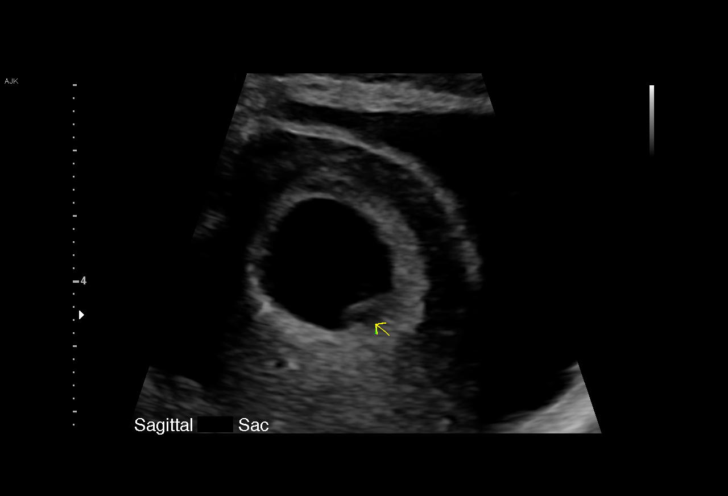
[im 35/41]
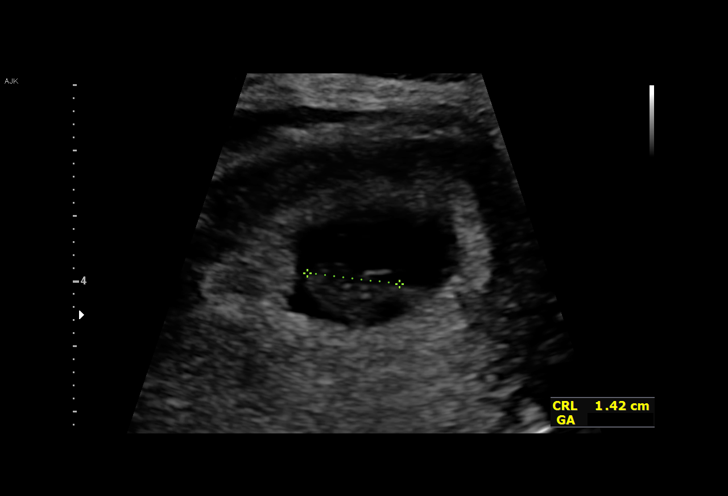
[im 38/41]
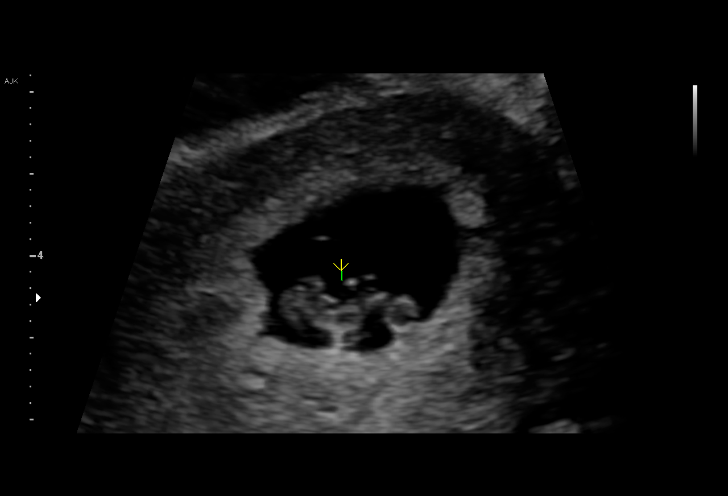
[im 41/41]
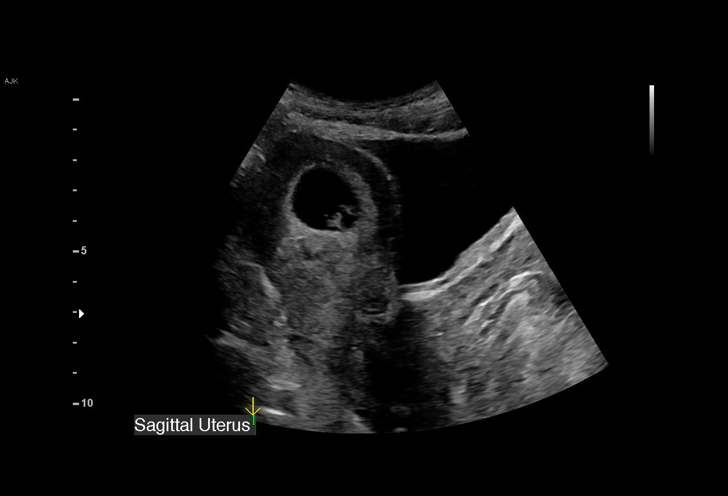

[15 of 28 positions shown; findings below may reference images not displayed]

FINDINGS: Intrauterine gestational sac: Single

Yolk sac:  Visualized.

Embryo:  Visualized.

Cardiac Activity: Visualized.

Heart Rate: 153 bpm

CRL:   13.7 mm   7 w 4 d                  US EDC: 05/06/2021

Subchorionic hemorrhage: There is suggestion of a small volume
subchorionic hemorrhage.

Maternal uterus/adnexae: There is no significant maternal
abnormality detected.
IMPRESSION: 1. Single live IUP at 7 weeks and 4 days.
2. Probable small volume subchorionic hemorrhage.

## 2022-12-28 DIAGNOSIS — M79605 Pain in left leg: Secondary | ICD-10-CM | POA: Diagnosis not present

## 2022-12-28 DIAGNOSIS — I10 Essential (primary) hypertension: Secondary | ICD-10-CM | POA: Diagnosis not present

## 2022-12-28 DIAGNOSIS — K121 Other forms of stomatitis: Secondary | ICD-10-CM | POA: Diagnosis not present

## 2022-12-28 DIAGNOSIS — F319 Bipolar disorder, unspecified: Secondary | ICD-10-CM | POA: Diagnosis not present

## 2022-12-28 DIAGNOSIS — L930 Discoid lupus erythematosus: Secondary | ICD-10-CM | POA: Diagnosis not present

## 2023-01-13 DIAGNOSIS — R7303 Prediabetes: Secondary | ICD-10-CM | POA: Diagnosis not present

## 2023-01-13 DIAGNOSIS — I10 Essential (primary) hypertension: Secondary | ICD-10-CM | POA: Diagnosis not present

## 2023-04-11 IMAGING — US US MFM OB FOLLOW-UP
1 series · 13 of 28 positions shown · non-contrast
Comparison: none

[Series 1: us mfm ob follow-up · 56 acquisitions, 13 frames shown]
[im 3/56]
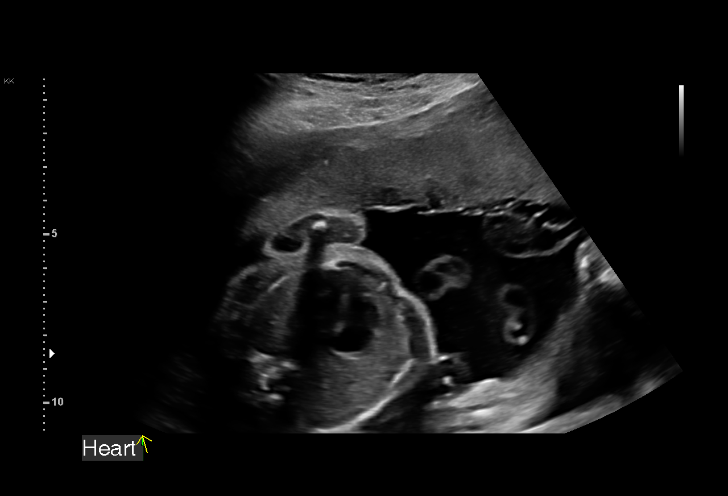
[im 7/56]
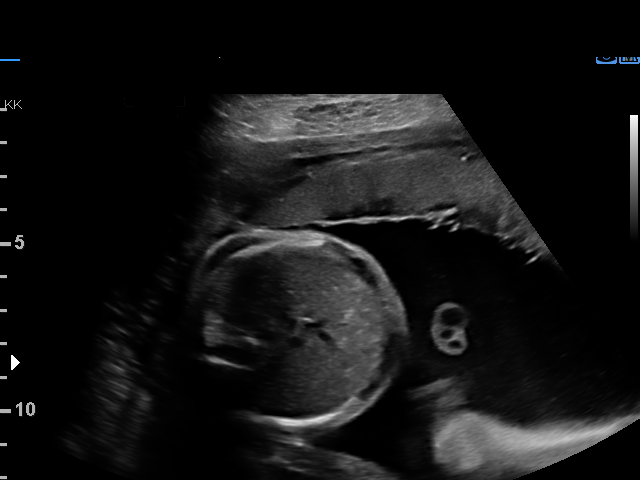
[im 11/56]
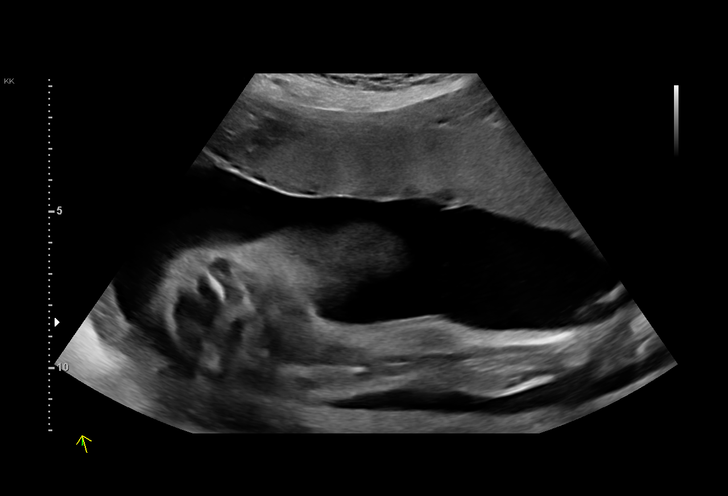
[im 15/56]
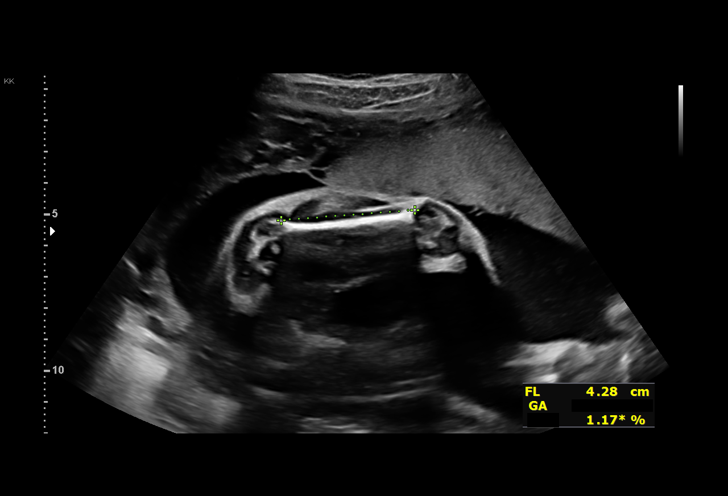
[im 19/56]
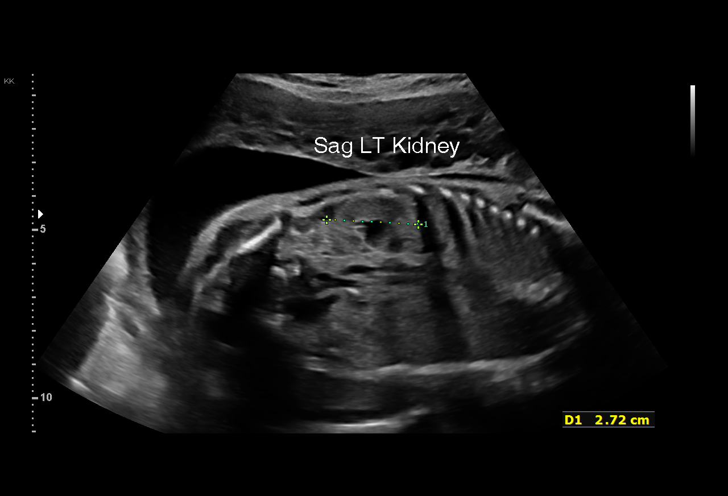
[im 23/56]
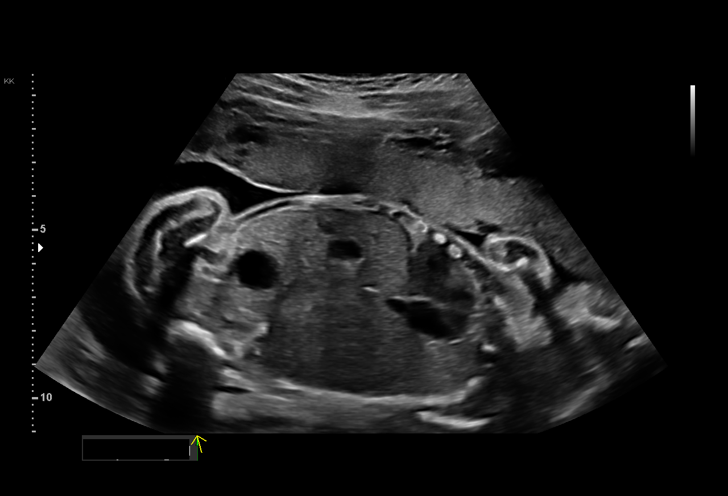
[im 29/56]
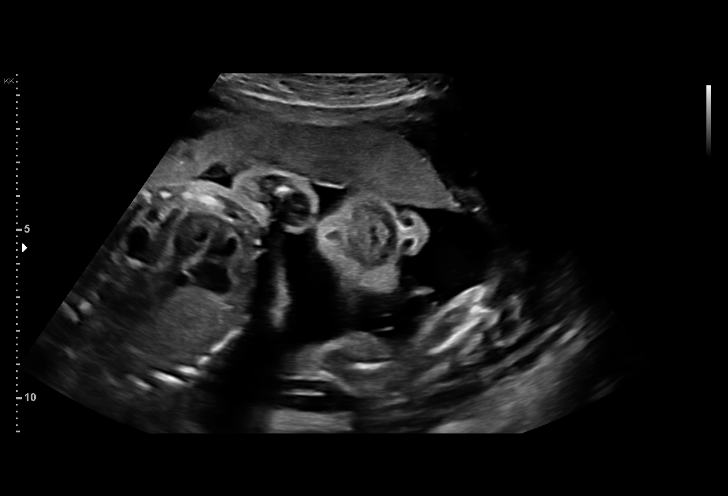
[im 33/56]
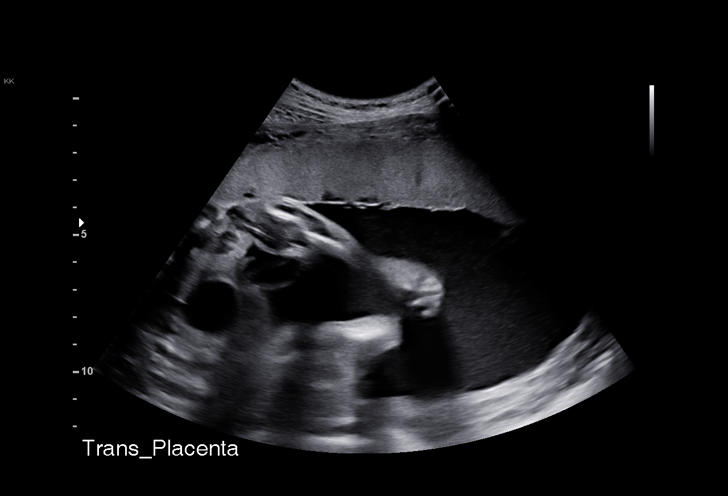
[im 37/56]
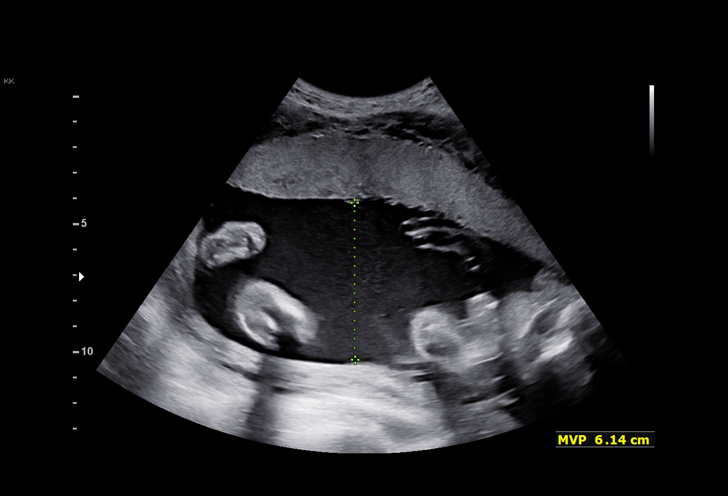
[im 41/56]
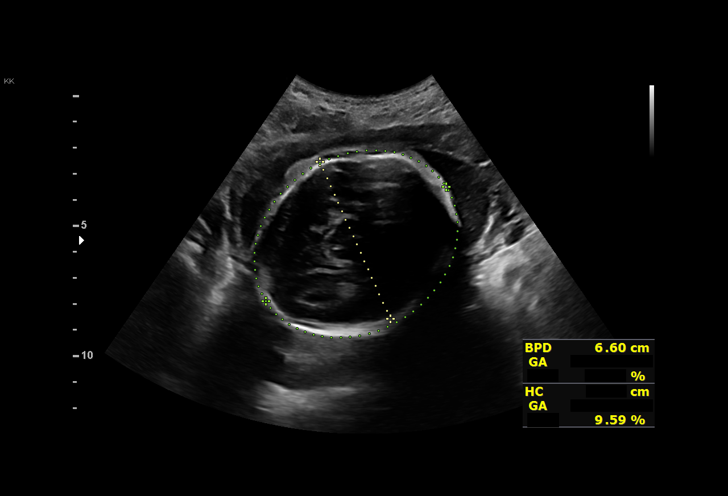
[im 45/56]
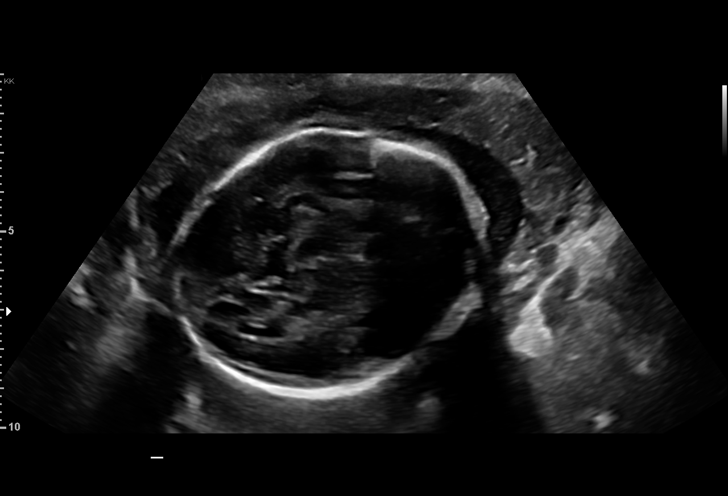
[im 49/56]
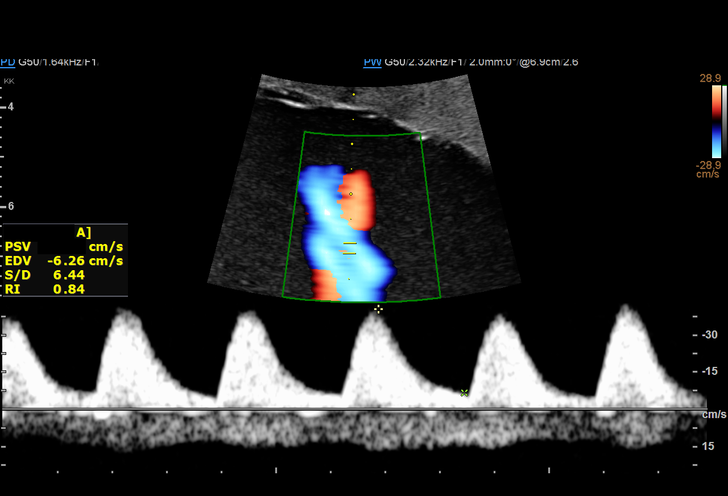
[im 53/56]
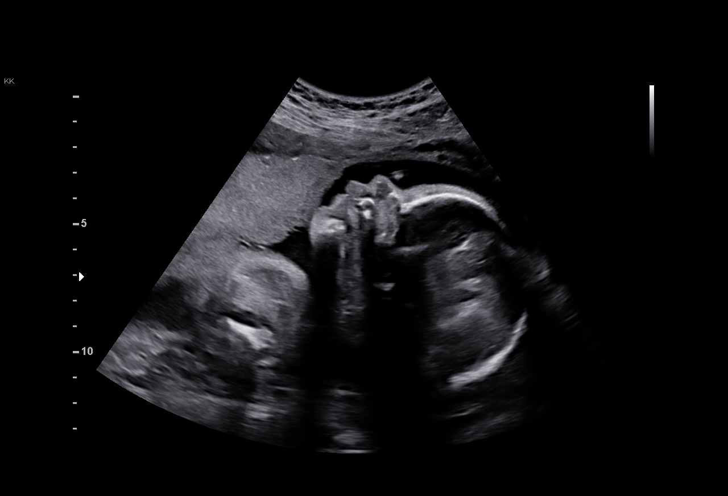

[13 of 28 positions shown; findings below may reference images not displayed]

Indications

 Hypertension - Chronic/Pre-
 existing(Plaquenil, Labetalol)
 Small for gestational age fetus affecting
 management of mother
 26 weeks gestation of pregnancy
 Drug dependence complicating pregnancy,
 antepartum condition or complication -
 Suboxone
 Poor obstetric history (prior pre-term labor) [email or web address]
 2
 History of congenital or genetic condition -
 Monosomy X
 History of cesarean delivery, currently
 pregnant
 Bipolar disease during pregnancy
 Tobacco use complicating pregnancy, third
 trimester
 Systemic lupus complicating pregnancy,         O26.893,
 third trimester
 Encounter for other antenatal screening
 follow-up
 Anemia during pregnancy in third trimester
Fetal Evaluation
 Num Of Fetuses:         1
 Fetal Heart Rate(bpm):  148
 Cardiac Activity:       Observed
 Presentation:           Cephalic
 Placenta:               Anterior
 P. Cord Insertion:      Previously Visualized

 Amniotic Fluid
 AFI FV:      Within normal limits

                             Largest Pocket(cm)

Biometry

 BPD:        65  mm     G. Age:  26w 2d         38  %    CI:        77.28   %    70 - 86
                                                         FL/HC:      18.7   %    18.6 -
 HC:      234.1  mm     G. Age:  25w 3d          7  %    HC/AC:      1.09        1.04 -
 AC:      214.4  mm     G. Age:  26w 0d         30  %    FL/BPD:     67.2   %    71 - 87
 FL:       43.7  mm     G. Age:  24w 2d        2.4  %    FL/AC:      20.4   %    20 - 24

 Est. FW:     798  gm    1 lb 12 oz      10  %
OB History

 Gravidity:    3         Term:   0        Prem:   2
 Living:       1
Gestational Age

 LMP:           26w 2d        Date:  07/29/20                 EDD:   05/05/21
 U/S Today:     25w 4d                                        EDD:   05/10/21
 Best:          26w 2d     Det. By:  LMP  (07/29/20)          EDD:   05/05/21
Anatomy

 Cranium:               Appears normal         LVOT:                   Previously seen
 Cavum:                 Previously seen        Aortic Arch:            Previously seen
 Ventricles:            Appears normal         Ductal Arch:            Previously seen
 Choroid Plexus:        Previously seen        Diaphragm:              Appears normal
 Cerebellum:            Previously seen        Stomach:                Appears normal, left
                                                                       sided
 Posterior Fossa:       Previously seen        Abdomen:                Appears normal
 Nuchal Fold:           Previously seen        Abdominal Wall:         Previously seen
 Face:                  Appears normal         Cord Vessels:           Previously seen
                        (orbits and profile)
 Lips:                  Appears normal         Kidneys:                Appear normal
 Palate:                Appears normal         Bladder:                Appears normal
 Thoracic:              Appears normal         Spine:                  Previously seen
 Heart:                 Appears normal         Upper Extremities:      Previously seen
                        (4CH, axis, and
                        situs)
 RVOT:                  Previously seen        Lower Extremities:      Previously seen

 Other:  Fetus appears to be female. Heels/feet, open hands/5th digits,
         Lenses, VC, 3VV, 3VTV, and Nasal bone previously visualized.
Doppler - Fetal Vessels
 Umbilical Artery
  S/D     %tile      RI    %tile      PI    %tile            ADFV    RDFV
  4.23       89    0.81       96     1.4       95               No      No

Cervix Uterus Adnexa

 Cervix
 Not visualized (advanced GA >46wks)
Comments

 This patient was seen for a follow up growth scan due to
 maternal lupus.  She said that she was recently started on
 labetalol 200 mg twice a day as her blood pressures have
 been elevated.  She has not started taking the labetalol yet.
 Her blood pressure in our office today was 155/79.
 She was informed that the fetal growth measures small for
 her gestational age (10th percentile).  There was normal
 amniotic fluid noted.
 Doppler studies of the umbilical arteries performed today due
 to borderline fetal growth restriction showed a normal S/D
 ratio of 4.23.  There were no signs of absent or reversed end-
 diastolic flow noted.
 The patient was encouraged to take her blood pressure
 medicine as prescribed in order to keep her blood pressures
 under control.  She was advised regarding the increased risk
 of preeclampsia due to chronic hypertension.
 Due to borderline fetal growth restriction, another umbilical
 artery Doppler study and nonstress test was scheduled in 2
 weeks.  We will reassess the fetal growth in 3 weeks.

## 2023-04-26 ENCOUNTER — Other Ambulatory Visit: Payer: Self-pay | Admitting: Gastroenterology
